# Patient Record
Sex: Male | Born: 1950 | Race: White | Hispanic: No | Smoking: Former smoker
Health system: Southern US, Community
[De-identification: ages and names within clinical notes are randomized; demographics above are authoritative.]

## PROBLEM LIST (undated history)

## (undated) DIAGNOSIS — Z5189 Encounter for other specified aftercare: Secondary | ICD-10-CM

## (undated) DIAGNOSIS — K635 Polyp of colon: Secondary | ICD-10-CM

## (undated) DIAGNOSIS — M109 Gout, unspecified: Secondary | ICD-10-CM

## (undated) DIAGNOSIS — Z720 Tobacco use: Secondary | ICD-10-CM

## (undated) DIAGNOSIS — I10 Essential (primary) hypertension: Secondary | ICD-10-CM

## (undated) DIAGNOSIS — N400 Enlarged prostate without lower urinary tract symptoms: Secondary | ICD-10-CM

## (undated) DIAGNOSIS — H269 Unspecified cataract: Secondary | ICD-10-CM

## (undated) DIAGNOSIS — IMO0002 Reserved for concepts with insufficient information to code with codable children: Secondary | ICD-10-CM

## (undated) DIAGNOSIS — E1142 Type 2 diabetes mellitus with diabetic polyneuropathy: Secondary | ICD-10-CM

## (undated) DIAGNOSIS — E785 Hyperlipidemia, unspecified: Secondary | ICD-10-CM

## (undated) DIAGNOSIS — F329 Major depressive disorder, single episode, unspecified: Secondary | ICD-10-CM

## (undated) DIAGNOSIS — M159 Polyosteoarthritis, unspecified: Secondary | ICD-10-CM

## (undated) DIAGNOSIS — F419 Anxiety disorder, unspecified: Secondary | ICD-10-CM

## (undated) DIAGNOSIS — M5030 Other cervical disc degeneration, unspecified cervical region: Secondary | ICD-10-CM

## (undated) DIAGNOSIS — K219 Gastro-esophageal reflux disease without esophagitis: Secondary | ICD-10-CM

## (undated) DIAGNOSIS — E1165 Type 2 diabetes mellitus with hyperglycemia: Secondary | ICD-10-CM

## (undated) DIAGNOSIS — F32A Depression, unspecified: Secondary | ICD-10-CM

## (undated) HISTORY — PX: SHOULDER SURGERY: SHX246

## (undated) HISTORY — DX: Reserved for concepts with insufficient information to code with codable children: IMO0002

## (undated) HISTORY — DX: Benign prostatic hyperplasia without lower urinary tract symptoms: N40.0

## (undated) HISTORY — DX: Type 2 diabetes mellitus with hyperglycemia: E11.65

## (undated) HISTORY — DX: Depression, unspecified: F32.A

## (undated) HISTORY — DX: Polyp of colon: K63.5

## (undated) HISTORY — DX: Hyperlipidemia, unspecified: E78.5

## (undated) HISTORY — DX: Essential (primary) hypertension: I10

## (undated) HISTORY — DX: Anxiety disorder, unspecified: F41.9

## (undated) HISTORY — DX: Encounter for other specified aftercare: Z51.89

## (undated) HISTORY — DX: Polyosteoarthritis, unspecified: M15.9

## (undated) HISTORY — DX: Gastro-esophageal reflux disease without esophagitis: K21.9

## (undated) HISTORY — DX: Tobacco use: Z72.0

## (undated) HISTORY — DX: Unspecified cataract: H26.9

## (undated) HISTORY — DX: Other cervical disc degeneration, unspecified cervical region: M50.30

## (undated) HISTORY — DX: Gout, unspecified: M10.9

## (undated) HISTORY — DX: Type 2 diabetes mellitus with diabetic polyneuropathy: E11.42

---
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
## 1976-02-04 HISTORY — PX: NASAL SINUS SURGERY: SHX719

## 1996-02-04 HISTORY — PX: HERNIA REPAIR: SHX51

## 1996-02-04 HISTORY — PX: MINOR HEMORRHOIDECTOMY: SHX6238

## 1999-08-15 ENCOUNTER — Encounter: Payer: Self-pay | Admitting: Orthopedic Surgery

## 1999-08-22 ENCOUNTER — Observation Stay (HOSPITAL_COMMUNITY): Admission: RE | Admit: 1999-08-22 | Discharge: 1999-08-23 | Payer: Self-pay | Admitting: Specialist

## 2003-03-18 ENCOUNTER — Ambulatory Visit (HOSPITAL_COMMUNITY): Admission: RE | Admit: 2003-03-18 | Discharge: 2003-03-18 | Payer: Self-pay | Admitting: Orthopedic Surgery

## 2014-02-08 DIAGNOSIS — J309 Allergic rhinitis, unspecified: Secondary | ICD-10-CM | POA: Diagnosis not present

## 2014-02-08 DIAGNOSIS — E1140 Type 2 diabetes mellitus with diabetic neuropathy, unspecified: Secondary | ICD-10-CM | POA: Diagnosis not present

## 2014-02-08 DIAGNOSIS — F329 Major depressive disorder, single episode, unspecified: Secondary | ICD-10-CM | POA: Diagnosis not present

## 2014-02-08 DIAGNOSIS — E785 Hyperlipidemia, unspecified: Secondary | ICD-10-CM | POA: Diagnosis not present

## 2014-02-08 DIAGNOSIS — I10 Essential (primary) hypertension: Secondary | ICD-10-CM | POA: Diagnosis not present

## 2014-03-21 DIAGNOSIS — E1140 Type 2 diabetes mellitus with diabetic neuropathy, unspecified: Secondary | ICD-10-CM | POA: Diagnosis not present

## 2014-03-21 DIAGNOSIS — E785 Hyperlipidemia, unspecified: Secondary | ICD-10-CM | POA: Diagnosis not present

## 2014-03-21 DIAGNOSIS — I10 Essential (primary) hypertension: Secondary | ICD-10-CM | POA: Diagnosis not present

## 2014-03-21 DIAGNOSIS — J0100 Acute maxillary sinusitis, unspecified: Secondary | ICD-10-CM | POA: Diagnosis not present

## 2014-03-21 DIAGNOSIS — J309 Allergic rhinitis, unspecified: Secondary | ICD-10-CM | POA: Diagnosis not present

## 2014-04-20 DIAGNOSIS — E119 Type 2 diabetes mellitus without complications: Secondary | ICD-10-CM | POA: Diagnosis not present

## 2014-04-20 DIAGNOSIS — Z9181 History of falling: Secondary | ICD-10-CM | POA: Diagnosis not present

## 2014-04-20 DIAGNOSIS — J309 Allergic rhinitis, unspecified: Secondary | ICD-10-CM | POA: Diagnosis not present

## 2014-04-20 DIAGNOSIS — E785 Hyperlipidemia, unspecified: Secondary | ICD-10-CM | POA: Diagnosis not present

## 2014-04-20 DIAGNOSIS — I10 Essential (primary) hypertension: Secondary | ICD-10-CM | POA: Diagnosis not present

## 2014-04-20 DIAGNOSIS — E1140 Type 2 diabetes mellitus with diabetic neuropathy, unspecified: Secondary | ICD-10-CM | POA: Diagnosis not present

## 2014-07-21 DIAGNOSIS — J309 Allergic rhinitis, unspecified: Secondary | ICD-10-CM | POA: Diagnosis not present

## 2014-07-21 DIAGNOSIS — Z309 Encounter for contraceptive management, unspecified: Secondary | ICD-10-CM | POA: Diagnosis not present

## 2014-07-21 DIAGNOSIS — I10 Essential (primary) hypertension: Secondary | ICD-10-CM | POA: Diagnosis not present

## 2014-07-21 DIAGNOSIS — E785 Hyperlipidemia, unspecified: Secondary | ICD-10-CM | POA: Diagnosis not present

## 2014-07-21 DIAGNOSIS — E1140 Type 2 diabetes mellitus with diabetic neuropathy, unspecified: Secondary | ICD-10-CM | POA: Diagnosis not present

## 2014-07-21 DIAGNOSIS — E119 Type 2 diabetes mellitus without complications: Secondary | ICD-10-CM | POA: Diagnosis not present

## 2014-09-13 DIAGNOSIS — J209 Acute bronchitis, unspecified: Secondary | ICD-10-CM | POA: Diagnosis not present

## 2014-09-13 DIAGNOSIS — E1140 Type 2 diabetes mellitus with diabetic neuropathy, unspecified: Secondary | ICD-10-CM | POA: Diagnosis not present

## 2014-09-13 DIAGNOSIS — E785 Hyperlipidemia, unspecified: Secondary | ICD-10-CM | POA: Diagnosis not present

## 2014-09-13 DIAGNOSIS — F329 Major depressive disorder, single episode, unspecified: Secondary | ICD-10-CM | POA: Diagnosis not present

## 2014-09-13 DIAGNOSIS — I10 Essential (primary) hypertension: Secondary | ICD-10-CM | POA: Diagnosis not present

## 2014-09-22 DIAGNOSIS — E1140 Type 2 diabetes mellitus with diabetic neuropathy, unspecified: Secondary | ICD-10-CM | POA: Diagnosis not present

## 2014-09-22 DIAGNOSIS — E785 Hyperlipidemia, unspecified: Secondary | ICD-10-CM | POA: Diagnosis not present

## 2014-09-22 DIAGNOSIS — F419 Anxiety disorder, unspecified: Secondary | ICD-10-CM | POA: Diagnosis not present

## 2014-09-22 DIAGNOSIS — F329 Major depressive disorder, single episode, unspecified: Secondary | ICD-10-CM | POA: Diagnosis not present

## 2014-09-22 DIAGNOSIS — I10 Essential (primary) hypertension: Secondary | ICD-10-CM | POA: Diagnosis not present

## 2014-09-29 DIAGNOSIS — F419 Anxiety disorder, unspecified: Secondary | ICD-10-CM | POA: Diagnosis not present

## 2014-09-29 DIAGNOSIS — I10 Essential (primary) hypertension: Secondary | ICD-10-CM | POA: Diagnosis not present

## 2014-09-29 DIAGNOSIS — J209 Acute bronchitis, unspecified: Secondary | ICD-10-CM | POA: Diagnosis not present

## 2014-09-29 DIAGNOSIS — N400 Enlarged prostate without lower urinary tract symptoms: Secondary | ICD-10-CM | POA: Diagnosis not present

## 2014-09-29 DIAGNOSIS — F329 Major depressive disorder, single episode, unspecified: Secondary | ICD-10-CM | POA: Diagnosis not present

## 2014-09-29 DIAGNOSIS — E119 Type 2 diabetes mellitus without complications: Secondary | ICD-10-CM | POA: Diagnosis not present

## 2014-09-29 DIAGNOSIS — E785 Hyperlipidemia, unspecified: Secondary | ICD-10-CM | POA: Diagnosis not present

## 2014-10-17 DIAGNOSIS — E119 Type 2 diabetes mellitus without complications: Secondary | ICD-10-CM | POA: Diagnosis not present

## 2014-10-26 DIAGNOSIS — F329 Major depressive disorder, single episode, unspecified: Secondary | ICD-10-CM | POA: Diagnosis not present

## 2014-10-26 DIAGNOSIS — I10 Essential (primary) hypertension: Secondary | ICD-10-CM | POA: Diagnosis not present

## 2014-10-26 DIAGNOSIS — J209 Acute bronchitis, unspecified: Secondary | ICD-10-CM | POA: Diagnosis not present

## 2014-10-26 DIAGNOSIS — E1140 Type 2 diabetes mellitus with diabetic neuropathy, unspecified: Secondary | ICD-10-CM | POA: Diagnosis not present

## 2014-10-26 DIAGNOSIS — F419 Anxiety disorder, unspecified: Secondary | ICD-10-CM | POA: Diagnosis not present

## 2014-11-13 DIAGNOSIS — Z23 Encounter for immunization: Secondary | ICD-10-CM | POA: Diagnosis not present

## 2014-11-24 DIAGNOSIS — Z1389 Encounter for screening for other disorder: Secondary | ICD-10-CM | POA: Diagnosis not present

## 2014-11-24 DIAGNOSIS — I10 Essential (primary) hypertension: Secondary | ICD-10-CM | POA: Diagnosis not present

## 2014-11-24 DIAGNOSIS — N400 Enlarged prostate without lower urinary tract symptoms: Secondary | ICD-10-CM | POA: Diagnosis not present

## 2014-11-24 DIAGNOSIS — F419 Anxiety disorder, unspecified: Secondary | ICD-10-CM | POA: Diagnosis not present

## 2014-11-24 DIAGNOSIS — F329 Major depressive disorder, single episode, unspecified: Secondary | ICD-10-CM | POA: Diagnosis not present

## 2015-01-22 DIAGNOSIS — M109 Gout, unspecified: Secondary | ICD-10-CM | POA: Diagnosis not present

## 2015-01-22 DIAGNOSIS — E785 Hyperlipidemia, unspecified: Secondary | ICD-10-CM | POA: Diagnosis not present

## 2015-01-22 DIAGNOSIS — E119 Type 2 diabetes mellitus without complications: Secondary | ICD-10-CM | POA: Diagnosis not present

## 2015-01-22 DIAGNOSIS — F329 Major depressive disorder, single episode, unspecified: Secondary | ICD-10-CM | POA: Diagnosis not present

## 2015-01-22 DIAGNOSIS — F419 Anxiety disorder, unspecified: Secondary | ICD-10-CM | POA: Diagnosis not present

## 2015-01-22 DIAGNOSIS — N400 Enlarged prostate without lower urinary tract symptoms: Secondary | ICD-10-CM | POA: Diagnosis not present

## 2015-01-22 DIAGNOSIS — I10 Essential (primary) hypertension: Secondary | ICD-10-CM | POA: Diagnosis not present

## 2015-03-22 DIAGNOSIS — I10 Essential (primary) hypertension: Secondary | ICD-10-CM | POA: Diagnosis not present

## 2015-03-22 DIAGNOSIS — J309 Allergic rhinitis, unspecified: Secondary | ICD-10-CM | POA: Diagnosis not present

## 2015-03-22 DIAGNOSIS — M109 Gout, unspecified: Secondary | ICD-10-CM | POA: Diagnosis not present

## 2015-03-22 DIAGNOSIS — N400 Enlarged prostate without lower urinary tract symptoms: Secondary | ICD-10-CM | POA: Diagnosis not present

## 2015-09-29 DIAGNOSIS — E1140 Type 2 diabetes mellitus with diabetic neuropathy, unspecified: Secondary | ICD-10-CM | POA: Diagnosis not present

## 2015-09-29 DIAGNOSIS — K635 Polyp of colon: Secondary | ICD-10-CM | POA: Diagnosis not present

## 2015-09-29 DIAGNOSIS — E785 Hyperlipidemia, unspecified: Secondary | ICD-10-CM | POA: Diagnosis not present

## 2015-09-29 DIAGNOSIS — I10 Essential (primary) hypertension: Secondary | ICD-10-CM | POA: Diagnosis not present

## 2015-10-18 DIAGNOSIS — E119 Type 2 diabetes mellitus without complications: Secondary | ICD-10-CM | POA: Diagnosis not present

## 2015-11-06 DIAGNOSIS — Z23 Encounter for immunization: Secondary | ICD-10-CM | POA: Diagnosis not present

## 2015-11-23 DIAGNOSIS — E1140 Type 2 diabetes mellitus with diabetic neuropathy, unspecified: Secondary | ICD-10-CM | POA: Diagnosis not present

## 2015-11-23 DIAGNOSIS — L270 Generalized skin eruption due to drugs and medicaments taken internally: Secondary | ICD-10-CM | POA: Diagnosis not present

## 2015-12-18 DIAGNOSIS — J019 Acute sinusitis, unspecified: Secondary | ICD-10-CM | POA: Diagnosis not present

## 2016-01-03 DIAGNOSIS — Z125 Encounter for screening for malignant neoplasm of prostate: Secondary | ICD-10-CM | POA: Diagnosis not present

## 2016-01-03 DIAGNOSIS — E1140 Type 2 diabetes mellitus with diabetic neuropathy, unspecified: Secondary | ICD-10-CM | POA: Diagnosis not present

## 2016-01-03 DIAGNOSIS — I10 Essential (primary) hypertension: Secondary | ICD-10-CM | POA: Diagnosis not present

## 2016-01-03 DIAGNOSIS — Z1211 Encounter for screening for malignant neoplasm of colon: Secondary | ICD-10-CM | POA: Diagnosis not present

## 2016-01-03 DIAGNOSIS — Z1389 Encounter for screening for other disorder: Secondary | ICD-10-CM | POA: Diagnosis not present

## 2016-01-03 DIAGNOSIS — Z9181 History of falling: Secondary | ICD-10-CM | POA: Diagnosis not present

## 2016-01-03 DIAGNOSIS — E785 Hyperlipidemia, unspecified: Secondary | ICD-10-CM | POA: Diagnosis not present

## 2016-01-25 DIAGNOSIS — E1140 Type 2 diabetes mellitus with diabetic neuropathy, unspecified: Secondary | ICD-10-CM | POA: Diagnosis not present

## 2016-01-25 DIAGNOSIS — E1165 Type 2 diabetes mellitus with hyperglycemia: Secondary | ICD-10-CM | POA: Diagnosis not present

## 2016-02-11 DIAGNOSIS — Z1211 Encounter for screening for malignant neoplasm of colon: Secondary | ICD-10-CM | POA: Diagnosis not present

## 2016-02-11 DIAGNOSIS — Z86010 Personal history of colonic polyps: Secondary | ICD-10-CM | POA: Diagnosis not present

## 2016-02-11 DIAGNOSIS — R160 Hepatomegaly, not elsewhere classified: Secondary | ICD-10-CM | POA: Diagnosis not present

## 2016-02-16 DIAGNOSIS — N289 Disorder of kidney and ureter, unspecified: Secondary | ICD-10-CM | POA: Diagnosis not present

## 2016-02-16 DIAGNOSIS — R160 Hepatomegaly, not elsewhere classified: Secondary | ICD-10-CM | POA: Diagnosis not present

## 2016-02-16 DIAGNOSIS — K760 Fatty (change of) liver, not elsewhere classified: Secondary | ICD-10-CM | POA: Diagnosis not present

## 2016-03-10 DIAGNOSIS — K760 Fatty (change of) liver, not elsewhere classified: Secondary | ICD-10-CM | POA: Diagnosis not present

## 2016-03-10 DIAGNOSIS — N281 Cyst of kidney, acquired: Secondary | ICD-10-CM | POA: Diagnosis not present

## 2016-03-10 DIAGNOSIS — D4102 Neoplasm of uncertain behavior of left kidney: Secondary | ICD-10-CM | POA: Diagnosis not present

## 2016-03-10 DIAGNOSIS — Q433 Congenital malformations of intestinal fixation: Secondary | ICD-10-CM | POA: Diagnosis not present

## 2016-04-01 DIAGNOSIS — Z6837 Body mass index (BMI) 37.0-37.9, adult: Secondary | ICD-10-CM | POA: Diagnosis not present

## 2016-04-01 DIAGNOSIS — J019 Acute sinusitis, unspecified: Secondary | ICD-10-CM | POA: Diagnosis not present

## 2016-04-01 DIAGNOSIS — J208 Acute bronchitis due to other specified organisms: Secondary | ICD-10-CM | POA: Diagnosis not present

## 2016-04-02 DIAGNOSIS — E1140 Type 2 diabetes mellitus with diabetic neuropathy, unspecified: Secondary | ICD-10-CM | POA: Diagnosis not present

## 2016-04-02 DIAGNOSIS — E785 Hyperlipidemia, unspecified: Secondary | ICD-10-CM | POA: Diagnosis not present

## 2016-04-02 DIAGNOSIS — I10 Essential (primary) hypertension: Secondary | ICD-10-CM | POA: Diagnosis not present

## 2016-04-02 DIAGNOSIS — Z139 Encounter for screening, unspecified: Secondary | ICD-10-CM | POA: Diagnosis not present

## 2016-04-02 DIAGNOSIS — Z6837 Body mass index (BMI) 37.0-37.9, adult: Secondary | ICD-10-CM | POA: Diagnosis not present

## 2016-04-14 DIAGNOSIS — K580 Irritable bowel syndrome with diarrhea: Secondary | ICD-10-CM | POA: Diagnosis not present

## 2016-04-14 DIAGNOSIS — Z6836 Body mass index (BMI) 36.0-36.9, adult: Secondary | ICD-10-CM | POA: Diagnosis not present

## 2016-04-14 DIAGNOSIS — J019 Acute sinusitis, unspecified: Secondary | ICD-10-CM | POA: Diagnosis not present

## 2016-04-17 DIAGNOSIS — H25813 Combined forms of age-related cataract, bilateral: Secondary | ICD-10-CM | POA: Diagnosis not present

## 2016-04-17 DIAGNOSIS — H353131 Nonexudative age-related macular degeneration, bilateral, early dry stage: Secondary | ICD-10-CM | POA: Diagnosis not present

## 2016-04-17 DIAGNOSIS — E119 Type 2 diabetes mellitus without complications: Secondary | ICD-10-CM | POA: Diagnosis not present

## 2016-05-08 DIAGNOSIS — J309 Allergic rhinitis, unspecified: Secondary | ICD-10-CM | POA: Diagnosis not present

## 2016-05-08 DIAGNOSIS — J019 Acute sinusitis, unspecified: Secondary | ICD-10-CM | POA: Diagnosis not present

## 2016-05-08 DIAGNOSIS — R05 Cough: Secondary | ICD-10-CM | POA: Diagnosis not present

## 2016-05-08 DIAGNOSIS — R042 Hemoptysis: Secondary | ICD-10-CM | POA: Diagnosis not present

## 2016-05-08 DIAGNOSIS — E669 Obesity, unspecified: Secondary | ICD-10-CM | POA: Diagnosis not present

## 2016-05-12 DIAGNOSIS — Z1211 Encounter for screening for malignant neoplasm of colon: Secondary | ICD-10-CM | POA: Diagnosis not present

## 2016-05-12 DIAGNOSIS — D125 Benign neoplasm of sigmoid colon: Secondary | ICD-10-CM | POA: Diagnosis not present

## 2016-05-12 DIAGNOSIS — D123 Benign neoplasm of transverse colon: Secondary | ICD-10-CM | POA: Diagnosis not present

## 2016-05-12 DIAGNOSIS — K5730 Diverticulosis of large intestine without perforation or abscess without bleeding: Secondary | ICD-10-CM | POA: Diagnosis not present

## 2016-05-12 DIAGNOSIS — K635 Polyp of colon: Secondary | ICD-10-CM | POA: Diagnosis not present

## 2016-05-12 DIAGNOSIS — E119 Type 2 diabetes mellitus without complications: Secondary | ICD-10-CM | POA: Diagnosis not present

## 2016-05-12 DIAGNOSIS — K648 Other hemorrhoids: Secondary | ICD-10-CM | POA: Diagnosis not present

## 2016-05-12 DIAGNOSIS — Z800 Family history of malignant neoplasm of digestive organs: Secondary | ICD-10-CM | POA: Diagnosis not present

## 2016-05-12 DIAGNOSIS — Z86010 Personal history of colonic polyps: Secondary | ICD-10-CM | POA: Diagnosis not present

## 2016-07-30 DIAGNOSIS — I10 Essential (primary) hypertension: Secondary | ICD-10-CM | POA: Diagnosis not present

## 2016-07-30 DIAGNOSIS — Z6838 Body mass index (BMI) 38.0-38.9, adult: Secondary | ICD-10-CM | POA: Diagnosis not present

## 2016-07-30 DIAGNOSIS — E1140 Type 2 diabetes mellitus with diabetic neuropathy, unspecified: Secondary | ICD-10-CM | POA: Diagnosis not present

## 2016-07-30 DIAGNOSIS — E785 Hyperlipidemia, unspecified: Secondary | ICD-10-CM | POA: Diagnosis not present

## 2016-07-30 DIAGNOSIS — K219 Gastro-esophageal reflux disease without esophagitis: Secondary | ICD-10-CM | POA: Diagnosis not present

## 2016-07-31 DIAGNOSIS — E785 Hyperlipidemia, unspecified: Secondary | ICD-10-CM | POA: Diagnosis not present

## 2016-07-31 DIAGNOSIS — E1140 Type 2 diabetes mellitus with diabetic neuropathy, unspecified: Secondary | ICD-10-CM | POA: Diagnosis not present

## 2016-09-29 DIAGNOSIS — J019 Acute sinusitis, unspecified: Secondary | ICD-10-CM | POA: Diagnosis not present

## 2016-09-29 DIAGNOSIS — Z6838 Body mass index (BMI) 38.0-38.9, adult: Secondary | ICD-10-CM | POA: Diagnosis not present

## 2016-10-14 DIAGNOSIS — J019 Acute sinusitis, unspecified: Secondary | ICD-10-CM | POA: Diagnosis not present

## 2016-10-14 DIAGNOSIS — Z6838 Body mass index (BMI) 38.0-38.9, adult: Secondary | ICD-10-CM | POA: Diagnosis not present

## 2016-10-31 DIAGNOSIS — E1140 Type 2 diabetes mellitus with diabetic neuropathy, unspecified: Secondary | ICD-10-CM | POA: Diagnosis not present

## 2016-10-31 DIAGNOSIS — Z6838 Body mass index (BMI) 38.0-38.9, adult: Secondary | ICD-10-CM | POA: Diagnosis not present

## 2016-10-31 DIAGNOSIS — Z139 Encounter for screening, unspecified: Secondary | ICD-10-CM | POA: Diagnosis not present

## 2016-10-31 DIAGNOSIS — Z9181 History of falling: Secondary | ICD-10-CM | POA: Diagnosis not present

## 2016-10-31 DIAGNOSIS — E785 Hyperlipidemia, unspecified: Secondary | ICD-10-CM | POA: Diagnosis not present

## 2016-10-31 DIAGNOSIS — Z23 Encounter for immunization: Secondary | ICD-10-CM | POA: Diagnosis not present

## 2016-10-31 DIAGNOSIS — K219 Gastro-esophageal reflux disease without esophagitis: Secondary | ICD-10-CM | POA: Diagnosis not present

## 2016-10-31 DIAGNOSIS — I10 Essential (primary) hypertension: Secondary | ICD-10-CM | POA: Diagnosis not present

## 2016-11-06 DIAGNOSIS — Z6838 Body mass index (BMI) 38.0-38.9, adult: Secondary | ICD-10-CM | POA: Diagnosis not present

## 2016-11-06 DIAGNOSIS — Z9181 History of falling: Secondary | ICD-10-CM | POA: Diagnosis not present

## 2016-11-06 DIAGNOSIS — Z136 Encounter for screening for cardiovascular disorders: Secondary | ICD-10-CM | POA: Diagnosis not present

## 2016-11-06 DIAGNOSIS — Z125 Encounter for screening for malignant neoplasm of prostate: Secondary | ICD-10-CM | POA: Diagnosis not present

## 2016-11-06 DIAGNOSIS — E785 Hyperlipidemia, unspecified: Secondary | ICD-10-CM | POA: Diagnosis not present

## 2016-11-06 DIAGNOSIS — Z1389 Encounter for screening for other disorder: Secondary | ICD-10-CM | POA: Diagnosis not present

## 2016-11-06 DIAGNOSIS — E669 Obesity, unspecified: Secondary | ICD-10-CM | POA: Diagnosis not present

## 2016-11-06 DIAGNOSIS — Z0000 Encounter for general adult medical examination without abnormal findings: Secondary | ICD-10-CM | POA: Diagnosis not present

## 2016-11-26 DIAGNOSIS — J019 Acute sinusitis, unspecified: Secondary | ICD-10-CM | POA: Diagnosis not present

## 2016-11-26 DIAGNOSIS — Z6839 Body mass index (BMI) 39.0-39.9, adult: Secondary | ICD-10-CM | POA: Diagnosis not present

## 2016-12-04 DIAGNOSIS — I10 Essential (primary) hypertension: Secondary | ICD-10-CM | POA: Diagnosis not present

## 2016-12-04 DIAGNOSIS — R05 Cough: Secondary | ICD-10-CM | POA: Diagnosis not present

## 2016-12-04 DIAGNOSIS — Z6838 Body mass index (BMI) 38.0-38.9, adult: Secondary | ICD-10-CM | POA: Diagnosis not present

## 2016-12-23 DIAGNOSIS — R0981 Nasal congestion: Secondary | ICD-10-CM | POA: Diagnosis not present

## 2016-12-23 DIAGNOSIS — I10 Essential (primary) hypertension: Secondary | ICD-10-CM | POA: Diagnosis not present

## 2016-12-23 DIAGNOSIS — Z6838 Body mass index (BMI) 38.0-38.9, adult: Secondary | ICD-10-CM | POA: Diagnosis not present

## 2017-01-30 DIAGNOSIS — E785 Hyperlipidemia, unspecified: Secondary | ICD-10-CM | POA: Diagnosis not present

## 2017-01-30 DIAGNOSIS — Z125 Encounter for screening for malignant neoplasm of prostate: Secondary | ICD-10-CM | POA: Diagnosis not present

## 2017-01-30 DIAGNOSIS — E1140 Type 2 diabetes mellitus with diabetic neuropathy, unspecified: Secondary | ICD-10-CM | POA: Diagnosis not present

## 2017-03-02 DIAGNOSIS — M542 Cervicalgia: Secondary | ICD-10-CM | POA: Diagnosis not present

## 2017-03-02 DIAGNOSIS — M5416 Radiculopathy, lumbar region: Secondary | ICD-10-CM | POA: Diagnosis not present

## 2017-03-02 DIAGNOSIS — M545 Low back pain: Secondary | ICD-10-CM | POA: Diagnosis not present

## 2017-03-02 DIAGNOSIS — M5090 Cervical disc disorder, unspecified, unspecified cervical region: Secondary | ICD-10-CM | POA: Diagnosis not present

## 2017-03-03 DIAGNOSIS — R2689 Other abnormalities of gait and mobility: Secondary | ICD-10-CM | POA: Diagnosis not present

## 2017-03-03 DIAGNOSIS — M545 Low back pain: Secondary | ICD-10-CM | POA: Diagnosis not present

## 2017-03-03 DIAGNOSIS — M6281 Muscle weakness (generalized): Secondary | ICD-10-CM | POA: Diagnosis not present

## 2017-03-06 DIAGNOSIS — M545 Low back pain: Secondary | ICD-10-CM | POA: Diagnosis not present

## 2017-03-06 DIAGNOSIS — R2689 Other abnormalities of gait and mobility: Secondary | ICD-10-CM | POA: Diagnosis not present

## 2017-03-06 DIAGNOSIS — M6281 Muscle weakness (generalized): Secondary | ICD-10-CM | POA: Diagnosis not present

## 2017-03-10 DIAGNOSIS — R2689 Other abnormalities of gait and mobility: Secondary | ICD-10-CM | POA: Diagnosis not present

## 2017-03-10 DIAGNOSIS — M6281 Muscle weakness (generalized): Secondary | ICD-10-CM | POA: Diagnosis not present

## 2017-03-10 DIAGNOSIS — M545 Low back pain: Secondary | ICD-10-CM | POA: Diagnosis not present

## 2017-03-12 DIAGNOSIS — R2689 Other abnormalities of gait and mobility: Secondary | ICD-10-CM | POA: Diagnosis not present

## 2017-03-12 DIAGNOSIS — M6281 Muscle weakness (generalized): Secondary | ICD-10-CM | POA: Diagnosis not present

## 2017-03-12 DIAGNOSIS — M545 Low back pain: Secondary | ICD-10-CM | POA: Diagnosis not present

## 2017-03-19 DIAGNOSIS — R2689 Other abnormalities of gait and mobility: Secondary | ICD-10-CM | POA: Diagnosis not present

## 2017-03-19 DIAGNOSIS — M6281 Muscle weakness (generalized): Secondary | ICD-10-CM | POA: Diagnosis not present

## 2017-03-19 DIAGNOSIS — M545 Low back pain: Secondary | ICD-10-CM | POA: Diagnosis not present

## 2017-03-24 DIAGNOSIS — M545 Low back pain: Secondary | ICD-10-CM | POA: Diagnosis not present

## 2017-03-24 DIAGNOSIS — R2689 Other abnormalities of gait and mobility: Secondary | ICD-10-CM | POA: Diagnosis not present

## 2017-03-24 DIAGNOSIS — M6281 Muscle weakness (generalized): Secondary | ICD-10-CM | POA: Diagnosis not present

## 2017-03-26 DIAGNOSIS — M545 Low back pain: Secondary | ICD-10-CM | POA: Diagnosis not present

## 2017-03-26 DIAGNOSIS — M6281 Muscle weakness (generalized): Secondary | ICD-10-CM | POA: Diagnosis not present

## 2017-03-26 DIAGNOSIS — R2689 Other abnormalities of gait and mobility: Secondary | ICD-10-CM | POA: Diagnosis not present

## 2017-03-31 DIAGNOSIS — M6281 Muscle weakness (generalized): Secondary | ICD-10-CM | POA: Diagnosis not present

## 2017-03-31 DIAGNOSIS — M545 Low back pain: Secondary | ICD-10-CM | POA: Diagnosis not present

## 2017-03-31 DIAGNOSIS — R2689 Other abnormalities of gait and mobility: Secondary | ICD-10-CM | POA: Diagnosis not present

## 2017-04-01 DIAGNOSIS — R600 Localized edema: Secondary | ICD-10-CM | POA: Diagnosis not present

## 2017-04-01 DIAGNOSIS — I10 Essential (primary) hypertension: Secondary | ICD-10-CM | POA: Diagnosis not present

## 2017-04-02 DIAGNOSIS — M545 Low back pain: Secondary | ICD-10-CM | POA: Diagnosis not present

## 2017-04-02 DIAGNOSIS — R2689 Other abnormalities of gait and mobility: Secondary | ICD-10-CM | POA: Diagnosis not present

## 2017-04-02 DIAGNOSIS — M6281 Muscle weakness (generalized): Secondary | ICD-10-CM | POA: Diagnosis not present

## 2017-04-06 DIAGNOSIS — R2689 Other abnormalities of gait and mobility: Secondary | ICD-10-CM | POA: Diagnosis not present

## 2017-04-06 DIAGNOSIS — M545 Low back pain: Secondary | ICD-10-CM | POA: Diagnosis not present

## 2017-04-06 DIAGNOSIS — M6281 Muscle weakness (generalized): Secondary | ICD-10-CM | POA: Diagnosis not present

## 2017-04-08 DIAGNOSIS — M6281 Muscle weakness (generalized): Secondary | ICD-10-CM | POA: Diagnosis not present

## 2017-04-08 DIAGNOSIS — R2689 Other abnormalities of gait and mobility: Secondary | ICD-10-CM | POA: Diagnosis not present

## 2017-04-08 DIAGNOSIS — M545 Low back pain: Secondary | ICD-10-CM | POA: Diagnosis not present

## 2017-04-13 DIAGNOSIS — M545 Low back pain: Secondary | ICD-10-CM | POA: Diagnosis not present

## 2017-04-13 DIAGNOSIS — M6281 Muscle weakness (generalized): Secondary | ICD-10-CM | POA: Diagnosis not present

## 2017-04-13 DIAGNOSIS — R2689 Other abnormalities of gait and mobility: Secondary | ICD-10-CM | POA: Diagnosis not present

## 2017-04-15 DIAGNOSIS — M545 Low back pain: Secondary | ICD-10-CM | POA: Diagnosis not present

## 2017-04-15 DIAGNOSIS — R2689 Other abnormalities of gait and mobility: Secondary | ICD-10-CM | POA: Diagnosis not present

## 2017-04-15 DIAGNOSIS — M6281 Muscle weakness (generalized): Secondary | ICD-10-CM | POA: Diagnosis not present

## 2017-04-28 DIAGNOSIS — Z6839 Body mass index (BMI) 39.0-39.9, adult: Secondary | ICD-10-CM | POA: Diagnosis not present

## 2017-04-28 DIAGNOSIS — R600 Localized edema: Secondary | ICD-10-CM | POA: Diagnosis not present

## 2017-04-28 DIAGNOSIS — I10 Essential (primary) hypertension: Secondary | ICD-10-CM | POA: Diagnosis not present

## 2017-04-28 DIAGNOSIS — Z139 Encounter for screening, unspecified: Secondary | ICD-10-CM | POA: Diagnosis not present

## 2017-04-28 DIAGNOSIS — I890 Lymphedema, not elsewhere classified: Secondary | ICD-10-CM | POA: Diagnosis not present

## 2017-04-29 DIAGNOSIS — I872 Venous insufficiency (chronic) (peripheral): Secondary | ICD-10-CM | POA: Diagnosis not present

## 2017-04-29 DIAGNOSIS — E119 Type 2 diabetes mellitus without complications: Secondary | ICD-10-CM | POA: Diagnosis not present

## 2017-04-29 DIAGNOSIS — I10 Essential (primary) hypertension: Secondary | ICD-10-CM | POA: Diagnosis not present

## 2017-04-29 DIAGNOSIS — I890 Lymphedema, not elsewhere classified: Secondary | ICD-10-CM | POA: Diagnosis not present

## 2017-05-04 DIAGNOSIS — J019 Acute sinusitis, unspecified: Secondary | ICD-10-CM | POA: Diagnosis not present

## 2017-05-04 DIAGNOSIS — J208 Acute bronchitis due to other specified organisms: Secondary | ICD-10-CM | POA: Diagnosis not present

## 2017-05-05 DIAGNOSIS — E1140 Type 2 diabetes mellitus with diabetic neuropathy, unspecified: Secondary | ICD-10-CM | POA: Diagnosis not present

## 2017-05-05 DIAGNOSIS — E785 Hyperlipidemia, unspecified: Secondary | ICD-10-CM | POA: Diagnosis not present

## 2017-05-05 DIAGNOSIS — I10 Essential (primary) hypertension: Secondary | ICD-10-CM | POA: Diagnosis not present

## 2017-05-05 DIAGNOSIS — R600 Localized edema: Secondary | ICD-10-CM | POA: Diagnosis not present

## 2017-05-05 DIAGNOSIS — Z6839 Body mass index (BMI) 39.0-39.9, adult: Secondary | ICD-10-CM | POA: Diagnosis not present

## 2017-05-06 DIAGNOSIS — I890 Lymphedema, not elsewhere classified: Secondary | ICD-10-CM | POA: Diagnosis not present

## 2017-05-06 DIAGNOSIS — M7989 Other specified soft tissue disorders: Secondary | ICD-10-CM | POA: Diagnosis not present

## 2017-05-07 DIAGNOSIS — E119 Type 2 diabetes mellitus without complications: Secondary | ICD-10-CM | POA: Diagnosis not present

## 2017-05-07 DIAGNOSIS — I10 Essential (primary) hypertension: Secondary | ICD-10-CM | POA: Diagnosis not present

## 2017-05-07 DIAGNOSIS — I890 Lymphedema, not elsewhere classified: Secondary | ICD-10-CM | POA: Diagnosis not present

## 2017-05-07 DIAGNOSIS — I872 Venous insufficiency (chronic) (peripheral): Secondary | ICD-10-CM | POA: Diagnosis not present

## 2017-05-15 ENCOUNTER — Encounter: Payer: Self-pay | Admitting: Gastroenterology

## 2017-05-29 DIAGNOSIS — I10 Essential (primary) hypertension: Secondary | ICD-10-CM | POA: Insufficient documentation

## 2017-05-29 DIAGNOSIS — E785 Hyperlipidemia, unspecified: Secondary | ICD-10-CM | POA: Insufficient documentation

## 2017-05-29 DIAGNOSIS — E119 Type 2 diabetes mellitus without complications: Secondary | ICD-10-CM | POA: Insufficient documentation

## 2017-05-29 DIAGNOSIS — Z0000 Encounter for general adult medical examination without abnormal findings: Secondary | ICD-10-CM | POA: Diagnosis not present

## 2017-05-29 DIAGNOSIS — E118 Type 2 diabetes mellitus with unspecified complications: Secondary | ICD-10-CM | POA: Insufficient documentation

## 2017-05-29 DIAGNOSIS — I509 Heart failure, unspecified: Secondary | ICD-10-CM | POA: Diagnosis not present

## 2017-05-29 DIAGNOSIS — I499 Cardiac arrhythmia, unspecified: Secondary | ICD-10-CM | POA: Diagnosis not present

## 2017-05-29 DIAGNOSIS — R609 Edema, unspecified: Secondary | ICD-10-CM

## 2017-05-29 DIAGNOSIS — R9431 Abnormal electrocardiogram [ECG] [EKG]: Secondary | ICD-10-CM | POA: Diagnosis not present

## 2017-05-29 DIAGNOSIS — I110 Hypertensive heart disease with heart failure: Secondary | ICD-10-CM | POA: Diagnosis not present

## 2017-05-29 DIAGNOSIS — I440 Atrioventricular block, first degree: Secondary | ICD-10-CM | POA: Diagnosis not present

## 2017-05-29 HISTORY — DX: Hyperlipidemia, unspecified: E78.5

## 2017-05-29 HISTORY — DX: Edema, unspecified: R60.9

## 2017-05-29 HISTORY — DX: Type 2 diabetes mellitus without complications: E11.9

## 2017-05-29 HISTORY — DX: Essential (primary) hypertension: I10

## 2017-06-15 DIAGNOSIS — R609 Edema, unspecified: Secondary | ICD-10-CM | POA: Diagnosis not present

## 2017-06-15 DIAGNOSIS — I10 Essential (primary) hypertension: Secondary | ICD-10-CM | POA: Diagnosis not present

## 2017-06-22 DIAGNOSIS — H6691 Otitis media, unspecified, right ear: Secondary | ICD-10-CM | POA: Diagnosis not present

## 2017-06-23 DIAGNOSIS — E119 Type 2 diabetes mellitus without complications: Secondary | ICD-10-CM | POA: Diagnosis not present

## 2017-06-23 DIAGNOSIS — H353131 Nonexudative age-related macular degeneration, bilateral, early dry stage: Secondary | ICD-10-CM | POA: Diagnosis not present

## 2017-06-23 DIAGNOSIS — H25813 Combined forms of age-related cataract, bilateral: Secondary | ICD-10-CM | POA: Diagnosis not present

## 2017-07-17 DIAGNOSIS — R609 Edema, unspecified: Secondary | ICD-10-CM | POA: Diagnosis not present

## 2017-07-17 DIAGNOSIS — R0602 Shortness of breath: Secondary | ICD-10-CM | POA: Insufficient documentation

## 2017-07-17 HISTORY — DX: Shortness of breath: R06.02

## 2017-07-21 DIAGNOSIS — R079 Chest pain, unspecified: Secondary | ICD-10-CM | POA: Diagnosis not present

## 2017-07-21 DIAGNOSIS — R0602 Shortness of breath: Secondary | ICD-10-CM | POA: Diagnosis not present

## 2017-08-13 DIAGNOSIS — E1140 Type 2 diabetes mellitus with diabetic neuropathy, unspecified: Secondary | ICD-10-CM | POA: Diagnosis not present

## 2017-08-13 DIAGNOSIS — E785 Hyperlipidemia, unspecified: Secondary | ICD-10-CM | POA: Diagnosis not present

## 2017-08-13 DIAGNOSIS — Z6838 Body mass index (BMI) 38.0-38.9, adult: Secondary | ICD-10-CM | POA: Diagnosis not present

## 2017-08-13 DIAGNOSIS — I10 Essential (primary) hypertension: Secondary | ICD-10-CM | POA: Diagnosis not present

## 2017-08-13 DIAGNOSIS — Z1339 Encounter for screening examination for other mental health and behavioral disorders: Secondary | ICD-10-CM | POA: Diagnosis not present

## 2017-08-13 DIAGNOSIS — R600 Localized edema: Secondary | ICD-10-CM | POA: Diagnosis not present

## 2017-09-01 DIAGNOSIS — Z6839 Body mass index (BMI) 39.0-39.9, adult: Secondary | ICD-10-CM | POA: Diagnosis not present

## 2017-09-01 DIAGNOSIS — E669 Obesity, unspecified: Secondary | ICD-10-CM | POA: Diagnosis not present

## 2017-09-01 DIAGNOSIS — J019 Acute sinusitis, unspecified: Secondary | ICD-10-CM | POA: Diagnosis not present

## 2017-09-15 DIAGNOSIS — Z6838 Body mass index (BMI) 38.0-38.9, adult: Secondary | ICD-10-CM | POA: Diagnosis not present

## 2017-09-15 DIAGNOSIS — B379 Candidiasis, unspecified: Secondary | ICD-10-CM | POA: Diagnosis not present

## 2017-09-22 DIAGNOSIS — G4733 Obstructive sleep apnea (adult) (pediatric): Secondary | ICD-10-CM | POA: Diagnosis not present

## 2017-09-22 DIAGNOSIS — R0982 Postnasal drip: Secondary | ICD-10-CM | POA: Diagnosis not present

## 2017-09-23 DIAGNOSIS — Z79899 Other long term (current) drug therapy: Secondary | ICD-10-CM | POA: Diagnosis not present

## 2017-09-23 DIAGNOSIS — M542 Cervicalgia: Secondary | ICD-10-CM | POA: Diagnosis not present

## 2017-09-23 DIAGNOSIS — J069 Acute upper respiratory infection, unspecified: Secondary | ICD-10-CM | POA: Diagnosis not present

## 2017-09-29 DIAGNOSIS — G4733 Obstructive sleep apnea (adult) (pediatric): Secondary | ICD-10-CM | POA: Diagnosis not present

## 2017-09-29 DIAGNOSIS — R0602 Shortness of breath: Secondary | ICD-10-CM | POA: Diagnosis not present

## 2017-09-30 DIAGNOSIS — R0602 Shortness of breath: Secondary | ICD-10-CM | POA: Diagnosis not present

## 2017-09-30 DIAGNOSIS — G4733 Obstructive sleep apnea (adult) (pediatric): Secondary | ICD-10-CM | POA: Diagnosis not present

## 2017-10-12 DIAGNOSIS — G4733 Obstructive sleep apnea (adult) (pediatric): Secondary | ICD-10-CM | POA: Diagnosis not present

## 2017-11-03 DIAGNOSIS — Z23 Encounter for immunization: Secondary | ICD-10-CM | POA: Diagnosis not present

## 2017-11-09 DIAGNOSIS — Z1331 Encounter for screening for depression: Secondary | ICD-10-CM | POA: Diagnosis not present

## 2017-11-09 DIAGNOSIS — Z6841 Body Mass Index (BMI) 40.0 and over, adult: Secondary | ICD-10-CM | POA: Diagnosis not present

## 2017-11-09 DIAGNOSIS — Z125 Encounter for screening for malignant neoplasm of prostate: Secondary | ICD-10-CM | POA: Diagnosis not present

## 2017-11-09 DIAGNOSIS — Z9181 History of falling: Secondary | ICD-10-CM | POA: Diagnosis not present

## 2017-11-09 DIAGNOSIS — Z0000 Encounter for general adult medical examination without abnormal findings: Secondary | ICD-10-CM | POA: Diagnosis not present

## 2017-11-09 DIAGNOSIS — E1140 Type 2 diabetes mellitus with diabetic neuropathy, unspecified: Secondary | ICD-10-CM | POA: Diagnosis not present

## 2017-11-09 DIAGNOSIS — E785 Hyperlipidemia, unspecified: Secondary | ICD-10-CM | POA: Diagnosis not present

## 2017-11-18 DIAGNOSIS — E1140 Type 2 diabetes mellitus with diabetic neuropathy, unspecified: Secondary | ICD-10-CM | POA: Diagnosis not present

## 2017-11-18 DIAGNOSIS — E1165 Type 2 diabetes mellitus with hyperglycemia: Secondary | ICD-10-CM | POA: Diagnosis not present

## 2017-11-18 DIAGNOSIS — I10 Essential (primary) hypertension: Secondary | ICD-10-CM | POA: Diagnosis not present

## 2017-11-18 DIAGNOSIS — Z794 Long term (current) use of insulin: Secondary | ICD-10-CM | POA: Diagnosis not present

## 2017-11-18 DIAGNOSIS — E785 Hyperlipidemia, unspecified: Secondary | ICD-10-CM | POA: Diagnosis not present

## 2017-11-18 DIAGNOSIS — K529 Noninfective gastroenteritis and colitis, unspecified: Secondary | ICD-10-CM | POA: Diagnosis not present

## 2017-11-18 DIAGNOSIS — R600 Localized edema: Secondary | ICD-10-CM | POA: Diagnosis not present

## 2017-11-18 DIAGNOSIS — Z6841 Body Mass Index (BMI) 40.0 and over, adult: Secondary | ICD-10-CM | POA: Diagnosis not present

## 2018-01-18 DIAGNOSIS — R609 Edema, unspecified: Secondary | ICD-10-CM | POA: Diagnosis not present

## 2018-02-15 DIAGNOSIS — J019 Acute sinusitis, unspecified: Secondary | ICD-10-CM | POA: Diagnosis not present

## 2018-02-15 DIAGNOSIS — R6889 Other general symptoms and signs: Secondary | ICD-10-CM | POA: Diagnosis not present

## 2018-02-15 DIAGNOSIS — J208 Acute bronchitis due to other specified organisms: Secondary | ICD-10-CM | POA: Diagnosis not present

## 2018-02-25 DIAGNOSIS — I10 Essential (primary) hypertension: Secondary | ICD-10-CM | POA: Diagnosis not present

## 2018-02-25 DIAGNOSIS — E1140 Type 2 diabetes mellitus with diabetic neuropathy, unspecified: Secondary | ICD-10-CM | POA: Diagnosis not present

## 2018-02-25 DIAGNOSIS — E785 Hyperlipidemia, unspecified: Secondary | ICD-10-CM | POA: Diagnosis not present

## 2018-02-25 DIAGNOSIS — R600 Localized edema: Secondary | ICD-10-CM | POA: Diagnosis not present

## 2018-03-02 DIAGNOSIS — J208 Acute bronchitis due to other specified organisms: Secondary | ICD-10-CM | POA: Diagnosis not present

## 2018-03-15 DIAGNOSIS — E1140 Type 2 diabetes mellitus with diabetic neuropathy, unspecified: Secondary | ICD-10-CM | POA: Diagnosis not present

## 2018-03-15 DIAGNOSIS — E1165 Type 2 diabetes mellitus with hyperglycemia: Secondary | ICD-10-CM | POA: Diagnosis not present

## 2018-04-12 DIAGNOSIS — J019 Acute sinusitis, unspecified: Secondary | ICD-10-CM | POA: Diagnosis not present

## 2018-04-29 DIAGNOSIS — J301 Allergic rhinitis due to pollen: Secondary | ICD-10-CM | POA: Diagnosis not present

## 2018-05-27 DIAGNOSIS — Z139 Encounter for screening, unspecified: Secondary | ICD-10-CM | POA: Diagnosis not present

## 2018-05-27 DIAGNOSIS — Z125 Encounter for screening for malignant neoplasm of prostate: Secondary | ICD-10-CM | POA: Diagnosis not present

## 2018-05-27 DIAGNOSIS — E1140 Type 2 diabetes mellitus with diabetic neuropathy, unspecified: Secondary | ICD-10-CM | POA: Diagnosis not present

## 2018-05-27 DIAGNOSIS — E785 Hyperlipidemia, unspecified: Secondary | ICD-10-CM | POA: Diagnosis not present

## 2018-05-27 DIAGNOSIS — R600 Localized edema: Secondary | ICD-10-CM | POA: Diagnosis not present

## 2018-05-27 DIAGNOSIS — I10 Essential (primary) hypertension: Secondary | ICD-10-CM | POA: Diagnosis not present

## 2018-05-27 DIAGNOSIS — E1165 Type 2 diabetes mellitus with hyperglycemia: Secondary | ICD-10-CM | POA: Diagnosis not present

## 2018-05-27 DIAGNOSIS — Z794 Long term (current) use of insulin: Secondary | ICD-10-CM | POA: Diagnosis not present

## 2018-07-19 DIAGNOSIS — B9689 Other specified bacterial agents as the cause of diseases classified elsewhere: Secondary | ICD-10-CM | POA: Diagnosis not present

## 2018-07-19 DIAGNOSIS — J019 Acute sinusitis, unspecified: Secondary | ICD-10-CM | POA: Diagnosis not present

## 2018-07-19 DIAGNOSIS — I890 Lymphedema, not elsewhere classified: Secondary | ICD-10-CM | POA: Diagnosis not present

## 2018-07-20 DIAGNOSIS — R350 Frequency of micturition: Secondary | ICD-10-CM | POA: Diagnosis not present

## 2018-08-13 DIAGNOSIS — U071 COVID-19: Secondary | ICD-10-CM | POA: Diagnosis not present

## 2018-08-13 DIAGNOSIS — R6889 Other general symptoms and signs: Secondary | ICD-10-CM | POA: Diagnosis not present

## 2018-08-26 DIAGNOSIS — E1140 Type 2 diabetes mellitus with diabetic neuropathy, unspecified: Secondary | ICD-10-CM | POA: Diagnosis not present

## 2018-08-26 DIAGNOSIS — I10 Essential (primary) hypertension: Secondary | ICD-10-CM | POA: Diagnosis not present

## 2018-08-26 DIAGNOSIS — E785 Hyperlipidemia, unspecified: Secondary | ICD-10-CM | POA: Diagnosis not present

## 2018-08-26 DIAGNOSIS — R600 Localized edema: Secondary | ICD-10-CM | POA: Diagnosis not present

## 2018-08-26 DIAGNOSIS — E1165 Type 2 diabetes mellitus with hyperglycemia: Secondary | ICD-10-CM | POA: Diagnosis not present

## 2018-09-25 DIAGNOSIS — L03116 Cellulitis of left lower limb: Secondary | ICD-10-CM | POA: Diagnosis not present

## 2018-09-25 DIAGNOSIS — H1031 Unspecified acute conjunctivitis, right eye: Secondary | ICD-10-CM | POA: Diagnosis not present

## 2018-09-30 DIAGNOSIS — E119 Type 2 diabetes mellitus without complications: Secondary | ICD-10-CM | POA: Diagnosis not present

## 2018-11-08 DIAGNOSIS — U071 COVID-19: Secondary | ICD-10-CM | POA: Diagnosis not present

## 2018-11-08 DIAGNOSIS — R6889 Other general symptoms and signs: Secondary | ICD-10-CM | POA: Diagnosis not present

## 2018-11-25 DIAGNOSIS — I10 Essential (primary) hypertension: Secondary | ICD-10-CM | POA: Diagnosis not present

## 2018-11-25 DIAGNOSIS — R079 Chest pain, unspecified: Secondary | ICD-10-CM | POA: Diagnosis not present

## 2018-11-25 DIAGNOSIS — E1142 Type 2 diabetes mellitus with diabetic polyneuropathy: Secondary | ICD-10-CM | POA: Diagnosis not present

## 2018-11-25 DIAGNOSIS — E1165 Type 2 diabetes mellitus with hyperglycemia: Secondary | ICD-10-CM | POA: Diagnosis not present

## 2018-11-25 DIAGNOSIS — E785 Hyperlipidemia, unspecified: Secondary | ICD-10-CM | POA: Diagnosis not present

## 2018-11-25 DIAGNOSIS — Z9181 History of falling: Secondary | ICD-10-CM | POA: Diagnosis not present

## 2018-11-25 DIAGNOSIS — Z0000 Encounter for general adult medical examination without abnormal findings: Secondary | ICD-10-CM | POA: Diagnosis not present

## 2018-11-25 DIAGNOSIS — Z23 Encounter for immunization: Secondary | ICD-10-CM | POA: Diagnosis not present

## 2018-11-26 ENCOUNTER — Encounter: Payer: Self-pay | Admitting: *Deleted

## 2018-11-26 ENCOUNTER — Ambulatory Visit (INDEPENDENT_AMBULATORY_CARE_PROVIDER_SITE_OTHER): Payer: Medicare Other | Admitting: Cardiology

## 2018-11-26 ENCOUNTER — Other Ambulatory Visit: Payer: Self-pay

## 2018-11-26 ENCOUNTER — Encounter: Payer: Self-pay | Admitting: Cardiology

## 2018-11-26 VITALS — BP 170/80 | HR 91 | Ht 70.5 in | Wt 280.0 lb

## 2018-11-26 DIAGNOSIS — K635 Polyp of colon: Secondary | ICD-10-CM | POA: Insufficient documentation

## 2018-11-26 DIAGNOSIS — E1142 Type 2 diabetes mellitus with diabetic polyneuropathy: Secondary | ICD-10-CM | POA: Insufficient documentation

## 2018-11-26 DIAGNOSIS — R0600 Dyspnea, unspecified: Secondary | ICD-10-CM

## 2018-11-26 DIAGNOSIS — F419 Anxiety disorder, unspecified: Secondary | ICD-10-CM | POA: Insufficient documentation

## 2018-11-26 DIAGNOSIS — I498 Other specified cardiac arrhythmias: Secondary | ICD-10-CM | POA: Insufficient documentation

## 2018-11-26 DIAGNOSIS — I10 Essential (primary) hypertension: Secondary | ICD-10-CM | POA: Insufficient documentation

## 2018-11-26 DIAGNOSIS — N400 Enlarged prostate without lower urinary tract symptoms: Secondary | ICD-10-CM | POA: Insufficient documentation

## 2018-11-26 DIAGNOSIS — R079 Chest pain, unspecified: Secondary | ICD-10-CM

## 2018-11-26 DIAGNOSIS — I440 Atrioventricular block, first degree: Secondary | ICD-10-CM

## 2018-11-26 DIAGNOSIS — M109 Gout, unspecified: Secondary | ICD-10-CM | POA: Insufficient documentation

## 2018-11-26 DIAGNOSIS — M5030 Other cervical disc degeneration, unspecified cervical region: Secondary | ICD-10-CM | POA: Insufficient documentation

## 2018-11-26 DIAGNOSIS — E119 Type 2 diabetes mellitus without complications: Secondary | ICD-10-CM | POA: Diagnosis not present

## 2018-11-26 DIAGNOSIS — E785 Hyperlipidemia, unspecified: Secondary | ICD-10-CM | POA: Insufficient documentation

## 2018-11-26 DIAGNOSIS — F329 Major depressive disorder, single episode, unspecified: Secondary | ICD-10-CM | POA: Insufficient documentation

## 2018-11-26 DIAGNOSIS — E1165 Type 2 diabetes mellitus with hyperglycemia: Secondary | ICD-10-CM | POA: Insufficient documentation

## 2018-11-26 DIAGNOSIS — M159 Polyosteoarthritis, unspecified: Secondary | ICD-10-CM | POA: Insufficient documentation

## 2018-11-26 DIAGNOSIS — E782 Mixed hyperlipidemia: Secondary | ICD-10-CM

## 2018-11-26 DIAGNOSIS — IMO0002 Reserved for concepts with insufficient information to code with codable children: Secondary | ICD-10-CM | POA: Insufficient documentation

## 2018-11-26 DIAGNOSIS — F32A Depression, unspecified: Secondary | ICD-10-CM | POA: Insufficient documentation

## 2018-11-26 DIAGNOSIS — K219 Gastro-esophageal reflux disease without esophagitis: Secondary | ICD-10-CM | POA: Insufficient documentation

## 2018-11-26 DIAGNOSIS — Z720 Tobacco use: Secondary | ICD-10-CM | POA: Insufficient documentation

## 2018-11-26 HISTORY — DX: Other specified cardiac arrhythmias: I49.8

## 2018-11-26 HISTORY — DX: Morbid (severe) obesity due to excess calories: E66.01

## 2018-11-26 MED ORDER — NITROGLYCERIN 0.4 MG SL SUBL: 0.4000 mg | SUBLINGUAL_TABLET | SUBLINGUAL | 3 refills | Status: DC | PRN

## 2018-11-26 MED ORDER — FUROSEMIDE 20 MG PO TABS: ORAL_TABLET | ORAL | 1 refills | Status: DC

## 2018-11-26 MED ORDER — POTASSIUM CHLORIDE CRYS ER 20 MEQ PO TBCR: EXTENDED_RELEASE_TABLET | ORAL | 1 refills | Status: DC

## 2018-11-26 NOTE — Patient Instructions (Addendum)
Medication Instructions:  Your physician has recommended you make the following change in your medication:   Nitroglycerin 0.4 mg sublingual (under your tongue) as needed for chest pain. If experiencing chest pain, stop what you are doing and sit down. Take 1 nitroglycerin and wait 5 minutes. If chest pain continues, take another nitroglycerin and wait 5 minutes. If chest pain does not subside, take 1 more nitroglycerin and dial 911. You make take a total of 3 nitroglycerin in a 15 minute time frame.  START: Furosemide 20 mg Take 1 tab on Tues,Thurs and Saturdays START: Potassium 20 meq Take 1 tab on Tues ,Thurs,and Saturdays with Furosemide   *If you need a refill on your cardiac medications before your next appointment, please call your pharmacy*  Lab Work: NOne If you have labs (blood work) drawn today and your tests are completely normal, you will receive your results only by: Marland [facility] MyChart Message (if you have MyChart) OR . A paper copy in the mail If you have any lab test that is abnormal or we need to change your treatment, we will call you to review the results.  Testing/Procedures: Your physician has requested that you have an echocardiogram. Echocardiography is a painless test that uses sound waves to create images of your heart. It provides your doctor with information about the size and shape of your heart and how well your heart's chambers and valves are working. This procedure takes approximately one hour. There are no restrictions for this procedure.  Your physician has requested that you have a lexiscan myoview. For further information please visit [email or web address]. Please follow instruction sheet, as given.   Follow-Up: At [facility], you and your health needs are our priority.  As part of our continuing mission to provide you with exceptional heart care, we have created designated Provider Care Teams.  These Care Teams include your primary Cardiologist (physician) and  Advanced Practice Providers (APPs -  Physician Assistants and Nurse Practitioners) who all work together to provide you with the care you need, when you need it.  Your next appointment:   3 months  The format for your next appointment:   In Person  Provider:   Berniece Salines, DO  Other Instructions  Cardiac Nuclear Scan A cardiac nuclear scan is a test that is done to check the flow of blood to your heart. It is done when you are resting and when you are exercising. The test looks for problems such as:  Not enough blood reaching a portion of the heart.  The heart muscle not working as it should. You may need this test if:  You have heart disease.  You have had lab results that are not normal.  You have had heart surgery or a balloon procedure to open up blocked arteries (angioplasty).  You have chest pain.  You have shortness of breath. In this test, a special dye (tracer) is put into your bloodstream. The tracer will travel to your heart. A camera will then take pictures of your heart to see how the tracer moves through your heart. This test is usually done at a hospital and takes 2-4 hours. Tell a doctor about:  Any allergies you have.  All medicines you are taking, including vitamins, herbs, eye drops, creams, and over-the-counter medicines.  Any problems you or family members have had with anesthetic medicines.  Any blood disorders you have.  Any surgeries you have had.  Any medical conditions you have.  Whether you are pregnant or  may be pregnant. What are the risks? Generally, this is a safe test. However, problems may occur, such as:  Serious chest pain and heart attack. This is only a risk if the stress portion of the test is done.  Rapid heartbeat.  A feeling of warmth in your chest. This feeling usually does not last long.  Allergic reaction to the tracer. What happens before the test?  Ask your doctor about changing or stopping your normal medicines.  This is important.  Follow instructions from your doctor about what you cannot eat or drink.  Remove your jewelry on the day of the test. What happens during the test?  An IV tube will be inserted into one of your veins.  Your doctor will give you a small amount of tracer through the IV tube.  You will wait for 20-40 minutes while the tracer moves through your bloodstream.  Your heart will be monitored with an electrocardiogram (ECG).  You will lie down on an exam table.  Pictures of your heart will be taken for about 15-20 minutes.  You may also have a stress test. For this test, one of these things may be done: ? You will be asked to exercise on a treadmill or a stationary bike. ? You will be given medicines that will make your heart work harder. This is done if you are unable to exercise.  When blood flow to your heart has peaked, a tracer will again be given through the IV tube.  After 20-40 minutes, you will get back on the exam table. More pictures will be taken of your heart.  Depending on the tracer that is used, more pictures may need to be taken 3-4 hours later.  Your IV tube will be removed when the test is over. The test may vary among doctors and hospitals. What happens after the test?  Ask your doctor: ? Whether you can return to your normal schedule, including diet, activities, and medicines. ? Whether you should drink more fluids. This will help to remove the tracer from your body. Drink enough fluid to keep your pee (urine) pale yellow.  Ask your doctor, or the department that is doing the test: ? When will my results be ready? ? How will I get my results? Summary  A cardiac nuclear scan is a test that is done to check the flow of blood to your heart.  Tell your doctor whether you are pregnant or may be pregnant.  Before the test, ask your doctor about changing or stopping your normal medicines. This is important.  Ask your doctor whether you can  return to your normal activities. You may be asked to drink more fluids. This information is not intended to replace advice given to you by your health care provider. Make sure you discuss any questions you have with your health care provider. Document Released: 07/06/2017 Document Revised: 05/12/2018 Document Reviewed: 07/06/2017 Elsevier Patient Education  [place].  Echocardiogram An echocardiogram is a procedure that uses painless sound waves (ultrasound) to produce an image of the heart. Images from an echocardiogram can provide important information about:  Signs of coronary artery disease (CAD).  Aneurysm detection. An aneurysm is a weak or damaged part of an artery wall that bulges out from the normal force of blood pumping through the body.  Heart size and shape. Changes in the size or shape of the heart can be associated with certain conditions, including heart failure, aneurysm, and CAD.  Heart muscle  function.  Heart valve function.  Signs of a past heart attack.  Fluid buildup around the heart.  Thickening of the heart muscle.  A tumor or infectious growth around the heart valves. Tell a health care provider about:  Any allergies you have.  All medicines you are taking, including vitamins, herbs, eye drops, creams, and over-the-counter medicines.  Any blood disorders you have.  Any surgeries you have had.  Any medical conditions you have.  Whether you are pregnant or may be pregnant. What are the risks? Generally, this is a safe procedure. However, problems may occur, including:  Allergic reaction to dye (contrast) that may be used during the procedure. What happens before the procedure? No specific preparation is needed. You may eat and drink normally. What happens during the procedure?   An IV tube may be inserted into one of your veins.  You may receive contrast through this tube. A contrast is an injection that improves the quality of the  pictures from your heart.  A gel will be applied to your chest.  A wand-like tool (transducer) will be moved over your chest. The gel will help to transmit the sound waves from the transducer.  The sound waves will harmlessly bounce off of your heart to allow the heart images to be captured in real-time motion. The images will be recorded on a computer. The procedure may vary among health care providers and hospitals. What happens after the procedure?  You may return to your normal, everyday life, including diet, activities, and medicines, unless your health care provider tells you not to do that. Summary  An echocardiogram is a procedure that uses painless sound waves (ultrasound) to produce an image of the heart.  Images from an echocardiogram can provide important information about the size and shape of your heart, heart muscle function, heart valve function, and fluid buildup around your heart.  You do not need to do anything to prepare before this procedure. You may eat and drink normally.  After the echocardiogram is completed, you may return to your normal, everyday life, unless your health care provider tells you not to do that. This information is not intended to replace advice given to you by your health care provider. Make sure you discuss any questions you have with your health care provider. Document Released: 01/18/2000 Document Revised: 05/13/2018 Document Reviewed: 02/23/2016 Elsevier Patient Education  2020 [employer].

## 2018-11-26 NOTE — Progress Notes (Addendum)
Cardiology Office Note:    Date:  11/26/2018   ID:  [name] Williams, DOB 1950/03/19, MRN [id number]  PCP:  Paulina Fusi, MD  Cardiologist:  No primary care provider on file.  Electrophysiologist:  None   Referring MD: No ref. provider found   Chief Complaint  Patient presents with  . Chest Pain  . Shortness of Breath    History of Present Illness:    [name] Williams is a 68 y.o. male with a hx of hypertension, diabetes, obesity and hyperlipidemia presents to be evaluated for chest pain.  Patient tells me over the last few months to months he has been experiencing intermittent chest pain shortness of breath.  He describes her chest pain as a left-sided pain which is dull nonradiating.  He states is usually 2 out of 10 and is worse.  Nothing makes it better or worse.  He states that is always associated with exertion.  He tells me that was recently the worst feeling for him is the shortness of breath.  He needs to be able to walk across the street his sister's house without any trouble now recently he is not able to even walk however across the street and that he starts to feel short of breath.  He is very concerned about this.  Today in the office he is not expressing any chest pain.  Of note, review of care everywhere patient did have echocardiogram in May 2019 which reported mild LVH, EF of 50 to 55%.  Normal diastolic function with mild dilatation of his aorta.  He also does have a stress test listed June 2019 the patient is not sure if he got this testing I am unable to access any more information.   Past Medical History:  Diagnosis Date  . Anxiety disorder   . BPH (benign prostatic hyperplasia)   . Colon polyps   . DDD (degenerative disc disease), cervical   . Depression   . Generalized osteoarthritis of multiple sites   . Gout   . Hyperlipidemia   . Hypertension, benign   . Laryngopharyngeal reflux   . Tobacco abuse   . Uncontrolled type 2 diabetes mellitus with  peripheral neuropathy [facility])     Past Surgical History:  Procedure Laterality Date  . HERNIA REPAIR  1998   Abdominal  . MINOR HEMORRHOIDECTOMY  1998  . NASAL SINUS SURGERY  1978  . SHOULDER SURGERY Left    X2  . TONSILLECTOMY  1967    Current Medications: Current Meds  Medication Sig  . atorvastatin (LIPITOR) 80 MG tablet Take 80 mg by mouth daily.  Marland Kitchen CANNABIDIOL PO Take 250 mg by mouth daily.  . cetirizine (ZYRTEC) 10 MG tablet Take 10 mg by mouth daily.  Marland Kitchen CINNAMON PO Take 1,000 mg by mouth daily.  . citalopram (CELEXA) 20 MG tablet Take 20 mg by mouth daily.  Marland Kitchen gemfibrozil (LOPID) 600 MG tablet Take 600 mg by mouth 2 (two) times daily before a meal.  . glimepiride (AMARYL) 4 MG tablet Take 4 mg by mouth 2 (two) times daily.  Marland Kitchen ibuprofen (ADVIL) 200 MG tablet Take 200 mg by mouth daily.  Marland Kitchen losartan (COZAAR) 100 MG tablet 100 mg at bedtime.  . meloxicam (MOBIC) 7.5 MG tablet Take 7.5 mg by mouth 2 (two) times daily.  . metFORMIN (GLUCOPHAGE) 1000 MG tablet Take 1,000 mg by mouth 2 (two) times daily with a meal.  . montelukast (SINGULAIR) 10 MG tablet 10 mg daily.  Marland Kitchen  Multiple Vitamins-Minerals (PRESERVISION AREDS 2 PO) Take by mouth daily.  . Omega-3 Fatty Acids (FISH OIL) 1200 MG CAPS Take 1,200 mg by mouth daily.  Marland Kitchen omeprazole (PRILOSEC) 40 MG capsule Take 40 mg by mouth daily.  Marland Kitchen OVER THE COUNTER MEDICATION Take 1.33 mg by mouth daily. ELITE CBD  . OVER THE COUNTER MEDICATION Take 1,000 mg by mouth daily. Prosat Genix Multiphase Prostate Support Compound  . tamsulosin (FLOMAX) 0.4 MG CAPS capsule Take 0.4 mg by mouth daily.  . Turmeric (CURCUMIN 95 PO) Take 750 mg by mouth daily. CuraMed Curcumin with Turmerones     Allergies:   Pseudoephedrine, Penicillins, and Tetracyclines & related   Social History   Socioeconomic History  . Marital status: Married    Spouse name: Not on file  . Number of children: Not on file  . Years of education: Not on file  . Highest  education level: Not on file  Occupational History  . Not on file  Social Needs  . Financial resource strain: Not on file  . Food insecurity    Worry: Not on file    Inability: Not on file  . Transportation needs    Medical: Not on file    Non-medical: Not on file  Tobacco Use  . Smoking status: Former Smoker    Quit date: 2015    Years since quitting: 5.8  . Smokeless tobacco: Former Neurosurgeon    Types: Chew  Substance and Sexual Activity  . Alcohol use: Not Currently  . Drug use: Never  . Sexual activity: Not on file  Lifestyle  . Physical activity    Days per week: Not on file    Minutes per session: Not on file  . Stress: Not on file  Relationships  . Social Musician on phone: Not on file    Gets together: Not on file    Attends religious service: Not on file    Active member of club or organization: Not on file    Attends meetings of clubs or organizations: Not on file    Relationship status: Not on file  Other Topics Concern  . Not on file  Social History Narrative  . Not on file     Family History: The patient's family history includes Alcohol abuse in his father; Diabetes in his brother; Lung cancer in his father; Stroke in his father.  ROS:   Review of Systems  Constitution: Negative for decreased appetite, fever and weight gain.  HENT: Negative for congestion, ear discharge, hoarse voice and sore throat.   Eyes: Negative for discharge, redness, vision loss in right eye and visual halos.  Cardiovascular:Reports chest pain and shortness of breath. Negative for leg swelling, orthopnea and palpitations.  Respiratory: Negative for cough, hemoptysis, shortness of breath and snoring.   Endocrine: Negative for heat intolerance and polyphagia.  Hematologic/Lymphatic: Negative for bleeding problem. Does not bruise/bleed easily.  Skin: Negative for flushing, nail changes, rash and suspicious lesions.  Musculoskeletal: Negative for arthritis, joint pain,  muscle cramps, myalgias, neck pain and stiffness.  Gastrointestinal: Negative for abdominal pain, bowel incontinence, diarrhea and excessive appetite.  Genitourinary: Negative for decreased libido, genital sores and incomplete emptying.  Neurological: Negative for brief paralysis, focal weakness, headaches and loss of balance.  Psychiatric/Behavioral: Negative for altered mental status, depression and suicidal ideas.  Allergic/Immunologic: Negative for HIV exposure and  persistent infections.    EKGs/Labs/Other Studies Reviewed:    The following studies were reviewed today:  EKG:  The ekg ordered today demonstrates sinus rhythm, with arrhythmia, heart rate 91 bpm, first-degree AV block.  Recent Labs: November 25, 2018 Chemistry: Glucose 89, creatinine 0.83, sodium 140, potassium 2.6, chloride 100, bicarb 22, calcium 9.6, total protein 7.7, alk phos 88, AST 40, ALT 24 Recent Lipid Panel Lipid profile: Total cholesterol 117, triglycerides 193, HDL 29, LDL 56  Physical Exam:    VS:  BP (!) 170/80 (BP Location: Right Arm, Patient Position: Sitting, Cuff Size: Normal)   Pulse 91   Ht 5' 10.5" (1.791 m)   Wt 280 lb (127 kg)   SpO2 95%   BMI 39.61 kg/m     Wt Readings from Last 3 Encounters:  11/26/18 280 lb (127 kg)    GEN: Obese male, well nourished, well developed in no acute distress HEENT: Normal NECK: No JVD; No carotid bruits LYMPHATICS: No lymphadenopathy CARDIAC: S1S2 noted,RRR, no murmurs, rubs, gallops RESPIRATORY:  Clear to auscultation without rales, wheezing or rhonchi  ABDOMEN: Soft, non-tender, non-distended, +bowel sounds, no guarding. EXTREMITIES: Bilateral +1 edema, no cyanosis, no clubbing MUSCULOSKELETAL: No deformity  SKIN: Warm and dry, evidence of upper extremity vitiligo NEUROLOGIC:  Alert and oriented x 3, non-focal PSYCHIATRIC:  Normal affect, good insight  ASSESSMENT:    1. Dyspnea, unspecified type   2. Chest pain, unspecified type   3. Type 2  diabetes mellitus without complication, without long-term current use of insulin (HCC)   4. Mixed hyperlipidemia   5. Morbid obesity (HCC)   6. First degree AV block   7. Sinus arrhythmia   8. Hypertension, benign    PLAN:    1.  I am concerned about his chest pain.  The patient does have risk factors for coronary artery disease, therefore at this time ischemic evaluation will be appropriate.  We will start with a oncologic nuclear stress test.  I have educated patient about this and he is willing to proceed.  Sublingual nitroglycerin prescription was sent, its protocol and 911 protocol explained and the patient vocalized understanding questions were answered to the patient's satisfaction  2.  Although his echocardiogram in April 2019 was normal however at this time I I do like to repeat this as I am concerned about diastolic dysfunction in this patient.  This will also help me reassess his RV/LV function as well as any other structural abnormalities.  3.  For now with bilateral leg edema cautious diuretics will be ordered with Lasix 20 mg on Tuesday Thursday and Saturdays.  In addition 20 mEq of potassium will be ordered on the days that he take his diuretic.  4.  The patient understands the need to lose weight with diet and exercise. We have discussed specific strategies for this.  5. I did review the patient lipid profile performed at his PCP office no changes in his statin medication for now.   The patient is in agreement with the above plan. The patient left the office in stable condition.  The patient will follow up in 3 months or sooner if needed.   Medication Adjustments/Labs and Tests Ordered: Current medicines are reviewed at length with the patient today.  Concerns regarding medicines are outlined above.  Orders Placed This Encounter  Procedures  . MYOCARDIAL PERFUSION IMAGING  . EKG 12-Lead  . ECHOCARDIOGRAM COMPLETE   Meds ordered this encounter  Medications  .  nitroGLYCERIN (NITROSTAT) 0.4 MG SL tablet    Sig: Place 1 tablet (0.4 mg total) under the tongue every 5 (five)  minutes as needed for chest pain.    Dispense:  30 tablet    Refill:  3  . furosemide (LASIX) 20 MG tablet    Sig: Take 1 tab on Tues,Thurs and Saturdays with potassium    Dispense:  90 tablet    Refill:  1  . potassium chloride SA (KLOR-CON M20) 20 MEQ tablet    Sig: Take 1 tab on Tues,Thurs and Saturdays with furosemide    Dispense:  90 tablet    Refill:  1    Patient Instructions  Medication Instructions:  Your physician has recommended you make the following change in your medication:   Nitroglycerin 0.4 mg sublingual (under your tongue) as needed for chest pain. If experiencing chest pain, stop what you are doing and sit down. Take 1 nitroglycerin and wait 5 minutes. If chest pain continues, take another nitroglycerin and wait 5 minutes. If chest pain does not subside, take 1 more nitroglycerin and dial 911. You make take a total of 3 nitroglycerin in a 15 minute time frame.  START: Furosemide 20 mg Take 1 tab on Tues,Thurs and Saturdays START: Potassium 20 meq Take 1 tab on Tues ,Thurs,and Saturdays with Furosemide   *If you need a refill on your cardiac medications before your next appointment, please call your pharmacy*  Lab Work: NOne If you have labs (blood work) drawn today and your tests are completely normal, you will receive your results only by: Marland Kitchen MyChart Message (if you have MyChart) OR . A paper copy in the mail If you have any lab test that is abnormal or we need to change your treatment, we will call you to review the results.  Testing/Procedures: Your physician has requested that you have an echocardiogram. Echocardiography is a painless test that uses sound waves to create images of your heart. It provides your doctor with information about the size and shape of your heart and how well your heart's chambers and valves are working. This procedure takes  approximately one hour. There are no restrictions for this procedure.  Your physician has requested that you have a lexiscan myoview. For further information please visit [email or web address] Please follow instruction sheet, as given.   Follow-Up: At [facility], you and your health needs are our priority.  As part of our continuing mission to provide you with exceptional heart care, we have created designated Provider Care Teams.  These Care Teams include your primary Cardiologist (physician) and Advanced Practice Providers (APPs -  Physician Assistants and Nurse Practitioners) who all work together to provide you with the care you need, when you need it.  Your next appointment:   3 months  The format for your next appointment:   In Person  Provider:   Thomasene Ripple, DO  Other Instructions  Cardiac Nuclear Scan A cardiac nuclear scan is a test that is done to check the flow of blood to your heart. It is done when you are resting and when you are exercising. The test looks for problems such as:  Not enough blood reaching a portion of the heart.  The heart muscle not working as it should. You may need this test if:  You have heart disease.  You have had lab results that are not normal.  You have had heart surgery or a balloon procedure to open up blocked arteries (angioplasty).  You have chest pain.  You have shortness of breath. In this test, a special dye (tracer) is put into your bloodstream. The tracer will  travel to your heart. A camera will then take pictures of your heart to see how the tracer moves through your heart. This test is usually done at a hospital and takes 2-4 hours. Tell a doctor about:  Any allergies you have.  All medicines you are taking, including vitamins, herbs, eye drops, creams, and over-the-counter medicines.  Any problems you or family members have had with anesthetic medicines.  Any blood disorders you have.  Any surgeries you have had.   Any medical conditions you have.  Whether you are pregnant or may be pregnant. What are the risks? Generally, this is a safe test. However, problems may occur, such as:  Serious chest pain and heart attack. This is only a risk if the stress portion of the test is done.  Rapid heartbeat.  A feeling of warmth in your chest. This feeling usually does not last long.  Allergic reaction to the tracer. What happens before the test?  Ask your doctor about changing or stopping your normal medicines. This is important.  Follow instructions from your doctor about what you cannot eat or drink.  Remove your jewelry on the day of the test. What happens during the test?  An IV tube will be inserted into one of your veins.  Your doctor will give you a small amount of tracer through the IV tube.  You will wait for 20-40 minutes while the tracer moves through your bloodstream.  Your heart will be monitored with an electrocardiogram (ECG).  You will lie down on an exam table.  Pictures of your heart will be taken for about 15-20 minutes.  You may also have a stress test. For this test, one of these things may be done: ? You will be asked to exercise on a treadmill or a stationary bike. ? You will be given medicines that will make your heart work harder. This is done if you are unable to exercise.  When blood flow to your heart has peaked, a tracer will again be given through the IV tube.  After 20-40 minutes, you will get back on the exam table. More pictures will be taken of your heart.  Depending on the tracer that is used, more pictures may need to be taken 3-4 hours later.  Your IV tube will be removed when the test is over. The test may vary among doctors and hospitals. What happens after the test?  Ask your doctor: ? Whether you can return to your normal schedule, including diet, activities, and medicines. ? Whether you should drink more fluids. This will help to remove the  tracer from your body. Drink enough fluid to keep your pee (urine) pale yellow.  Ask your doctor, or the department that is doing the test: ? When will my results be ready? ? How will I get my results? Summary  A cardiac nuclear scan is a test that is done to check the flow of blood to your heart.  Tell your doctor whether you are pregnant or may be pregnant.  Before the test, ask your doctor about changing or stopping your normal medicines. This is important.  Ask your doctor whether you can return to your normal activities. You may be asked to drink more fluids. This information is not intended to replace advice given to you by your health care provider. Make sure you discuss any questions you have with your health care provider. Document Released: 07/06/2017 Document Revised: 05/12/2018 Document Reviewed: 07/06/2017 Elsevier Patient Education  2020 Elsevier  Inc.  Echocardiogram An echocardiogram is a procedure that uses painless sound waves (ultrasound) to produce an image of the heart. Images from an echocardiogram can provide important information about:  Signs of coronary artery disease (CAD).  Aneurysm detection. An aneurysm is a weak or damaged part of an artery wall that bulges out from the normal force of blood pumping through the body.  Heart size and shape. Changes in the size or shape of the heart can be associated with certain conditions, including heart failure, aneurysm, and CAD.  Heart muscle function.  Heart valve function.  Signs of a past heart attack.  Fluid buildup around the heart.  Thickening of the heart muscle.  A tumor or infectious growth around the heart valves. Tell a health care provider about:  Any allergies you have.  All medicines you are taking, including vitamins, herbs, eye drops, creams, and over-the-counter medicines.  Any blood disorders you have.  Any surgeries you have had.  Any medical conditions you have.  Whether you are  pregnant or may be pregnant. What are the risks? Generally, this is a safe procedure. However, problems may occur, including:  Allergic reaction to dye (contrast) that may be used during the procedure. What happens before the procedure? No specific preparation is needed. You may eat and drink normally. What happens during the procedure?   An IV tube may be inserted into one of your veins.  You may receive contrast through this tube. A contrast is an injection that improves the quality of the pictures from your heart.  A gel will be applied to your chest.  A wand-like tool (transducer) will be moved over your chest. The gel will help to transmit the sound waves from the transducer.  The sound waves will harmlessly bounce off of your heart to allow the heart images to be captured in real-time motion. The images will be recorded on a computer. The procedure may vary among health care providers and hospitals. What happens after the procedure?  You may return to your normal, everyday life, including diet, activities, and medicines, unless your health care provider tells you not to do that. Summary  An echocardiogram is a procedure that uses painless sound waves (ultrasound) to produce an image of the heart.  Images from an echocardiogram can provide important information about the size and shape of your heart, heart muscle function, heart valve function, and fluid buildup around your heart.  You do not need to do anything to prepare before this procedure. You may eat and drink normally.  After the echocardiogram is completed, you may return to your normal, everyday life, unless your health care provider tells you not to do that. This information is not intended to replace advice given to you by your health care provider. Make sure you discuss any questions you have with your health care provider. Document Released: 01/18/2000 Document Revised: 05/13/2018 Document Reviewed: 02/23/2016  Elsevier Patient Education  2020 [employer].      Adopting a Healthy Lifestyle.  Know what a healthy weight is for you (roughly BMI <25) and aim to maintain this   Aim for 7+ servings of fruits and vegetables daily   65-80+ fluid ounces of water or unsweet tea for healthy kidneys   Limit to max 1 drink of alcohol per day; avoid smoking/tobacco   Limit animal fats in diet for cholesterol and heart health - choose grass fed whenever available   Avoid highly processed foods, and foods high in saturated/trans  fats   Aim for low stress - take time to unwind and care for your mental health   Aim for 150 min of moderate intensity exercise weekly for heart health, and weights twice weekly for bone health   Aim for 7-9 hours of sleep daily   When it comes to diets, agreement about the perfect plan isnt easy to find, even among the experts. Experts at the [facility] [employer] developed an idea known as the Healthy Eating Plate. Just imagine a plate divided into logical, healthy portions.   The emphasis is on diet quality:   Load up on vegetables and fruits - one-half of your plate: Aim for color and variety, and remember that potatoes dont count.   Go for whole grains - one-quarter of your plate: Whole wheat, barley, wheat berries, quinoa, oats, brown rice, and foods made with them. If you want pasta, go with whole wheat pasta.   Protein power - one-quarter of your plate: Fish, chicken, beans, and nuts are all healthy, versatile protein sources. Limit red meat.   The diet, however, does go beyond the plate, offering a few other suggestions.   Use healthy plant oils, such as olive, canola, soy, corn, sunflower and peanut. Check the labels, and avoid partially hydrogenated oil, which have unhealthy trans fats.   If youre thirsty, drink water. Coffee and tea are good in moderation, but skip sugary drinks and limit milk and dairy products to one or two daily servings.    The type of carbohydrate in the diet is more important than the amount. Some sources of carbohydrates, such as vegetables, fruits, whole grains, and beans-are healthier than others.   Finally, stay active  Signed, Thomasene Ripple, DO  11/26/2018 9:50 PM    [facility]

## 2018-12-08 ENCOUNTER — Telehealth (HOSPITAL_COMMUNITY): Payer: Self-pay | Admitting: *Deleted

## 2018-12-08 NOTE — Telephone Encounter (Signed)
Attempted to call patient regarding upcoming appointment- no answer, unable to leave a message.  [name] Williams  

## 2018-12-14 ENCOUNTER — Telehealth: Payer: Self-pay | Admitting: *Deleted

## 2018-12-14 NOTE — Telephone Encounter (Signed)
Mobile # listed is wrong #. Son who is listed on DPR was attempted to contact.Left message on voicemail in reference to upcoming appointment scheduled for 12/15/2018. Phone number given for a call back so details instructions can be given. [name] Williams, [name] Williams

## 2018-12-15 ENCOUNTER — Other Ambulatory Visit: Payer: Self-pay

## 2018-12-15 ENCOUNTER — Ambulatory Visit (INDEPENDENT_AMBULATORY_CARE_PROVIDER_SITE_OTHER): Payer: Medicare Other

## 2018-12-15 DIAGNOSIS — R079 Chest pain, unspecified: Secondary | ICD-10-CM

## 2018-12-15 MED ORDER — REGADENOSON 0.4 MG/5ML IV SOLN
0.4000 mg | Freq: Once | INTRAVENOUS | Status: AC
  Administered 2018-12-15: 0.4 mg via INTRAVENOUS

## 2018-12-15 MED ORDER — TECHNETIUM TC 99M TETROFOSMIN IV KIT
30.2000 | PACK | Freq: Once | INTRAVENOUS | Status: AC | PRN
  Administered 2018-12-15: 30.2 via INTRAVENOUS

## 2018-12-16 ENCOUNTER — Ambulatory Visit: Payer: Medicare Other

## 2018-12-16 LAB — MYOCARDIAL PERFUSION IMAGING
LV dias vol: 106 mL (ref 62–150)
LV sys vol: 47 mL
Peak HR: 101 {beats}/min
Rest HR: 91 {beats}/min
SDS: 4
SRS: 1
SSS: 5
TID: 0.82

## 2018-12-16 MED ORDER — TECHNETIUM TC 99M TETROFOSMIN IV KIT
31.2000 | PACK | Freq: Once | INTRAVENOUS | Status: AC | PRN
  Administered 2018-12-16: 31.2 via INTRAVENOUS

## 2018-12-17 ENCOUNTER — Telehealth: Payer: Self-pay | Admitting: *Deleted

## 2018-12-17 NOTE — Telephone Encounter (Signed)
Telephone call to patient. Left message that stress test was normal and to call with any questions. 

## 2018-12-17 NOTE — Telephone Encounter (Signed)
-----   Message from Berniece Salines, DO sent at 12/16/2018  7:49 PM EST ----- Normal stress test, please notify patient and forward results to pcp.

## 2018-12-29 ENCOUNTER — Other Ambulatory Visit: Payer: Self-pay

## 2018-12-29 ENCOUNTER — Ambulatory Visit (INDEPENDENT_AMBULATORY_CARE_PROVIDER_SITE_OTHER): Payer: Medicare Other

## 2018-12-29 DIAGNOSIS — R0600 Dyspnea, unspecified: Secondary | ICD-10-CM | POA: Diagnosis not present

## 2018-12-29 NOTE — Progress Notes (Signed)
Complete echocardiogram has been performed.  Jimmy Kush [place] RDCS, RVT 

## 2019-01-05 ENCOUNTER — Telehealth: Payer: Self-pay | Admitting: Cardiology

## 2019-01-05 NOTE — Telephone Encounter (Signed)
Wants test results

## 2019-01-05 NOTE — Telephone Encounter (Signed)
Telephone call to patient. Informed of echo results. 

## 2019-01-16 ENCOUNTER — Telehealth: Payer: Self-pay | Admitting: Cardiology

## 2019-01-16 NOTE — Telephone Encounter (Signed)
The patient's sister Jacqlyn Larsen called the answering service with concerns of patient having chest pain from swollen artery.  After a couple of unanswered calls, I finally got hold of them a couple of hours later.  The patient has been having some mild chest discomfort and shortness of breath that kept him awake all night.  Jacqlyn Larsen, who works at Dr. Denton Lank office, notes that the patient had not been taking any Lasix, which he was supposed to take on Tuesday, Thursday and Saturday.  She made sure he took it this morning and he has been urinating more and seems to be breathing better without chest pain.  I reviewed recent normal stress test and echocardiogram showing moderately dilated aorta.  We discussed ensuring good blood pressure control and using Lasix to remove extra volume and make his breathing easier.  She is not sure that he has been taking his losartan regularly.  She will now ensure that he is taking all of his medicines as directed.  They will notify the office tomorrow if he continues to have issues.  Also recommended that if he has chest pain that is unrelieved he could call our office back or proceed to the hospital if it is significant.

## 2019-02-24 ENCOUNTER — Encounter: Payer: Self-pay | Admitting: Cardiology

## 2019-02-24 ENCOUNTER — Other Ambulatory Visit: Payer: Self-pay

## 2019-02-24 ENCOUNTER — Ambulatory Visit (INDEPENDENT_AMBULATORY_CARE_PROVIDER_SITE_OTHER): Payer: Medicare HMO | Admitting: Cardiology

## 2019-02-24 VITALS — BP 164/84 | HR 96 | Ht 70.0 in | Wt 271.0 lb

## 2019-02-24 DIAGNOSIS — I7121 Aneurysm of the ascending aorta, without rupture: Secondary | ICD-10-CM

## 2019-02-24 DIAGNOSIS — I5032 Chronic diastolic (congestive) heart failure: Secondary | ICD-10-CM | POA: Diagnosis not present

## 2019-02-24 DIAGNOSIS — I10 Essential (primary) hypertension: Secondary | ICD-10-CM | POA: Diagnosis not present

## 2019-02-24 DIAGNOSIS — I712 Thoracic aortic aneurysm, without rupture: Secondary | ICD-10-CM

## 2019-02-24 DIAGNOSIS — I5189 Other ill-defined heart diseases: Secondary | ICD-10-CM

## 2019-02-24 LAB — BASIC METABOLIC PANEL
BUN/Creatinine Ratio: 14 (ref 10–24)
BUN: 11 mg/dL (ref 8–27)
CO2: 22 mmol/L (ref 20–29)
Calcium: 9.9 mg/dL (ref 8.6–10.2)
Chloride: 101 mmol/L (ref 96–106)
Creatinine, Ser: 0.78 mg/dL (ref 0.76–1.27)
GFR calc Af Amer: 106 mL/min/{1.73_m2} (ref >59)
GFR calc non Af Amer: 92 mL/min/{1.73_m2} (ref >59)
Glucose: 94 mg/dL (ref 65–99)
Potassium: 4.3 mmol/L (ref 3.5–5.2)
Sodium: 138 mmol/L (ref 134–144)

## 2019-02-24 LAB — MAGNESIUM: Magnesium: 1.8 mg/dL (ref 1.6–2.3)

## 2019-02-24 MED ORDER — FUROSEMIDE 20 MG PO TABS: 20.0000 mg | ORAL_TABLET | Freq: Every day | ORAL | 3 refills | Status: DC

## 2019-02-24 MED ORDER — POTASSIUM CHLORIDE CRYS ER 20 MEQ PO TBCR: 20.0000 meq | EXTENDED_RELEASE_TABLET | Freq: Every day | ORAL | 3 refills | Status: DC

## 2019-02-24 NOTE — Patient Instructions (Addendum)
Medication Instructions:  Your physician has recommended you make the following change in your medication:  1. INCREASE LASIX TO 20 MG DAILY.  2. INCREASE POTASSIUM 20 MEQ DAILY.  *PLEASE MAKE SURE TO BRING ALL YOUR MEDICATIONS AT YOUR NEXT APPOINTMENT*  *If you need a refill on your cardiac medications before your next appointment, please call your pharmacy*  Lab Work: BMET, [facility]  If you have labs (blood work) drawn today and your tests are completely normal, you will receive your results only by: Marland Kitchen MyChart Message (if you have MyChart) OR . A paper copy in the mail If you have any lab test that is abnormal or we need to change your treatment, we will call you to review the results.  Testing/Procedures: NONE  Follow-Up: At [facility], you and your health needs are our priority.  As part of our continuing mission to provide you with exceptional heart care, we have created designated Provider Care Teams.  These Care Teams include your primary Cardiologist (physician) and Advanced Practice Providers (APPs -  Physician Assistants and Nurse Practitioners) who all work together to provide you with the care you need, when you need it.  Your next appointment:   1 month(s)  The format for your next appointment:   In Person  Provider:   You will see Berniece Salines, DO.  Or, you can be scheduled with the following Advanced Practice Provider on your designated Care Team (at our [facility]):  Laurann Montana, FNP

## 2019-02-24 NOTE — Progress Notes (Signed)
Cardiology Office Note:    Date:  02/24/2019   ID:  [name] Williams, DOB 1951-01-28, MRN [id number]  PCP:  Paulina Fusi, MD  Cardiologist:  Thomasene Ripple, DO  Electrophysiologist:  None   Referring MD: Paulina Fusi, MD   Follow-up for shortness of breath and hypertension.  History of Present Illness:    [name] Williams is a 69 y.o. male with a hx of hypertension, diabetes, obesity, hyperlipidemia who initially presented on Tober 23rd 20 to be evaluated for chest pain and shortness of breath.  At the conclusion of the visit I recommended he undergo a pharmacologic nuclear stress test as well as a repeat echocardiogram.  In addition due to concerns of diastolic dysfunction and bilateral leg edema I also ordered Lasix Tuesday Thursday and Saturdays with potassium supplement for the patient.  In the interim he was able to get this testing done.  His result has been previously called out to him.  He is here today for follow-up visit.  Here for no complaints at this time.  He tells me that his PCP had changed his medications but he is unaware what they were.  Past Medical History:  Diagnosis Date  . Anxiety disorder   . BPH (benign prostatic hyperplasia)   . Colon polyps   . DDD (degenerative disc disease), cervical   . Depression   . Generalized osteoarthritis of multiple sites   . Gout   . Hyperlipidemia   . Hypertension, benign   . Laryngopharyngeal reflux   . Tobacco abuse   . Uncontrolled type 2 diabetes mellitus with peripheral neuropathy [facility])     Past Surgical History:  Procedure Laterality Date  . HERNIA REPAIR  1998   Abdominal  . MINOR HEMORRHOIDECTOMY  1998  . NASAL SINUS SURGERY  1978  . SHOULDER SURGERY Left    X2  . TONSILLECTOMY  1967    Current Medications: Current Meds  Medication Sig  . atorvastatin (LIPITOR) 80 MG tablet Take 80 mg by mouth daily.  Marland Kitchen CANNABIDIOL PO Take 250 mg by mouth daily.  . cetirizine (ZYRTEC) 10 MG tablet Take 10 mg by  mouth daily.  Marland Kitchen CINNAMON PO Take 1,000 mg by mouth daily.  . citalopram (CELEXA) 20 MG tablet Take 20 mg by mouth daily.  . furosemide (LASIX) 20 MG tablet Take 1 tab on Tues,Thurs and Saturdays with potassium  . gemfibrozil (LOPID) 600 MG tablet Take 600 mg by mouth 2 (two) times daily before a meal.  . glimepiride (AMARYL) 4 MG tablet Take 4 mg by mouth 2 (two) times daily.  Marland Kitchen ibuprofen (ADVIL) 200 MG tablet Take 200 mg by mouth daily.  Marland Kitchen losartan (COZAAR) 100 MG tablet 100 mg at bedtime.  . meloxicam (MOBIC) 7.5 MG tablet Take 7.5 mg by mouth 2 (two) times daily.  . metFORMIN (GLUCOPHAGE) 1000 MG tablet Take 1,000 mg by mouth 2 (two) times daily with a meal.  . montelukast (SINGULAIR) 10 MG tablet 10 mg daily.  . Multiple Vitamins-Minerals (PRESERVISION AREDS 2 PO) Take by mouth daily.  . nitroGLYCERIN (NITROSTAT) 0.4 MG SL tablet Place 1 tablet (0.4 mg total) under the tongue every 5 (five) minutes as needed for chest pain.  . Omega-3 Fatty Acids (FISH OIL) 1200 MG CAPS Take 1,200 mg by mouth daily.  Marland Kitchen omeprazole (PRILOSEC) 40 MG capsule Take 40 mg by mouth daily.  Marland Kitchen OVER THE COUNTER MEDICATION Take 1.33 mg by mouth daily. ELITE CBD  . OVER THE  COUNTER MEDICATION Take 1,000 mg by mouth daily. Prosat Genix Multiphase Prostate Support Compound  . potassium chloride SA (KLOR-CON M20) 20 MEQ tablet Take 1 tab on Tues,Thurs and Saturdays with furosemide  . tamsulosin (FLOMAX) 0.4 MG CAPS capsule Take 0.4 mg by mouth daily.  . Turmeric (CURCUMIN 95 PO) Take 750 mg by mouth daily. CuraMed Curcumin with Turmerones     Allergies:   Pseudoephedrine, Penicillins, and Tetracyclines & related   Social History   Socioeconomic History  . Marital status: Married    Spouse name: Not on file  . Number of children: Not on file  . Years of education: Not on file  . Highest education level: Not on file  Occupational History  . Not on file  Tobacco Use  . Smoking status: Former Smoker    Quit date:  2015    Years since quitting: 6.0  . Smokeless tobacco: Former Neurosurgeon    Types: Chew  Substance and Sexual Activity  . Alcohol use: Not Currently  . Drug use: Never  . Sexual activity: Not on file  Other Topics Concern  . Not on file  Social History Narrative  . Not on file   Social Determinants of Health   Financial Resource Strain:   . Difficulty of Paying Living Expenses: Not on file  Food Insecurity:   . Worried About Programme researcher, broadcasting/film/video in the Last Year: Not on file  . Ran Out of Food in the Last Year: Not on file  Transportation Needs:   . Lack of Transportation (Medical): Not on file  . Lack of Transportation (Non-Medical): Not on file  Physical Activity:   . Days of Exercise per Week: Not on file  . Minutes of Exercise per Session: Not on file  Stress:   . Feeling of Stress : Not on file  Social Connections:   . Frequency of Communication with Friends and Family: Not on file  . Frequency of Social Gatherings with Friends and Family: Not on file  . Attends Religious Services: Not on file  . Active Member of Clubs or Organizations: Not on file  . Attends Banker Meetings: Not on file  . Marital Status: Not on file     Family History: The patient's family history includes Alcohol abuse in his father; Diabetes in his brother; Lung cancer in his father; Stroke in his father.  ROS:   Review of Systems  Constitution: Negative for decreased appetite, fever and weight gain.  HENT: Negative for congestion, ear discharge, hoarse voice and sore throat.   Eyes: Negative for discharge, redness, vision loss in right eye and visual halos.  Cardiovascular: Negative for chest pain, dyspnea on exertion, leg swelling, orthopnea and palpitations.  Respiratory: Negative for cough, hemoptysis, shortness of breath and snoring.   Endocrine: Negative for heat intolerance and polyphagia.  Hematologic/Lymphatic: Negative for bleeding problem. Does not bruise/bleed easily.    Skin: Negative for flushing, nail changes, rash and suspicious lesions.  Musculoskeletal: Negative for arthritis, joint pain, muscle cramps, myalgias, neck pain and stiffness.  Gastrointestinal: Negative for abdominal pain, bowel incontinence, diarrhea and excessive appetite.  Genitourinary: Negative for decreased libido, genital sores and incomplete emptying.  Neurological: Negative for brief paralysis, focal weakness, headaches and loss of balance.  Psychiatric/Behavioral: Negative for altered mental status, depression and suicidal ideas.  Allergic/Immunologic: Negative for HIV exposure and persistent infections.    EKGs/Labs/Other Studies Reviewed:    The following studies were reviewed today:   EKG:  The ekg ordered today demonstrates   Pharmacologic stress test:  The left ventricular ejection fraction is normal (55-65%).  Nuclear stress EF: 56%.  There was no ST segment deviation noted during stress.  This is a low risk study.  No evidence of ischemia or MI  Normal EF  Transthoracic echocardiogram 12/29/2018 IMPRESSIONS  1. Left ventricular ejection fraction, by visual estimation, is 60 to 65%. The left ventricle has normal function. There is moderately increased left ventricular hypertrophy.  2. Left ventricular diastolic parameters are consistent with Grade I diastolic dysfunction (impaired relaxation).  3. Left atrial size was mildly dilated.  4. The aortic valve is normal in structure. Aortic valve regurgitation is not visualized. Mild to moderate aortic valve sclerosis/calcification without any evidence of aortic stenosis.  5. The pulmonic valve was normal in structure. Pulmonic valve regurgitation is not visualized.  6. 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  Recent Labs: No results found for requested labs within last 8760 hours.  Recent Lipid Panel No results found for: CHOL, TRIG, HDL, CHOLHDL, VLDL, LDLCALC, LDLDIRECT  Physical  Exam:    VS:  Ht 5\' 10"  (1.778 m)   BMI 40.18 kg/m     Wt Readings from Last 3 Encounters:  12/15/18 280 lb (127 kg)  11/26/18 280 lb (127 kg)     GEN: Well nourished, well developed in no acute distress HEENT: Normal NECK: No JVD; No carotid bruits LYMPHATICS: No lymphadenopathy CARDIAC: S1S2 noted,RRR, no murmurs, rubs, gallops RESPIRATORY:  Clear to auscultation without rales, wheezing or rhonchi  ABDOMEN: Soft, non-tender, non-distended, +bowel sounds, no guarding. EXTREMITIES: Bilateral leg edema-improved from previous, No cyanosis, no clubbing MUSCULOSKELETAL:  No deformity  SKIN: Warm and dry NEUROLOGIC:  Alert and oriented x 3, non-focal PSYCHIATRIC:  Normal affect, good insight  ASSESSMENT:    1. Essential hypertension   2. Ascending aortic aneurysm (HCC)   3. Diastolic dysfunction    PLAN:    He is hypertensive in the office today-he takes losartan 100 mg daily, and his prior visit I had added Lasix 20 mg Tuesday, Thursday and Saturdays.  He tells me that his PCP did change his medications around but is unaware of what medication was changed and he thinks it may be his high blood pressure medicine.  I will also optimize his he hypertensive today but in an effort to avoid hypotension I'm only going to increase his Lasix to 20 mg daily.  I would like to add carvedilol to his blood pressure medication regimen but again to see the patient in close follow-up which should be in 1 month so I can be able to review all of his medications he has at home therefore of asked to bring all his medications so we can be able to make a safe change in his antihypertensive.  I did again again advised patient that he does have a moderate dilated ascending aorta.  Plan to repeat CTA in 1 year.  The patient is in agreement with the above plan. The patient left the office in stable condition.  The patient will follow up in 1 month or sooner if needed.   Medication Adjustments/Labs and Tests  Ordered: Current medicines are reviewed at length with the patient today.  Concerns regarding medicines are outlined above.  No orders of the defined types were placed in this encounter.  No orders of the defined types were placed in this encounter.   There are no Patient Instructions on file  for this visit.   Adopting a Healthy Lifestyle.  Know what a healthy weight is for you (roughly BMI <25) and aim to maintain this   Aim for 7+ servings of fruits and vegetables daily   65-80+ fluid ounces of water or unsweet tea for healthy kidneys   Limit to max 1 drink of alcohol per day; avoid smoking/tobacco   Limit animal fats in diet for cholesterol and heart health - choose grass fed whenever available   Avoid highly processed foods, and foods high in saturated/trans fats   Aim for low stress - take time to unwind and care for your mental health   Aim for 150 min of moderate intensity exercise weekly for heart health, and weights twice weekly for bone health   Aim for 7-9 hours of sleep daily   When it comes to diets, agreement about the perfect plan isnt easy to find, even among the experts. Experts at the [employer] developed an idea known as the Healthy Eating Plate. Just imagine a plate divided into logical, healthy portions.   The emphasis is on diet quality:   Load up on vegetables and fruits - one-half of your plate: Aim for color and variety, and remember that potatoes dont count.   Go for whole grains - one-quarter of your plate: Whole wheat, barley, wheat berries, quinoa, oats, brown rice, and foods made with them. If you want pasta, go with whole wheat pasta.   Protein power - one-quarter of your plate: Fish, chicken, beans, and nuts are all healthy, versatile protein sources. Limit red meat.   The diet, however, does go beyond the plate, offering a few other suggestions.   Use healthy plant oils, such as olive, canola, soy, corn, sunflower and  peanut. Check the labels, and avoid partially hydrogenated oil, which have unhealthy trans fats.   If youre thirsty, drink water. Coffee and tea are good in moderation, but skip sugary drinks and limit milk and dairy products to one or two daily servings.   The type of carbohydrate in the diet is more important than the amount. Some sources of carbohydrates, such as vegetables, fruits, whole grains, and beans-are healthier than others.   Finally, stay active  Signed, Thomasene Ripple, DO  02/24/2019 10:04 AM     [facility]

## 2019-02-25 ENCOUNTER — Encounter: Payer: Self-pay | Admitting: *Deleted

## 2019-03-02 DIAGNOSIS — R600 Localized edema: Secondary | ICD-10-CM | POA: Diagnosis not present

## 2019-03-02 DIAGNOSIS — E1142 Type 2 diabetes mellitus with diabetic polyneuropathy: Secondary | ICD-10-CM | POA: Diagnosis not present

## 2019-03-02 DIAGNOSIS — E785 Hyperlipidemia, unspecified: Secondary | ICD-10-CM | POA: Diagnosis not present

## 2019-03-02 DIAGNOSIS — T7840XA Allergy, unspecified, initial encounter: Secondary | ICD-10-CM | POA: Diagnosis not present

## 2019-03-02 DIAGNOSIS — I10 Essential (primary) hypertension: Secondary | ICD-10-CM | POA: Diagnosis not present

## 2019-03-02 DIAGNOSIS — E1165 Type 2 diabetes mellitus with hyperglycemia: Secondary | ICD-10-CM | POA: Diagnosis not present

## 2019-03-10 DIAGNOSIS — E1165 Type 2 diabetes mellitus with hyperglycemia: Secondary | ICD-10-CM | POA: Diagnosis not present

## 2019-03-10 DIAGNOSIS — E1142 Type 2 diabetes mellitus with diabetic polyneuropathy: Secondary | ICD-10-CM | POA: Diagnosis not present

## 2019-03-28 ENCOUNTER — Ambulatory Visit: Payer: Medicare HMO | Admitting: Cardiology

## 2019-03-30 DIAGNOSIS — H60311 Diffuse otitis externa, right ear: Secondary | ICD-10-CM | POA: Diagnosis not present

## 2019-03-30 DIAGNOSIS — J069 Acute upper respiratory infection, unspecified: Secondary | ICD-10-CM | POA: Diagnosis not present

## 2019-04-01 ENCOUNTER — Encounter: Payer: Self-pay | Admitting: Cardiology

## 2019-04-01 ENCOUNTER — Telehealth: Payer: Self-pay | Admitting: Cardiology

## 2019-04-01 ENCOUNTER — Other Ambulatory Visit: Payer: Self-pay

## 2019-04-01 ENCOUNTER — Ambulatory Visit (INDEPENDENT_AMBULATORY_CARE_PROVIDER_SITE_OTHER): Payer: Medicare HMO | Admitting: Cardiology

## 2019-04-01 VITALS — BP 130/68 | HR 91 | Ht 70.0 in | Wt 269.0 lb

## 2019-04-01 DIAGNOSIS — R072 Precordial pain: Secondary | ICD-10-CM | POA: Diagnosis not present

## 2019-04-01 DIAGNOSIS — Z01812 Encounter for preprocedural laboratory examination: Secondary | ICD-10-CM

## 2019-04-01 DIAGNOSIS — Z01810 Encounter for preprocedural cardiovascular examination: Secondary | ICD-10-CM

## 2019-04-01 DIAGNOSIS — I10 Essential (primary) hypertension: Secondary | ICD-10-CM | POA: Diagnosis not present

## 2019-04-01 MED ORDER — METOPROLOL SUCCINATE ER 25 MG PO TB24: 12.5000 mg | ORAL_TABLET | Freq: Every day | ORAL | 1 refills | Status: DC

## 2019-04-01 NOTE — Patient Instructions (Signed)
Medication Instructions:  Your physician has recommended you make the following change in your medication:   START: Toprol XL 25 mg Take 1 /2 tab daily   *If you need a refill on your cardiac medications before your next appointment, please call your pharmacy*   Lab Work: Your physician recommends that you return for lab work in:   3-7 days prior to CT: BMP   If you have labs (blood work) drawn today and your tests are completely normal, you will receive your results only by: Marland Kitchen MyChart Message (if you have MyChart) OR . A paper copy in the mail If you have any lab test that is abnormal or we need to change your treatment, we will call you to review the results.   Testing/Procedures: Your physician has requested that you have cardiac CT. Cardiac computed tomography (CT) is a painless test that uses an x-ray machine to take clear, detailed pictures of your heart. For further information please visit [email or web address]. Please follow instruction sheet as given.  Your cardiac CT will be scheduled at one of the below locations:   [facility] [address] [place], [place] [address] [phone]  If scheduled at [facility], please arrive at the [facility] main entrance of [facility] 30 minutes prior to test start time. Proceed to the [facility] Radiology Department (first floor) to check-in and test prep.  Please follow these instructions carefully (unless otherwise directed):  Hold all erectile dysfunction medications at least 3 days (72 hrs) prior to test.  On the Night Before the Test: . Be sure to Drink plenty of water. . Do not consume any caffeinated/decaffeinated beverages or chocolate 12 hours prior to your test. . Do not take any antihistamines 12 hours prior to your test. . If you take Metformin do not take 24 hours prior to test.   On the Day of the Test: . Drink plenty of water. Do not drink any water within one hour of the  test. . Do not eat any food 4 hours prior to the test. . You may take your regular medications prior to the test.  . Hold Insulin and glimeride day of test . Take metoprolol (Lopressor) two hours prior to test. . HOLD Furosemide morning of the test. . FEMALES- please wear underwire-free bra if available                     -If HR is less than 55 BPM- No Beta Blocker(metoprolol succinate)                -IF HR is greater than 55 BPM and patient is less than or equal to 70 yrs old Toprol XL (Metoprolol succinate)12.5 mg x1.                   After the Test: . Drink plenty of water. . After receiving IV contrast, you may experience a mild flushed feeling. This is normal. . On occasion, you may experience a mild rash up to 24 hours after the test. This is not dangerous. If this occurs, you can take Benadryl 25 mg and increase your fluid intake. . If you experience trouble breathing, this can be serious. If it is severe call 911 IMMEDIATELY. If it is mild, please call our office. . If you take any of these medications:Metformin, please do not take 48 hours after completing test unless otherwise instructed.   Once we have confirmed authorization from  your insurance company, we will call you to set up a date and time for your test.   For non-scheduling related questions, please contact the cardiac imaging nurse navigator should you have any questions/concerns: Marchia Bond, RN Navigator Cardiac Imaging [facility] [phone] mobile   Follow-Up: At [facility], you and your health needs are our priority.  As part of our continuing mission to provide you with exceptional heart care, we have created designated Provider Care Teams.  These Care Teams include your primary Cardiologist (physician) and Advanced Practice Providers (APPs -  Physician Assistants and Nurse Practitioners) who all work together to provide you with the care you need, when you need it.  We  recommend signing up for the patient portal called "MyChart".  Sign up information is provided on this After Visit Summary.  MyChart is used to connect with patients for Virtual Visits (Telemedicine).  Patients are able to view lab/test results, encounter notes, upcoming appointments, etc.  Non-urgent messages can be sent to your provider as well.   To learn more about what you can do with MyChart, go to [email or web address].    Your next appointment:   3 month(s)  The format for your next appointment:   In Person  Provider:   Berniece Salines, DO   Other Instructions

## 2019-04-01 NOTE — Progress Notes (Signed)
Cardiology Office Note:    Date:  04/01/2019   ID:  [name] Williams, DOB 11-19-1950, MRN [id number]  PCP:  Paulina Fusi, MD  Cardiologist:  Thomasene Ripple, DO  Electrophysiologist:  None   Referring MD: Paulina Fusi, MD   Chief Complaint  Patient presents with  . Follow-up   Follow up with for chest pain.   History of Present Illness:    [name] Williams is a 69 y.o. male with a hx of hypertension, diabetes, obesity, hyperlipidemia initially presented on November 26, 2018 to be evaluated for chest pain shortness of breath.  At that visit I recommended patient undergo a pharmacologic nuclear stress test along with echocardiogram.  He has been able to get this testing done.  His pharmacologic nuclear stress test was reported as normal and his echocardiogram did show evidence of SI dysfunction and moderately increased left ventricular hypertrophy.  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  I last saw the patient on February 24, 2019 at that time his blood pressure was elevated but no changes were made to his medication as the patient told me that his PCP has changed his medication around I increase his Lasix to 20 mg daily and to avoid hypotension I did not add any other medications.  He is here today for his follow-up visit he tells me that he is still experiencing chest pain which he thinks is more intense.  He described as a left-sided nonradiating pain which now comes on whenever he tries to exert himself.  He said nothing makes it better nothing make it worse.  Sometimes he has associated shortness of breath.  He is very worried therefore he is wondering if there is anything more I could do to help him.  In the office he does not have any chest pain but states that any exertion even climbing up stairs brings on this pain now is lasting anywhere from 20 to 30 minutes.  He has not used his nitroglycerin as he has been apprehensive to do this.  At times he admits intermittent  palpitations.   Past Medical History:  Diagnosis Date  . Anxiety disorder   . BPH (benign prostatic hyperplasia)   . Colon polyps   . DDD (degenerative disc disease), cervical   . Depression   . Generalized osteoarthritis of multiple sites   . Gout   . Hyperlipidemia   . Hypertension, benign   . Laryngopharyngeal reflux   . Tobacco abuse   . Uncontrolled type 2 diabetes mellitus with peripheral neuropathy [facility])     Past Surgical History:  Procedure Laterality Date  . HERNIA REPAIR  1998   Abdominal  . MINOR HEMORRHOIDECTOMY  1998  . NASAL SINUS SURGERY  1978  . SHOULDER SURGERY Left    X2  . TONSILLECTOMY  1967    Current Medications: Current Meds  Medication Sig  . atorvastatin (LIPITOR) 80 MG tablet Take 80 mg by mouth daily.  Marland Kitchen CANNABIDIOL PO Take 250 mg by mouth daily.  . cetirizine (ZYRTEC) 10 MG tablet Take 10 mg by mouth daily.  Marland Kitchen CINNAMON PO Take 1,000 mg by mouth daily.  . citalopram (CELEXA) 20 MG tablet Take 20 mg by mouth daily.  . furosemide (LASIX) 20 MG tablet Take 1 tablet (20 mg total) by mouth daily.  Marland Kitchen gemfibrozil (LOPID) 600 MG tablet Take 600 mg by mouth 2 (two) times daily before a meal.  . glimepiride (AMARYL) 4 MG tablet Take  4 mg by mouth 2 (two) times daily.  . Insulin Degludec (TRESIBA) 100 UNIT/ML SOLN Inject into the skin.  Marland Kitchen losartan (COZAAR) 100 MG tablet 100 mg at bedtime.  . meloxicam (MOBIC) 7.5 MG tablet Take 7.5 mg by mouth 2 (two) times daily.  . metFORMIN (GLUCOPHAGE) 1000 MG tablet Take 1,000 mg by mouth 2 (two) times daily with a meal.  . montelukast (SINGULAIR) 10 MG tablet 10 mg daily.  . Multiple Vitamins-Minerals (PRESERVISION AREDS 2 PO) Take by mouth daily.  Marland Kitchen NOVOLOG FLEXPEN 100 UNIT/ML FlexPen   . Omega-3 Fatty Acids (FISH OIL) 1200 MG CAPS Take 1,200 mg by mouth daily.  Marland Kitchen omeprazole (PRILOSEC) 40 MG capsule Take 40 mg by mouth daily.  Marland Kitchen OVER THE COUNTER MEDICATION Take 1.33 mg by mouth daily. ELITE CBD  . OVER THE  COUNTER MEDICATION Take 1,000 mg by mouth daily. Prosat Genix Multiphase Prostate Support Compound  . potassium chloride SA (KLOR-CON) 20 MEQ tablet Take 1 tablet (20 mEq total) by mouth daily.  . tamsulosin (FLOMAX) 0.4 MG CAPS capsule Take 0.4 mg by mouth daily.  . Turmeric (CURCUMIN 95 PO) Take 750 mg by mouth daily. CuraMed Curcumin with Turmerones     Allergies:   Pseudoephedrine, Penicillins, and Tetracyclines & related   Social History   Socioeconomic History  . Marital status: Married    Spouse name: Not on file  . Number of children: Not on file  . Years of education: Not on file  . Highest education level: Not on file  Occupational History  . Not on file  Tobacco Use  . Smoking status: Former Smoker    Quit date: 2015    Years since quitting: 6.1  . Smokeless tobacco: Former Neurosurgeon    Types: Chew  Substance and Sexual Activity  . Alcohol use: Not Currently  . Drug use: Never  . Sexual activity: Not on file  Other Topics Concern  . Not on file  Social History Narrative  . Not on file   Social Determinants of Health   Financial Resource Strain:   . Difficulty of Paying Living Expenses: Not on file  Food Insecurity:   . Worried About Programme researcher, broadcasting/film/video in the Last Year: Not on file  . Ran Out of Food in the Last Year: Not on file  Transportation Needs:   . Lack of Transportation (Medical): Not on file  . Lack of Transportation (Non-Medical): Not on file  Physical Activity:   . Days of Exercise per Week: Not on file  . Minutes of Exercise per Session: Not on file  Stress:   . Feeling of Stress : Not on file  Social Connections:   . Frequency of Communication with Friends and Family: Not on file  . Frequency of Social Gatherings with Friends and Family: Not on file  . Attends Religious Services: Not on file  . Active Member of Clubs or Organizations: Not on file  . Attends Banker Meetings: Not on file  . Marital Status: Not on file     Family  History: The patient's family history includes Alcohol abuse in his father; Diabetes in his brother; Lung cancer in his father; Stroke in his father.  ROS:   Review of Systems  Constitution: Negative for decreased appetite, fever and weight gain.  HENT: Negative for congestion, ear discharge, hoarse voice and sore throat.   Eyes: Negative for discharge, redness, vision loss in right eye and visual halos.  Cardiovascular: Negative  for chest pain, dyspnea on exertion, leg swelling, orthopnea and palpitations.  Respiratory: Negative for cough, hemoptysis, shortness of breath and snoring.   Endocrine: Negative for heat intolerance and polyphagia.  Hematologic/Lymphatic: Negative for bleeding problem. Does not bruise/bleed easily.  Skin: Negative for flushing, nail changes, rash and suspicious lesions.  Musculoskeletal: Negative for arthritis, joint pain, muscle cramps, myalgias, neck pain and stiffness.  Gastrointestinal: Negative for abdominal pain, bowel incontinence, diarrhea and excessive appetite.  Genitourinary: Negative for decreased libido, genital sores and incomplete emptying.  Neurological: Negative for brief paralysis, focal weakness, headaches and loss of balance.  Psychiatric/Behavioral: Negative for altered mental status, depression and suicidal ideas.  Allergic/Immunologic: Negative for HIV exposure and persistent infections.    EKGs/Labs/Other Studies Reviewed:    The following studies were reviewed today:   EKG: None today  TTE IMPRESSIONS 12/29/2018: 1. Left ventricular ejection fraction, by visual estimation, is 60 to  65%. The left ventricle has normal function. There is moderately increased  left ventricular hypertrophy.  2. Left ventricular diastolic parameters are consistent with Grade I  diastolic dysfunction (impaired relaxation).  3. Left atrial size was mildly dilated.  4. The aortic valve is normal in structure. Aortic valve regurgitation is  not  visualized. Mild to moderate aortic valve sclerosis/calcification  without any evidence of aortic stenosis.  5. The pulmonic valve was normal in structure. Pulmonic valve  regurgitation is not visualized.  6. 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   Pharmacological nuclear stress test:  The left ventricular ejection fraction is normal (55-65%).  Nuclear stress EF: 56%.  There was no ST segment deviation noted during stress.  This is a low risk study.  No evidence of ischemia or MI  Normal EF  Recent Labs: 02/24/2019: BUN 11; Creatinine, Ser 0.78; Magnesium 1.8; Potassium 4.3; Sodium 138  Recent Lipid Panel No results found for: CHOL, TRIG, HDL, CHOLHDL, VLDL, LDLCALC, LDLDIRECT  Physical Exam:    VS:  BP 130/68 (BP Location: Left Arm, Patient Position: Sitting, Cuff Size: Normal)   Pulse 91   Ht 5\' 10"  (1.778 m)   Wt 269 lb (122 kg)   SpO2 98%   BMI 38.60 kg/m     Wt Readings from Last 3 Encounters:  04/01/19 269 lb (122 kg)  02/24/19 271 lb (122.9 kg)  12/15/18 280 lb (127 kg)     GEN: Well nourished, well developed in no acute distress HEENT: Normal NECK: No JVD; No carotid bruits LYMPHATICS: No lymphadenopathy CARDIAC: S1S2 noted,RRR, no murmurs, rubs, gallops RESPIRATORY:  Clear to auscultation without rales, wheezing or rhonchi  ABDOMEN: Soft, non-tender, non-distended, +bowel sounds, no guarding. EXTREMITIES: No edema, No cyanosis, no clubbing MUSCULOSKELETAL:  No deformity  SKIN: Warm and dry NEUROLOGIC:  Alert and oriented x 3, non-focal PSYCHIATRIC:  Normal affect, good insight  ASSESSMENT:    1. Precordial pain   2. Hypertension, benign   3. Pre-procedure lab exam    PLAN:     He still is experiencing chest pain which he tells me is getting worse.  I am concerned with his risk factors despite his negative oncologic nuclear stress test I think the patient will be better served with a coronary CTA in the setting of  his worsening chest pain and risk factor for coronary artery disease.  He has as needed nitroglycerin if instructed the patient to use if needed I also asked him to go to the nearest emergency department if this chest pain  recurs.   Hypertension-blood pressure has improved.    With his intermittent palpitations I am going to start him with Toprol-XL 1.5 mg daily.  The patient is in agreement with the above plan. The patient left the office in stable condition.  The patient will follow up in 3 months or sooner if needed.   Medication Adjustments/Labs and Tests Ordered: Current medicines are reviewed at length with the patient today.  Concerns regarding medicines are outlined above.  Orders Placed This Encounter  Procedures  . CT CORONARY FRACTIONAL FLOW RESERVE DATA PREP  . CT CORONARY FRACTIONAL FLOW RESERVE FLUID ANALYSIS  . CT CORONARY MORPH W/CTA COR W/SCORE W/CA W/CM &/OR WO/CM  . Basic Metabolic Panel (BMET)   Meds ordered this encounter  Medications  . metoprolol succinate (TOPROL XL) 25 MG 24 hr tablet    Sig: Take 0.5 tablets (12.5 mg total) by mouth daily.    Dispense:  45 tablet    Refill:  1    Patient Instructions  Medication Instructions:  Your physician has recommended you make the following change in your medication:   START: Toprol XL 25 mg Take 1 /2 tab daily   *If you need a refill on your cardiac medications before your next appointment, please call your pharmacy*   Lab Work: Your physician recommends that you return for lab work in:   3-7 days prior to CT: BMP   If you have labs (blood work) drawn today and your tests are completely normal, you will receive your results only by: Marland Kitchen MyChart Message (if you have MyChart) OR . A paper copy in the mail If you have any lab test that is abnormal or we need to change your treatment, we will call you to review the results.   Testing/Procedures: Your physician has requested that you have cardiac CT. Cardiac  computed tomography (CT) is a painless test that uses an x-ray machine to take clear, detailed pictures of your heart. For further information please visit [email or web address] Please follow instruction sheet as given.  Your cardiac CT will be scheduled at one of the below locations:   [facility] [address] [place], [place] [address] [phone]  If scheduled at [facility], please arrive at the [facility] main entrance of [facility] 30 minutes prior to test start time. Proceed to the [facility] Radiology Department (first floor) to check-in and test prep.  Please follow these instructions carefully (unless otherwise directed):  Hold all erectile dysfunction medications at least 3 days (72 hrs) prior to test.  On the Night Before the Test: . Be sure to Drink plenty of water. . Do not consume any caffeinated/decaffeinated beverages or chocolate 12 hours prior to your test. . Do not take any antihistamines 12 hours prior to your test. . If you take Metformin do not take 24 hours prior to test.   On the Day of the Test: . Drink plenty of water. Do not drink any water within one hour of the test. . Do not eat any food 4 hours prior to the test. . You may take your regular medications prior to the test.  . Hold Insulin and glimeride day of test . Take metoprolol (Lopressor) two hours prior to test. . HOLD Furosemide morning of the test. . FEMALES- please wear underwire-free bra if available                     -If HR is less  than 55 BPM- No Beta Blocker(metoprolol succinate)                -IF HR is greater than 55 BPM and patient is less than or equal to 75 yrs old Toprol XL (Metoprolol succinate)12.5 mg x1.                   After the Test: . Drink plenty of water. . After receiving IV contrast, you may experience a mild flushed feeling. This is normal. . On occasion, you may experience a mild rash up to 24 hours after the test. This is  not dangerous. If this occurs, you can take Benadryl 25 mg and increase your fluid intake. . If you experience trouble breathing, this can be serious. If it is severe call 911 IMMEDIATELY. If it is mild, please call our office. . If you take any of these medications:Metformin, please do not take 48 hours after completing test unless otherwise instructed.   Once we have confirmed authorization from your insurance company, we will call you to set up a date and time for your test.   For non-scheduling related questions, please contact the cardiac imaging nurse navigator should you have any questions/concerns: Rockwell Alexandria, RN Navigator Cardiac Imaging [facility] [phone] mobile   Follow-Up: At [facility], you and your health needs are our priority.  As part of our continuing mission to provide you with exceptional heart care, we have created designated Provider Care Teams.  These Care Teams include your primary Cardiologist (physician) and Advanced Practice Providers (APPs -  Physician Assistants and Nurse Practitioners) who all work together to provide you with the care you need, when you need it.  We recommend signing up for the patient portal called "MyChart".  Sign up information is provided on this After Visit Summary.  MyChart is used to connect with patients for Virtual Visits (Telemedicine).  Patients are able to view lab/test results, encounter notes, upcoming appointments, etc.  Non-urgent messages can be sent to your provider as well.   To learn more about what you can do with MyChart, go to [email or web address].    Your next appointment:   3 month(s)  The format for your next appointment:   In Person  Provider:   Thomasene Ripple, DO   Other Instructions      Adopting a Healthy Lifestyle.  Know what a healthy weight is for you (roughly BMI <25) and aim to maintain this   Aim for 7+ servings of fruits and vegetables daily   65-80+  fluid ounces of water or unsweet tea for healthy kidneys   Limit to max 1 drink of alcohol per day; avoid smoking/tobacco   Limit animal fats in diet for cholesterol and heart health - choose grass fed whenever available   Avoid highly processed foods, and foods high in saturated/trans fats   Aim for low stress - take time to unwind and care for your mental health   Aim for 150 min of moderate intensity exercise weekly for heart health, and weights twice weekly for bone health   Aim for 7-9 hours of sleep daily   When it comes to diets, agreement about the perfect plan isnt easy to find, even among the experts. Experts at the [facility] [employer] developed an idea known as the Healthy Eating Plate. Just imagine a plate divided into logical, healthy portions.   The emphasis is on diet quality:   Load up  on vegetables and fruits - one-half of your plate: Aim for color and variety, and remember that potatoes dont count.   Go for whole grains - one-quarter of your plate: Whole wheat, barley, wheat berries, quinoa, oats, brown rice, and foods made with them. If you want pasta, go with whole wheat pasta.   Protein power - one-quarter of your plate: Fish, chicken, beans, and nuts are all healthy, versatile protein sources. Limit red meat.   The diet, however, does go beyond the plate, offering a few other suggestions.   Use healthy plant oils, such as olive, canola, soy, corn, sunflower and peanut. Check the labels, and avoid partially hydrogenated oil, which have unhealthy trans fats.   If youre thirsty, drink water. Coffee and tea are good in moderation, but skip sugary drinks and limit milk and dairy products to one or two daily servings.   The type of carbohydrate in the diet is more important than the amount. Some sources of carbohydrates, such as vegetables, fruits, whole grains, and beans-are healthier than others.   Finally, stay active  Signed, Thomasene Ripple, DO    04/01/2019 5:01 PM    [facility]

## 2019-04-01 NOTE — Telephone Encounter (Signed)
Dr. Delena Bali from [facility] would like our office to fax a copy of the patient's stress test results to them.  Their fax number is [phone] Attn: Steward Drone

## 2019-04-04 NOTE — Telephone Encounter (Signed)
Stress test results faxed to Dr. Carolanne Grumbling office.

## 2019-04-26 DIAGNOSIS — Z01812 Encounter for preprocedural laboratory examination: Secondary | ICD-10-CM | POA: Diagnosis not present

## 2019-04-26 DIAGNOSIS — I10 Essential (primary) hypertension: Secondary | ICD-10-CM | POA: Diagnosis not present

## 2019-04-27 ENCOUNTER — Encounter: Payer: Self-pay | Admitting: *Deleted

## 2019-04-27 LAB — BASIC METABOLIC PANEL
BUN/Creatinine Ratio: 15 (ref 10–24)
BUN: 12 mg/dL (ref 8–27)
CO2: 23 mmol/L (ref 20–29)
Calcium: 9.6 mg/dL (ref 8.6–10.2)
Chloride: 103 mmol/L (ref 96–106)
Creatinine, Ser: 0.81 mg/dL (ref 0.76–1.27)
GFR calc Af Amer: 104 mL/min/{1.73_m2} (ref >59)
GFR calc non Af Amer: 90 mL/min/{1.73_m2} (ref >59)
Glucose: 87 mg/dL (ref 65–99)
Potassium: 4.5 mmol/L (ref 3.5–5.2)
Sodium: 140 mmol/L (ref 134–144)

## 2019-05-06 ENCOUNTER — Other Ambulatory Visit: Payer: Self-pay

## 2019-05-06 DIAGNOSIS — Z01812 Encounter for preprocedural laboratory examination: Secondary | ICD-10-CM

## 2019-05-06 NOTE — Addendum Note (Signed)
Addended by: Jacinta Shoe on: 05/06/2019 10:38 AM   Modules accepted: Orders

## 2019-05-07 LAB — BASIC METABOLIC PANEL
BUN/Creatinine Ratio: 17 (ref 10–24)
BUN: 15 mg/dL (ref 8–27)
CO2: 23 mmol/L (ref 20–29)
Calcium: 9.5 mg/dL (ref 8.6–10.2)
Chloride: 102 mmol/L (ref 96–106)
Creatinine, Ser: 0.89 mg/dL (ref 0.76–1.27)
GFR calc Af Amer: 100 mL/min/{1.73_m2} (ref >59)
GFR calc non Af Amer: 87 mL/min/{1.73_m2} (ref >59)
Glucose: 63 mg/dL — ABNORMAL LOW (ref 65–99)
Potassium: 4.5 mmol/L (ref 3.5–5.2)
Sodium: 139 mmol/L (ref 134–144)

## 2019-05-09 ENCOUNTER — Telehealth (HOSPITAL_COMMUNITY): Payer: Self-pay | Admitting: Emergency Medicine

## 2019-05-09 ENCOUNTER — Telehealth: Payer: Self-pay

## 2019-05-09 NOTE — Telephone Encounter (Signed)
Spoke with patient regarding results and recommendation.  Patient verbalizes understanding and is agreeable to plan of care. Advised patient to call back with any issues or concerns.  

## 2019-05-09 NOTE — Telephone Encounter (Signed)
Reaching out to patient to offer assistance regarding upcoming cardiac imaging study; pt verbalizes understanding of appt date/time, parking situation and where to check in, pre-test NPO status and medications ordered, and verified current allergies; name and call back number provided for further questions should they arise Marchia Bond RN Navigator Cardiac Imaging [facility] [phone] office [phone] cell   Reviewed medications to take/not take. Patient appreciated the call. [name] Williams

## 2019-05-09 NOTE — Telephone Encounter (Signed)
-----   Message from Berniece Salines, DO sent at 05/07/2019  2:35 PM EDT ----- Blood glucose on the lower side but otherwise normal labs.

## 2019-05-10 ENCOUNTER — Other Ambulatory Visit: Payer: Self-pay

## 2019-05-10 ENCOUNTER — Ambulatory Visit (HOSPITAL_COMMUNITY)
Admission: RE | Admit: 2019-05-10 | Discharge: 2019-05-10 | Disposition: A | Discharge status: Home / Self Care | Payer: Medicare PPO | Source: Ambulatory Visit | Attending: Cardiology | Admitting: Cardiology

## 2019-05-10 DIAGNOSIS — I2510 Atherosclerotic heart disease of native coronary artery without angina pectoris: Secondary | ICD-10-CM | POA: Diagnosis not present

## 2019-05-10 DIAGNOSIS — R072 Precordial pain: Secondary | ICD-10-CM | POA: Diagnosis not present

## 2019-05-10 MED ORDER — IOHEXOL 350 MG/ML SOLN
80.0000 mL | Freq: Once | INTRAVENOUS | Status: AC | PRN
  Administered 2019-05-10: 80 mL via INTRAVENOUS

## 2019-05-10 MED ORDER — NITROGLYCERIN 0.4 MG SL SUBL: 0.8000 mg | SUBLINGUAL_TABLET | Freq: Once | SUBLINGUAL | Status: AC

## 2019-05-10 MED ORDER — NITROGLYCERIN 0.4 MG SL SUBL
SUBLINGUAL_TABLET | SUBLINGUAL | Status: AC
  Administered 2019-05-10: 0.8 mg via SUBLINGUAL
  Filled 2019-05-10: qty 2

## 2019-05-10 MED ORDER — METOPROLOL TARTRATE 5 MG/5ML IV SOLN: 5.0000 mg | INTRAVENOUS | Status: DC | PRN

## 2019-05-10 MED ORDER — METOPROLOL TARTRATE 5 MG/5ML IV SOLN
INTRAVENOUS | Status: AC
  Administered 2019-05-10: 5 mg via INTRAVENOUS
  Filled 2019-05-10: qty 5

## 2019-05-11 ENCOUNTER — Ambulatory Visit (HOSPITAL_COMMUNITY)
Admission: RE | Admit: 2019-05-11 | Discharge: 2019-05-11 | Disposition: A | Discharge status: Home / Self Care | Payer: Medicare PPO | Source: Ambulatory Visit | Attending: Cardiology | Admitting: Cardiology

## 2019-05-11 DIAGNOSIS — R072 Precordial pain: Secondary | ICD-10-CM | POA: Diagnosis not present

## 2019-05-11 DIAGNOSIS — I2510 Atherosclerotic heart disease of native coronary artery without angina pectoris: Secondary | ICD-10-CM | POA: Diagnosis not present

## 2019-05-16 DIAGNOSIS — I2510 Atherosclerotic heart disease of native coronary artery without angina pectoris: Secondary | ICD-10-CM | POA: Diagnosis not present

## 2019-05-17 ENCOUNTER — Ambulatory Visit: Payer: Medicare PPO | Admitting: Cardiology

## 2019-05-17 ENCOUNTER — Other Ambulatory Visit: Payer: Self-pay

## 2019-05-17 ENCOUNTER — Encounter: Payer: Self-pay | Admitting: Cardiology

## 2019-05-17 VITALS — BP 132/70 | HR 72 | Ht 70.0 in | Wt 273.0 lb

## 2019-05-17 DIAGNOSIS — R931 Abnormal findings on diagnostic imaging of heart and coronary circulation: Secondary | ICD-10-CM

## 2019-05-17 DIAGNOSIS — E782 Mixed hyperlipidemia: Secondary | ICD-10-CM | POA: Diagnosis not present

## 2019-05-17 DIAGNOSIS — I2510 Atherosclerotic heart disease of native coronary artery without angina pectoris: Secondary | ICD-10-CM | POA: Diagnosis not present

## 2019-05-17 DIAGNOSIS — I10 Essential (primary) hypertension: Secondary | ICD-10-CM

## 2019-05-17 DIAGNOSIS — E119 Type 2 diabetes mellitus without complications: Secondary | ICD-10-CM | POA: Diagnosis not present

## 2019-05-17 MED ORDER — ASPIRIN EC 81 MG PO TBEC: 81.0000 mg | DELAYED_RELEASE_TABLET | Freq: Every day | ORAL | 3 refills | Status: DC

## 2019-05-17 NOTE — Patient Instructions (Signed)
Medication Instructions:  Your physician has recommended you make the following change in your medication:  1.  START Aspirin 81 mg taking 1 daily  *If you need a refill on your cardiac medications before your next appointment, please call your pharmacy*   Lab Work: TODAY:  BMET & CBC  If you have labs (blood work) drawn today and your tests are completely normal, you will receive your results only by: Marland Kitchen MyChart Message (if you have MyChart) OR . A paper copy in the mail If you have any lab test that is abnormal or we need to change your treatment, we will call you to review the results.   Testing/Procedures: Your physician has requested that you have a cardiac catheterization. Cardiac catheterization is used to diagnose and/or treat various heart conditions. Doctors may recommend this procedure for a number of different reasons. The most common reason is to evaluate chest pain. Chest pain can be a symptom of coronary artery disease (CAD), and cardiac catheterization can show whether plaque is narrowing or blocking your heart's arteries. This procedure is also used to evaluate the valves, as well as measure the blood flow and oxygen levels in different parts of your heart. For further information please visit [email or web address]. Please follow instruction sheet, BELOW:     [place] [address] Dept: [phone] Loc: [place]  05/17/2019  You are scheduled for a Cardiac Catheterization on Friday, April 16 with Dr. Kathlyn Sacramento.  1. Please arrive at the [facility] (Main Entrance A) at [facility]: [address] [place], [place] [address] at 7:00 AM (This time is two hours before your procedure to ensure your preparation). Free valet parking service is available.   Special note: Every effort is made to have your procedure done on time. Please  understand that emergencies sometimes delay scheduled procedures.  2. Diet: Do not eat solid foods after midnight.  The patient may have clear liquids until 5am upon the day of the procedure.  3. Labs: You will need to have blood drawn on TODAY   Tomorrow, Wednesday, 05/18/2019: Go to [facility] at 12:00 for [place].  Once you have been tested, you will be required to go home and quarantine until after your procedure.  NO VISITORS, OTHER THAN THE ONES THAT LIVE IN YOUR HOME, PLEASE   4. Medication instructions in preparation for your procedure:   Contrast Allergy: No    Stop taking, Lasix (Furosemide)  Friday, April 16,  Take only 0 units of insulin the night before your procedure. Do not take any insulin on the day of the procedure.  Do not take Diabetes Med Glucophage (Metformin) on the day of the procedure and HOLD 48 HOURS AFTER THE PROCEDURE.  On the morning of your procedure, take your Aspirin and any morning medicines NOT listed above.  You may use sips of water.  5. Plan for one night stay--bring personal belongings. 6. Bring a current list of your medications and current insurance cards. 7. You MUST have a responsible person to drive you home. 8. Someone MUST be with you the first 24 hours after you arrive home or your discharge will be delayed. 9. Please wear clothes that are easy to get on and off and wear slip-on shoes.  Thank you for allowing [place] to care for you!   -- [facility]      Follow-Up: At [facility], you and your health needs are our priority.  As part of our continuing mission to provide you with exceptional heart care, we have created designated Provider Care Teams.  These Care Teams include your primary Cardiologist (physician) and Advanced Practice Providers (APPs -  Physician Assistants and Nurse Practitioners) who all work together to provide you with the  care you need, when you need it.  We recommend signing up for the patient portal called "MyChart".  Sign up information is provided on this After Visit Summary.  MyChart is used to connect with patients for Virtual Visits (Telemedicine).  Patients are able to view lab/test results, encounter notes, upcoming appointments, etc.  Non-urgent messages can be sent to your provider as well.   To learn more about what you can do with MyChart, go to [email or web address].    Your next appointment:   2 week(s)  06/08/19  2:15  ARRIVE AT 2:00   The format for your next appointment:   In Person  Provider:   Berniece Salines, DO   Other Instructions

## 2019-05-17 NOTE — Progress Notes (Signed)
Cardiology Office Note:    Date:  05/17/2019   ID:  [name] Williams, DOB 1950-11-15, MRN [id number]  PCP:  Paulina Fusi, MD  Cardiologist:  Thomasene Ripple, DO  Electrophysiologist:  None   Referring MD: Paulina Fusi, MD   Chief Complaint  Patient presents with  . Follow-up  Follow-up to discuss coronary CTA results.  History of Present Illness:    [name] Williams is a 69 y.o. male with a hx of diabetes mellitus, hypertension, obesity, hyperlipidemia who presented initially to be evaluated for chest pain and shortness of breath.  At this initial visit the patient was ordered pharmacologic stress test as well as an echocardiogram.  He did get a pharmacologic stress test that was reportedly normal with and no evidence of ischemia.  Due to persistent symptoms the patient was recommended to undergo coronary CTA.  He is here today to discuss the results of his coronary CTA.  Past Medical History:  Diagnosis Date  . Anxiety disorder   . BPH (benign prostatic hyperplasia)   . Colon polyps   . DDD (degenerative disc disease), cervical   . Depression   . Generalized osteoarthritis of multiple sites   . Gout   . Hyperlipidemia   . Hypertension, benign   . Laryngopharyngeal reflux   . Tobacco abuse   . Uncontrolled type 2 diabetes mellitus with peripheral neuropathy [facility])     Past Surgical History:  Procedure Laterality Date  . HERNIA REPAIR  1998   Abdominal  . MINOR HEMORRHOIDECTOMY  1998  . NASAL SINUS SURGERY  1978  . SHOULDER SURGERY Left    X2  . TONSILLECTOMY  1967    Current Medications: Current Meds  Medication Sig  . amLODipine (NORVASC) 5 MG tablet 5 mg daily.  Marland Kitchen atorvastatin (LIPITOR) 80 MG tablet Take 80 mg by mouth daily.  Marland Kitchen CANNABIDIOL PO Take 250 mg by mouth daily.  . cetirizine (ZYRTEC) 10 MG tablet Take 10 mg by mouth daily.  Marland Kitchen CINNAMON PO Take 1,000 mg by mouth daily.  . citalopram (CELEXA) 20 MG tablet Take 20 mg by mouth daily.  . furosemide  (LASIX) 20 MG tablet Take 1 tablet (20 mg total) by mouth daily.  Marland Kitchen gemfibrozil (LOPID) 600 MG tablet Take 600 mg by mouth 2 (two) times daily before a meal.  . glimepiride (AMARYL) 4 MG tablet Take 4 mg by mouth 2 (two) times daily.  . Insulin Degludec (TRESIBA) 100 UNIT/ML SOLN Inject into the skin.  Marland Kitchen losartan (COZAAR) 100 MG tablet 100 mg at bedtime.  . meloxicam (MOBIC) 7.5 MG tablet Take 7.5 mg by mouth 2 (two) times daily.  . metFORMIN (GLUCOPHAGE) 1000 MG tablet Take 1,000 mg by mouth 2 (two) times daily with a meal.  . metoprolol succinate (TOPROL XL) 25 MG 24 hr tablet Take 0.5 tablets (12.5 mg total) by mouth daily.  . montelukast (SINGULAIR) 10 MG tablet 10 mg daily.  . Multiple Vitamins-Minerals (PRESERVISION AREDS 2 PO) Take by mouth daily.  Marland Kitchen NOVOLOG FLEXPEN 100 UNIT/ML FlexPen   . Omega-3 Fatty Acids (FISH OIL) 1200 MG CAPS Take 1,200 mg by mouth daily.  Marland Kitchen omeprazole (PRILOSEC) 40 MG capsule Take 40 mg by mouth daily.  Marland Kitchen OVER THE COUNTER MEDICATION Take 1.33 mg by mouth daily. ELITE CBD  . OVER THE COUNTER MEDICATION Take 1,000 mg by mouth daily. Prosat Genix Multiphase Prostate Support Compound  . potassium chloride SA (KLOR-CON) 20 MEQ tablet Take 1 tablet (20  mEq total) by mouth daily.  . tamsulosin (FLOMAX) 0.4 MG CAPS capsule Take 0.4 mg by mouth daily.  . Turmeric (CURCUMIN 95 PO) Take 750 mg by mouth daily. CuraMed Curcumin with Turmerones     Allergies:   Pseudoephedrine, Penicillins, and Tetracyclines & related   Social History   Socioeconomic History  . Marital status: Married    Spouse name: Not on file  . Number of children: Not on file  . Years of education: Not on file  . Highest education level: Not on file  Occupational History  . Not on file  Tobacco Use  . Smoking status: Former Smoker    Quit date: 2015    Years since quitting: 6.2  . Smokeless tobacco: Former Neurosurgeon    Types: Chew  Substance and Sexual Activity  . Alcohol use: Not Currently  .  Drug use: Never  . Sexual activity: Not on file  Other Topics Concern  . Not on file  Social History Narrative  . Not on file   Social Determinants of Health   Financial Resource Strain:   . Difficulty of Paying Living Expenses:   Food Insecurity:   . Worried About Programme researcher, broadcasting/film/video in the Last Year:   . Barista in the Last Year:   Transportation Needs:   . Freight forwarder (Medical):   Marland Kitchen Lack of Transportation (Non-Medical):   Physical Activity:   . Days of Exercise per Week:   . Minutes of Exercise per Session:   Stress:   . Feeling of Stress :   Social Connections:   . Frequency of Communication with Friends and Family:   . Frequency of Social Gatherings with Friends and Family:   . Attends Religious Services:   . Active Member of Clubs or Organizations:   . Attends Banker Meetings:   Marland Kitchen Marital Status:      Family History: The patient's family history includes Alcohol abuse in his father; Diabetes in his brother; Lung cancer in his father; Stroke in his father.  ROS:   Review of Systems  Constitution: Negative for decreased appetite, fever and weight gain.  HENT: Negative for congestion, ear discharge, hoarse voice and sore throat.   Eyes: Negative for discharge, redness, vision loss in right eye and visual halos.  Cardiovascular: Negative for chest pain, dyspnea on exertion, leg swelling, orthopnea and palpitations.  Respiratory: Negative for cough, hemoptysis, shortness of breath and snoring.   Endocrine: Negative for heat intolerance and polyphagia.  Hematologic/Lymphatic: Negative for bleeding problem. Does not bruise/bleed easily.  Skin: Negative for flushing, nail changes, rash and suspicious lesions.  Musculoskeletal: Negative for arthritis, joint pain, muscle cramps, myalgias, neck pain and stiffness.  Gastrointestinal: Negative for abdominal pain, bowel incontinence, diarrhea and excessive appetite.  Genitourinary: Negative for  decreased libido, genital sores and incomplete emptying.  Neurological: Negative for brief paralysis, focal weakness, headaches and loss of balance.  Psychiatric/Behavioral: Negative for altered mental status, depression and suicidal ideas.  Allergic/Immunologic: Negative for HIV exposure and persistent infections.    EKGs/Labs/Other Studies Reviewed:    The following studies were reviewed today:   EKG:  The ekg ordered today demonstrates sinus rhythm heart rate 72 bpm, first-degree AV block with left axis deviation, compared to EKG done on November 26, 2018 sinus arrhythmia no longer exists.  CCTA Aorta:05/10/19  Normal size. Calcification of the aortic root and descending aorta. No dissection. Aortic Valve:  Trileaflet.  No calcifications. Coronary Arteries:  Normal coronary origin.  CO-dominance. RCA is a large dominant artery that gives rise to PDA and PLVB. There is a mild (25-49%) mixed plaque in the proximal RCA. The mid and distal portion is not well visualized due to blooming artifact. Left main is a large artery that gives rise to LAD and LCX arteries. The left main with a mild (25-49%) mixed plaque.  LAD is a large vessel. There is a mild (25-49%) calcified plaque in the proximal LAD. The mid LAD with moderate (50-69%) calcified plaque. The mid-distal portion of the vessel with mild (25-49%). D1 is a small vessel with no plaque.  LCX is a co-dominant artery that gives rise to one large OM1 branch. The proximal LCX with lumpy-bumpy diffuse mild (25-49%) lesions. The mid LCX with minimal (<24%) soft plaque. OM1 with mild (25-49%) soft plaque.  Other findings:  Normal pulmonary vein drainage into the left atrium.  Normal left atrial appendage without a thrombus.  Normal size of the pulmonary artery.  IMPRESSION: 1. Coronary calcium score of 1067. This was 16 percentile for age and sex matched control.  2. Normal coronary origin with Co-Dominant system.  3.   Moderate CAD.  CADRADS 3.  This study will be sent for FFRct.  4.  Aorta root and descending aorta atherosclerosis.  Recent Labs: 02/24/2019: Magnesium 1.8 05/06/2019: BUN 15; Creatinine, Ser 0.89; Potassium 4.5; Sodium 139  Recent Lipid Panel No results found for: CHOL, TRIG, HDL, CHOLHDL, VLDL, LDLCALC, LDLDIRECT  Physical Exam:    VS:  BP 132/70 (BP Location: Right Arm, Patient Position: Sitting, Cuff Size: Normal)   Pulse 72   Ht 5\' 10"  (1.778 m)   Wt 273 lb (123.8 kg)   SpO2 98%   BMI 39.17 kg/m     Wt Readings from Last 3 Encounters:  05/17/19 273 lb (123.8 kg)  04/01/19 269 lb (122 kg)  02/24/19 271 lb (122.9 kg)     GEN: Well nourished, well developed in no acute distress HEENT: Normal NECK: No JVD; No carotid bruits LYMPHATICS: No lymphadenopathy CARDIAC: S1S2 noted,RRR, no murmurs, rubs, gallops RESPIRATORY:  Clear to auscultation without rales, wheezing or rhonchi  ABDOMEN: Soft, non-tender, non-distended, +bowel sounds, no guarding. EXTREMITIES: trace ankle edema, No cyanosis, no clubbing MUSCULOSKELETAL:  No deformity  SKIN: Warm and dry NEUROLOGIC:  Alert and oriented x 3, non-focal PSYCHIATRIC:  Normal affect, good insight  ASSESSMENT:    1. Abnormal FFRCT   2. Coronary artery disease involving native coronary artery of native heart, angina presence unspecified   3. Hypertension, benign   4. Type 2 diabetes mellitus without complication, without long-term current use of insulin (HCC)   5. Mixed hyperlipidemia   6. Morbid obesity (HCC)    PLAN:     His coronary CTA showed moderate coronary disease, prompting FFR CT which also was  abnormal with flow-limiting lesions in the LAD and left circumflex arteries.  At this time is recommended that we pursue further evaluation with cardiac catheterization to determine the need for revascularization.  I have discussed this with the patient.  The patient understands that risks include but are not limited to stroke  (1 in 1000), death (1 in 1000), kidney failure [usually temporary] (1 in 500), bleeding (1 in 200), allergic reaction [possibly serious] (1 in 200), and agrees to proceed.  In terms of his newly diagnosed coronary artery disease I am going to start the patient on aspirin 81 mg daily today, there are no contraindications for this medication.  He will continue on his atorvastatin 80 mg daily.  His recent LDL in January 2021 was 54 therefore he will remain on this current dose of atorvastatin (LDL goal is less than 70).  Hypertension his blood pressure is acceptable in the office today.  Hyperlipidemia-continue with his Lipitor 80 mg daily.  Obesity-the patient understands the need to lose weight with diet and exercise. We have discussed specific strategies for this.  Blood work will be done today which will include BMP, mag and CBC.  The patient is in agreement with the above plan. The patient left the office in stable condition.  The patient will follow up in 2 week post left heart cath.   Medication Adjustments/Labs and Tests Ordered: Current medicines are reviewed at length with the patient today.  Concerns regarding medicines are outlined above.  Orders Placed This Encounter  Procedures  . Basic metabolic panel  . CBC  . EKG 12-Lead   Meds ordered this encounter  Medications  . aspirin EC 81 MG tablet    Sig: Take 1 tablet (81 mg total) by mouth daily.    Dispense:  90 tablet    Refill:  3    Patient Instructions  Medication Instructions:  Your physician has recommended you make the following change in your medication:  1.  START Aspirin 81 mg taking 1 daily  *If you need a refill on your cardiac medications before your next appointment, please call your pharmacy*   Lab Work: TODAY:  BMET & CBC  If you have labs (blood work) drawn today and your tests are completely normal, you will receive your results only by: Marland Kitchen MyChart Message (if you have MyChart) OR . A paper copy  in the mail If you have any lab test that is abnormal or we need to change your treatment, we will call you to review the results.   Testing/Procedures: Your physician has requested that you have a cardiac catheterization. Cardiac catheterization is used to diagnose and/or treat various heart conditions. Doctors may recommend this procedure for a number of different reasons. The most common reason is to evaluate chest pain. Chest pain can be a symptom of coronary artery disease (CAD), and cardiac catheterization can show whether plaque is narrowing or blocking your heart's arteries. This procedure is also used to evaluate the valves, as well as measure the blood flow and oxygen levels in different parts of your heart. For further information please visit [email or web address] Please follow instruction sheet, BELOW:     [facility] CARDIOVASCULAR DIVISION [facility] AT [place] [address] [place] [address] Dept: [phone] Loc: [phone]  [name] Williams  05/17/2019  You are scheduled for a Cardiac Catheterization on Friday, April 16 with Dr. Lorine Bears.  1. Please arrive at the [facility] (Main Entrance A) at [facility]: [address] [place], [place] [address] at 7:00 AM (This time is two hours before your procedure to ensure your preparation). Free valet parking service is available.   Special note: Every effort is made to have your procedure done on time. Please understand that emergencies sometimes delay scheduled procedures.  2. Diet: Do not eat solid foods after midnight.  The patient may have clear liquids until 5am upon the day of the procedure.  3. Labs: You will need to have blood drawn on TODAY   Tomorrow, Wednesday, 05/18/2019: Go to [facility], [address], [place] at 12:00 for COVID19 TESTING.  Once  you have been tested, you will be required to go home and quarantine until after your  procedure.  NO VISITORS, OTHER THAN THE ONES THAT LIVE IN YOUR HOME, PLEASE   4. Medication instructions in preparation for your procedure:   Contrast Allergy: No    Stop taking, Lasix (Furosemide)  Friday, April 16,  Take only 0 units of insulin the night before your procedure. Do not take any insulin on the day of the procedure.  Do not take Diabetes Med Glucophage (Metformin) on the day of the procedure and HOLD 48 HOURS AFTER THE PROCEDURE.  On the morning of your procedure, take your Aspirin and any morning medicines NOT listed above.  You may use sips of water.  5. Plan for one night stay--bring personal belongings. 6. Bring a current list of your medications and current insurance cards. 7. You MUST have a responsible person to drive you home. 8. Someone MUST be with you the first 24 hours after you arrive home or your discharge will be delayed. 9. Please wear clothes that are easy to get on and off and wear slip-on shoes.  Thank you for allowing [place] to care for you!   -- [facility]      Follow-Up: At [facility], you and your health needs are our priority.  As part of our continuing mission to provide you with exceptional heart care, we have created designated Provider Care Teams.  These Care Teams include your primary Cardiologist (physician) and Advanced Practice Providers (APPs -  Physician Assistants and Nurse Practitioners) who all work together to provide you with the care you need, when you need it.  We recommend signing up for the patient portal called "MyChart".  Sign up information is provided on this After Visit Summary.  MyChart is used to connect with patients for Virtual Visits (Telemedicine).  Patients are able to view lab/test results, encounter notes, upcoming appointments, etc.  Non-urgent messages can be sent to your provider as well.   To learn more about what you can do with MyChart, go to [email or web address].     Your next appointment:   2 week(s)  06/08/19  2:15  ARRIVE AT 2:00   The format for your next appointment:   In Person  Provider:   Thomasene Ripple, DO   Other Instructions      Adopting a Healthy Lifestyle.  Know what a healthy weight is for you (roughly BMI <25) and aim to maintain this   Aim for 7+ servings of fruits and vegetables daily   65-80+ fluid ounces of water or unsweet tea for healthy kidneys   Limit to max 1 drink of alcohol per day; avoid smoking/tobacco   Limit animal fats in diet for cholesterol and heart health - choose grass fed whenever available   Avoid highly processed foods, and foods high in saturated/trans fats   Aim for low stress - take time to unwind and care for your mental health   Aim for 150 min of moderate intensity exercise weekly for heart health, and weights twice weekly for bone health   Aim for 7-9 hours of sleep daily   When it comes to diets, agreement about the perfect plan isnt easy to find, even among the experts. Experts at the [facility] of [employer] developed an idea known as the Healthy Eating Plate. Just imagine a plate divided into logical, healthy portions.   The emphasis is on diet quality:   Load up on  vegetables and fruits - one-half of your plate: Aim for color and variety, and remember that potatoes dont count.   Go for whole grains - one-quarter of your plate: Whole wheat, barley, wheat berries, quinoa, oats, brown rice, and foods made with them. If you want pasta, go with whole wheat pasta.   Protein power - one-quarter of your plate: Fish, chicken, beans, and nuts are all healthy, versatile protein sources. Limit red meat.   The diet, however, does go beyond the plate, offering a few other suggestions.   Use healthy plant oils, such as olive, canola, soy, corn, sunflower and peanut. Check the labels, and avoid partially hydrogenated oil, which have unhealthy trans fats.   If youre thirsty, drink  water. Coffee and tea are good in moderation, but skip sugary drinks and limit milk and dairy products to one or two daily servings.   The type of carbohydrate in the diet is more important than the amount. Some sources of carbohydrates, such as vegetables, fruits, whole grains, and beans-are healthier than others.   Finally, stay active  Signed, Thomasene Ripple, DO  05/17/2019 8:43 PM    [facility]

## 2019-05-18 ENCOUNTER — Inpatient Hospital Stay (HOSPITAL_COMMUNITY): Admission: RE | Admit: 2019-05-18 | Payer: Medicare PPO | Source: Ambulatory Visit

## 2019-05-18 ENCOUNTER — Telehealth: Payer: Self-pay | Admitting: Cardiology

## 2019-05-18 ENCOUNTER — Telehealth: Payer: Self-pay

## 2019-05-18 ENCOUNTER — Other Ambulatory Visit: Payer: Self-pay

## 2019-05-18 ENCOUNTER — Observation Stay (HOSPITAL_COMMUNITY)
Admission: EM | Admit: 2019-05-18 | Discharge: 2019-05-19 | Disposition: A | Discharge status: Home / Self Care | Payer: Medicare PPO | Attending: Internal Medicine | Admitting: Internal Medicine

## 2019-05-18 ENCOUNTER — Encounter (HOSPITAL_COMMUNITY): Payer: Self-pay

## 2019-05-18 ENCOUNTER — Telehealth: Payer: Self-pay | Admitting: Gastroenterology

## 2019-05-18 DIAGNOSIS — E785 Hyperlipidemia, unspecified: Secondary | ICD-10-CM | POA: Diagnosis not present

## 2019-05-18 DIAGNOSIS — N400 Enlarged prostate without lower urinary tract symptoms: Secondary | ICD-10-CM | POA: Diagnosis not present

## 2019-05-18 DIAGNOSIS — E1142 Type 2 diabetes mellitus with diabetic polyneuropathy: Secondary | ICD-10-CM | POA: Insufficient documentation

## 2019-05-18 DIAGNOSIS — F329 Major depressive disorder, single episode, unspecified: Secondary | ICD-10-CM | POA: Insufficient documentation

## 2019-05-18 DIAGNOSIS — Z6839 Body mass index (BMI) 39.0-39.9, adult: Secondary | ICD-10-CM | POA: Insufficient documentation

## 2019-05-18 DIAGNOSIS — I110 Hypertensive heart disease with heart failure: Secondary | ICD-10-CM | POA: Diagnosis not present

## 2019-05-18 DIAGNOSIS — F419 Anxiety disorder, unspecified: Secondary | ICD-10-CM | POA: Diagnosis not present

## 2019-05-18 DIAGNOSIS — Z20822 Contact with and (suspected) exposure to covid-19: Secondary | ICD-10-CM | POA: Diagnosis not present

## 2019-05-18 DIAGNOSIS — K219 Gastro-esophageal reflux disease without esophagitis: Secondary | ICD-10-CM | POA: Diagnosis not present

## 2019-05-18 DIAGNOSIS — I2510 Atherosclerotic heart disease of native coronary artery without angina pectoris: Secondary | ICD-10-CM | POA: Insufficient documentation

## 2019-05-18 DIAGNOSIS — E11649 Type 2 diabetes mellitus with hypoglycemia without coma: Secondary | ICD-10-CM | POA: Insufficient documentation

## 2019-05-18 DIAGNOSIS — Z880 Allergy status to penicillin: Secondary | ICD-10-CM | POA: Insufficient documentation

## 2019-05-18 DIAGNOSIS — Z794 Long term (current) use of insulin: Secondary | ICD-10-CM | POA: Insufficient documentation

## 2019-05-18 DIAGNOSIS — I5032 Chronic diastolic (congestive) heart failure: Secondary | ICD-10-CM | POA: Insufficient documentation

## 2019-05-18 DIAGNOSIS — G4733 Obstructive sleep apnea (adult) (pediatric): Secondary | ICD-10-CM | POA: Diagnosis not present

## 2019-05-18 DIAGNOSIS — I10 Essential (primary) hypertension: Secondary | ICD-10-CM | POA: Diagnosis not present

## 2019-05-18 DIAGNOSIS — D649 Anemia, unspecified: Secondary | ICD-10-CM

## 2019-05-18 DIAGNOSIS — Z7982 Long term (current) use of aspirin: Secondary | ICD-10-CM | POA: Insufficient documentation

## 2019-05-18 DIAGNOSIS — Z791 Long term (current) use of non-steroidal anti-inflammatories (NSAID): Secondary | ICD-10-CM | POA: Insufficient documentation

## 2019-05-18 DIAGNOSIS — M159 Polyosteoarthritis, unspecified: Secondary | ICD-10-CM | POA: Insufficient documentation

## 2019-05-18 DIAGNOSIS — Z79899 Other long term (current) drug therapy: Secondary | ICD-10-CM | POA: Diagnosis not present

## 2019-05-18 DIAGNOSIS — Z881 Allergy status to other antibiotic agents status: Secondary | ICD-10-CM | POA: Diagnosis not present

## 2019-05-18 DIAGNOSIS — K625 Hemorrhage of anus and rectum: Secondary | ICD-10-CM | POA: Insufficient documentation

## 2019-05-18 DIAGNOSIS — Z888 Allergy status to other drugs, medicaments and biological substances status: Secondary | ICD-10-CM | POA: Insufficient documentation

## 2019-05-18 DIAGNOSIS — D509 Iron deficiency anemia, unspecified: Principal | ICD-10-CM | POA: Insufficient documentation

## 2019-05-18 DIAGNOSIS — E162 Hypoglycemia, unspecified: Secondary | ICD-10-CM

## 2019-05-18 DIAGNOSIS — Z87891 Personal history of nicotine dependence: Secondary | ICD-10-CM | POA: Insufficient documentation

## 2019-05-18 HISTORY — DX: Anemia, unspecified: D64.9

## 2019-05-18 LAB — COMPREHENSIVE METABOLIC PANEL
ALT: 16 U/L (ref 0–44)
AST: 18 U/L (ref 15–41)
Albumin: 3.3 g/dL — ABNORMAL LOW (ref 3.5–5.0)
Alkaline Phosphatase: 63 U/L (ref 38–126)
Anion gap: 9 (ref 5–15)
BUN: 12 mg/dL (ref 8–23)
CO2: 23 mmol/L (ref 22–32)
Calcium: 9.3 mg/dL (ref 8.9–10.3)
Chloride: 107 mmol/L (ref 98–111)
Creatinine, Ser: 0.8 mg/dL (ref 0.61–1.24)
GFR calc Af Amer: >60 mL/min (ref >60)
GFR calc non Af Amer: >60 mL/min (ref >60)
Glucose, Bld: 43 mg/dL — CL (ref 70–99)
Potassium: 4 mmol/L (ref 3.5–5.1)
Sodium: 139 mmol/L (ref 135–145)
Total Bilirubin: 0.8 mg/dL (ref 0.3–1.2)
Total Protein: 7.5 g/dL (ref 6.5–8.1)

## 2019-05-18 LAB — CBC
HCT: 26.4 % — ABNORMAL LOW (ref 39.0–52.0)
HCT: 29.2 % — ABNORMAL LOW (ref 39.0–52.0)
Hematocrit: 25.5 % — ABNORMAL LOW (ref 37.5–51.0)
Hemoglobin: 6.8 g/dL — CL (ref 13.0–17.7)
Hemoglobin: 6.6 g/dL — CL (ref 13.0–17.0)
Hemoglobin: 7.9 g/dL — ABNORMAL LOW (ref 13.0–17.0)
MCH: 17.8 pg — ABNORMAL LOW (ref 26.6–33.0)
MCH: 18.1 pg — ABNORMAL LOW (ref 26.0–34.0)
MCH: 19.8 pg — ABNORMAL LOW (ref 26.0–34.0)
MCHC: 26.7 g/dL — ABNORMAL LOW (ref 31.5–35.7)
MCHC: 25 g/dL — ABNORMAL LOW (ref 30.0–36.0)
MCHC: 27.1 g/dL — ABNORMAL LOW (ref 30.0–36.0)
MCV: 67 fL — ABNORMAL LOW (ref 79–97)
MCV: 72.3 fL — ABNORMAL LOW (ref 80.0–100.0)
MCV: 73 fL — ABNORMAL LOW (ref 80.0–100.0)
Platelets: 486 10*3/uL — ABNORMAL HIGH (ref 150–450)
Platelets: 452 10*3/uL — ABNORMAL HIGH (ref 150–400)
Platelets: 429 10*3/uL — ABNORMAL HIGH (ref 150–400)
RBC: 3.82 x10E6/uL — ABNORMAL LOW (ref 4.14–5.80)
RBC: 3.65 MIL/uL — ABNORMAL LOW (ref 4.22–5.81)
RBC: 4 MIL/uL — ABNORMAL LOW (ref 4.22–5.81)
RDW: 19.8 % — ABNORMAL HIGH (ref 11.6–15.4)
RDW: 20.9 % — ABNORMAL HIGH (ref 11.5–15.5)
RDW: 22.3 % — ABNORMAL HIGH (ref 11.5–15.5)
WBC: 11.5 10*3/uL — ABNORMAL HIGH (ref 3.4–10.8)
WBC: 11.5 10*3/uL — ABNORMAL HIGH (ref 4.0–10.5)
WBC: 11 10*3/uL — ABNORMAL HIGH (ref 4.0–10.5)
nRBC: 0 % (ref 0.0–0.2)
nRBC: 0 % (ref 0.0–0.2)

## 2019-05-18 LAB — BASIC METABOLIC PANEL
BUN/Creatinine Ratio: 17 (ref 10–24)
BUN: 16 mg/dL (ref 8–27)
CO2: 23 mmol/L (ref 20–29)
Calcium: 9.6 mg/dL (ref 8.6–10.2)
Chloride: 102 mmol/L (ref 96–106)
Creatinine, Ser: 0.93 mg/dL (ref 0.76–1.27)
GFR calc Af Amer: 96 mL/min/{1.73_m2} (ref >59)
GFR calc non Af Amer: 83 mL/min/{1.73_m2} (ref >59)
Glucose: 165 mg/dL — ABNORMAL HIGH (ref 65–99)
Potassium: 4.9 mmol/L (ref 3.5–5.2)
Sodium: 139 mmol/L (ref 134–144)

## 2019-05-18 LAB — URINALYSIS, COMPLETE (UACMP) WITH MICROSCOPIC
Bacteria, UA: NONE SEEN
Bilirubin Urine: NEGATIVE
Glucose, UA: NEGATIVE mg/dL
Hgb urine dipstick: NEGATIVE
Ketones, ur: NEGATIVE mg/dL
Leukocytes,Ua: NEGATIVE
Nitrite: NEGATIVE
Protein, ur: NEGATIVE mg/dL
Specific Gravity, Urine: 1.015 (ref 1.005–1.030)
pH: 5 (ref 5.0–8.0)

## 2019-05-18 LAB — CBG MONITORING, ED
Glucose-Capillary: 43 mg/dL — CL (ref 70–99)
Glucose-Capillary: 129 mg/dL — ABNORMAL HIGH (ref 70–99)
Glucose-Capillary: 128 mg/dL — ABNORMAL HIGH (ref 70–99)

## 2019-05-18 LAB — POC OCCULT BLOOD, ED: Fecal Occult Bld: NEGATIVE

## 2019-05-18 LAB — PREPARE RBC (CROSSMATCH)

## 2019-05-18 LAB — RETICULOCYTES
Immature Retic Fract: 27.9 % — ABNORMAL HIGH (ref 2.3–15.9)
RBC.: 3.67 MIL/uL — ABNORMAL LOW (ref 4.22–5.81)
Retic Count, Absolute: 96.9 10*3/uL (ref 19.0–186.0)
Retic Ct Pct: 2.6 % (ref 0.4–3.1)

## 2019-05-18 LAB — FERRITIN: Ferritin: 4 ng/mL — ABNORMAL LOW (ref 24–336)

## 2019-05-18 LAB — IRON AND TIBC
Iron: 63 ug/dL (ref 45–182)
Saturation Ratios: 10 % — ABNORMAL LOW (ref 17.9–39.5)
TIBC: 659 ug/dL — ABNORMAL HIGH (ref 250–450)
UIBC: 596 ug/dL

## 2019-05-18 LAB — SARS CORONAVIRUS 2 (TAT 6-24 HRS): SARS Coronavirus 2: NEGATIVE

## 2019-05-18 LAB — ABO/RH: ABO/RH(D): AB NEG

## 2019-05-18 LAB — GLUCOSE, CAPILLARY: Glucose-Capillary: 186 mg/dL — ABNORMAL HIGH (ref 70–99)

## 2019-05-18 MED ORDER — POLYETHYLENE GLYCOL 3350 17 G PO PACK: 17.0000 g | PACK | Freq: Every day | ORAL | Status: DC | PRN

## 2019-05-18 MED ORDER — DEXTROSE 50 % IV SOLN
50.0000 mL | Freq: Once | INTRAVENOUS | Status: AC
  Administered 2019-05-18: 50 mL via INTRAVENOUS
  Filled 2019-05-18: qty 50

## 2019-05-18 MED ORDER — ACETAMINOPHEN 650 MG RE SUPP: 650.0000 mg | Freq: Four times a day (QID) | RECTAL | Status: DC | PRN

## 2019-05-18 MED ORDER — ATORVASTATIN CALCIUM 80 MG PO TABS
80.0000 mg | ORAL_TABLET | Freq: Every day | ORAL | Status: DC
  Administered 2019-05-18: 80 mg via ORAL
  Filled 2019-05-18: qty 1

## 2019-05-18 MED ORDER — CITALOPRAM HYDROBROMIDE 20 MG PO TABS
20.0000 mg | ORAL_TABLET | Freq: Every day | ORAL | Status: DC
  Administered 2019-05-18 – 2019-05-19 (×2): 20 mg via ORAL
  Filled 2019-05-18: qty 1
  Filled 2019-05-18: qty 2

## 2019-05-18 MED ORDER — LOSARTAN POTASSIUM 50 MG PO TABS: 100.0000 mg | ORAL_TABLET | Freq: Every day | ORAL | Status: DC

## 2019-05-18 MED ORDER — MONTELUKAST SODIUM 10 MG PO TABS
10.0000 mg | ORAL_TABLET | Freq: Every day | ORAL | Status: DC
  Administered 2019-05-18 – 2019-05-19 (×2): 10 mg via ORAL
  Filled 2019-05-18 (×3): qty 1

## 2019-05-18 MED ORDER — GEMFIBROZIL 600 MG PO TABS
600.0000 mg | ORAL_TABLET | Freq: Two times a day (BID) | ORAL | Status: DC
  Administered 2019-05-19: 600 mg via ORAL
  Filled 2019-05-18 (×4): qty 1

## 2019-05-18 MED ORDER — TAMSULOSIN HCL 0.4 MG PO CAPS
0.4000 mg | ORAL_CAPSULE | Freq: Every day | ORAL | Status: DC
  Administered 2019-05-18 – 2019-05-19 (×2): 0.4 mg via ORAL
  Filled 2019-05-18 (×2): qty 1

## 2019-05-18 MED ORDER — METOPROLOL SUCCINATE ER 25 MG PO TB24
12.5000 mg | ORAL_TABLET | Freq: Every day | ORAL | Status: DC
  Administered 2019-05-19: 12.5 mg via ORAL
  Filled 2019-05-18: qty 1

## 2019-05-18 MED ORDER — ONDANSETRON HCL 4 MG/2ML IJ SOLN: 4.0000 mg | Freq: Four times a day (QID) | INTRAMUSCULAR | Status: DC | PRN

## 2019-05-18 MED ORDER — ONDANSETRON HCL 4 MG PO TABS: 4.0000 mg | ORAL_TABLET | Freq: Four times a day (QID) | ORAL | Status: DC | PRN

## 2019-05-18 MED ORDER — DEXTROSE 50 % IV SOLN: 1.0000 | INTRAVENOUS | Status: DC | PRN

## 2019-05-18 MED ORDER — INSULIN ASPART 100 UNIT/ML ~~LOC~~ SOLN
0.0000 [IU] | SUBCUTANEOUS | Status: DC
  Administered 2019-05-18 – 2019-05-19 (×2): 3 [IU] via SUBCUTANEOUS
  Administered 2019-05-19: 5 [IU] via SUBCUTANEOUS

## 2019-05-18 MED ORDER — ADULT MULTIVITAMIN W/MINERALS CH
1.0000 | ORAL_TABLET | Freq: Every day | ORAL | Status: DC
  Administered 2019-05-19: 1 via ORAL
  Filled 2019-05-18: qty 1

## 2019-05-18 MED ORDER — SODIUM CHLORIDE 0.9 % IV SOLN
10.0000 mL/h | Freq: Once | INTRAVENOUS | Status: AC
  Administered 2019-05-18: 10 mL/h via INTRAVENOUS

## 2019-05-18 MED ORDER — DEXTROSE 10 % IV SOLN: INTRAVENOUS | Status: DC

## 2019-05-18 MED ORDER — LORATADINE 10 MG PO TABS
10.0000 mg | ORAL_TABLET | Freq: Every day | ORAL | Status: DC
  Administered 2019-05-18 – 2019-05-19 (×2): 10 mg via ORAL
  Filled 2019-05-18 (×2): qty 1

## 2019-05-18 MED ORDER — NAPHAZOLINE-PHENIRAMINE 0.025-0.3 % OP SOLN
1.0000 [drp] | Freq: Three times a day (TID) | OPHTHALMIC | Status: DC | PRN
  Administered 2019-05-19: 1 [drp] via OPHTHALMIC
  Filled 2019-05-18: qty 15

## 2019-05-18 MED ORDER — ACETAMINOPHEN 325 MG PO TABS: 650.0000 mg | ORAL_TABLET | Freq: Four times a day (QID) | ORAL | Status: DC | PRN

## 2019-05-18 MED ORDER — PANTOPRAZOLE SODIUM 40 MG PO TBEC
40.0000 mg | DELAYED_RELEASE_TABLET | Freq: Every day | ORAL | Status: DC
  Administered 2019-05-18 – 2019-05-19 (×2): 40 mg via ORAL
  Filled 2019-05-18 (×3): qty 1

## 2019-05-18 MED ORDER — DEXTROSE 50 % IV SOLN
12.5000 g | Freq: Once | INTRAVENOUS | Status: AC
  Administered 2019-05-18: 12.5 g via INTRAVENOUS
  Filled 2019-05-18: qty 50

## 2019-05-18 NOTE — ED Provider Notes (Signed)
[facility] EMERGENCY DEPARTMENT Provider Note   CSN: [id number] Arrival date & time: 05/18/19  1129     History Chief Complaint  Patient presents with  . Anemia  . Rectal Bleeding    [name] Williams is a 69 y.o. male.  HPI   69 year old male presenting with anemia.  It sounds like this is incidentally noted on recent labs.  Recently seen by cardiology because of chest pain and shortness of breath.  He reportedly had a pharmacological stress test that was normal with no evidence of ischemia but had subsequent CTA of his coronaries which showed moderate coronary disease and flow-limiting lesions in the LAD and left circumflex.  He is scheduled for cardiac catheterization in 2 days.  Anemia noted on preadmission testing?  Hemoglobin noted to be 6.8.  Patient himself is not a great historian.  He does report a history of intermittent rectal bleeding though.  Most recently had bright red blood in his stool after being constipated a few days ago.  He has not noticed any blood since then.  He is on aspirin.  Past Medical History:  Diagnosis Date  . Anxiety disorder   . BPH (benign prostatic hyperplasia)   . Colon polyps   . DDD (degenerative disc disease), cervical   . Depression   . Generalized osteoarthritis of multiple sites   . Gout   . Hyperlipidemia   . Hypertension, benign   . Laryngopharyngeal reflux   . Tobacco abuse   . Uncontrolled type 2 diabetes mellitus with peripheral neuropathy [facility])     Patient Active Problem List   Diagnosis Date Noted  . Dyspnea 11/26/2018  . Type 2 diabetes mellitus without complication, without long-term current use of insulin ([place]) 11/26/2018  . Morbid obesity ([place]) 11/26/2018  . Sinus arrhythmia 11/26/2018  . Uncontrolled type 2 diabetes mellitus with peripheral neuropathy ([place])   . Tobacco abuse   . Laryngopharyngeal reflux   . Hypertension, benign   . Hyperlipidemia   . Gout   . Generalized osteoarthritis of  multiple sites   . Depression   . DDD (degenerative disc disease), cervical   . Colon polyps   . BPH (benign prostatic hyperplasia)   . Anxiety disorder     Past Surgical History:  Procedure Laterality Date  . HERNIA REPAIR  1998   Abdominal  . MINOR HEMORRHOIDECTOMY  1998  . NASAL SINUS SURGERY  1978  . SHOULDER SURGERY Left    X2  . TONSILLECTOMY  1967       Family History  Problem Relation Age of Onset  . Stroke Father   . Lung cancer Father   . Alcohol abuse Father   . Diabetes Brother     Social History   Tobacco Use  . Smoking status: Former Smoker    Quit date: 2015    Years since quitting: 6.2  . Smokeless tobacco: Former Systems developer    Types: Chew  Substance Use Topics  . Alcohol use: Not Currently  . Drug use: Never    Home Medications Prior to Admission medications   Medication Sig Start Date End Date Taking? Authorizing Provider  amLODipine (NORVASC) 5 MG tablet Take 5 mg by mouth daily.  05/12/19   [provider]  aspirin EC 81 MG tablet Take 1 tablet (81 mg total) by mouth daily. 05/17/19   Tobb, Kardie, DO  atorvastatin (LIPITOR) 80 MG tablet Take 80 mg by mouth at bedtime.     [provider]  cetirizine (ZYRTEC) 10 MG tablet Take 10 mg by mouth at bedtime.     [provider]  citalopram (CELEXA) 20 MG tablet Take 20 mg by mouth daily.    [provider]  furosemide (LASIX) 20 MG tablet Take 1 tablet (20 mg total) by mouth daily. Patient not taking: Reported on 05/18/2019 02/24/19 05/25/19  Tobb, Godfrey Pick, DO  gemfibrozil (LOPID) 600 MG tablet Take 600 mg by mouth 2 (two) times daily before a meal.    [provider]  glimepiride (AMARYL) 4 MG tablet Take 4 mg by mouth 2 (two) times daily.    [provider]  losartan (COZAAR) 100 MG tablet Take 100 mg by mouth at bedtime.  04/21/17   [provider]  meloxicam (MOBIC) 7.5 MG tablet Take 7.5 mg by mouth 2 (two) times daily.    [provider]  metFORMIN (GLUCOPHAGE) 1000 MG tablet Take 1,000 mg by mouth 2 (two) times daily with a meal.    [provider]  metoprolol succinate (TOPROL XL) 25 MG 24 hr tablet Take 0.5 tablets (12.5 mg total) by mouth daily. 04/01/19   Tobb, Kardie, DO  montelukast (SINGULAIR) 10 MG tablet Take 10 mg by mouth daily.  04/15/17   [provider]  Multiple Vitamins-Minerals (ADULT GUMMY PO) Take 2 tablets by mouth daily.    [provider]  naphazoline-pheniramine (NAPHCON-A) 0.025-0.3 % ophthalmic solution Place 1 drop into both eyes 3 (three) times daily as needed for eye irritation (dry/irritated/allergy eyes.).    [provider]  nitroGLYCERIN (NITROSTAT) 0.4 MG SL tablet Place 1 tablet (0.4 mg total) under the tongue every 5 (five) minutes as needed for chest pain. 11/26/18 05/17/21  Tobb, Kardie, DO  NOVOLOG FLEXPEN 100 UNIT/ML FlexPen Inject 20 Units into the skin in the morning, at noon, and at bedtime.  02/23/19   [provider]  omeprazole (PRILOSEC) 40 MG capsule Take 40 mg by mouth every evening.     [provider]  potassium chloride SA (KLOR-CON) 20 MEQ tablet Take 1 tablet (20 mEq total) by mouth daily. 02/24/19   Tobb, Kardie, DO  tamsulosin (FLOMAX) 0.4 MG CAPS capsule Take 0.4 mg by mouth daily.    [provider]  TRESIBA FLEXTOUCH 100 UNIT/ML FlexTouch Pen Inject 60 Units into the skin at bedtime.  04/22/19   [provider]    Allergies    Pseudoephedrine, Penicillins, and Tetracyclines & related  Review of Systems   Review of Systems All systems reviewed and negative, other than as noted in HPI.  Physical Exam Updated Vital Signs BP (!) 136/48   Pulse 69   Temp 98.1 F (36.7 C) (Oral)   Resp 18   Ht 5\' 10"  (1.778 m)   Wt 123.8 kg   SpO2 96%   BMI 39.17 kg/m   Physical Exam Vitals and nursing note reviewed.  Constitutional:      General: He is not in acute distress.    Appearance: He is  well-developed.  HENT:     Head: Normocephalic and atraumatic.  Eyes:     General:        Right eye: No discharge.        Left eye: No discharge.     Conjunctiva/sclera: Conjunctivae normal.  Cardiovascular:     Rate and Rhythm: Normal rate and regular rhythm.     Heart sounds: Normal heart sounds. No murmur. No friction rub. No gallop.   Pulmonary:  Effort: Pulmonary effort is normal. No respiratory distress.     Breath sounds: Normal breath sounds.  Abdominal:     General: There is no distension.     Palpations: Abdomen is soft.     Tenderness: There is no abdominal tenderness.  Musculoskeletal:        General: No tenderness.     Cervical back: Neck supple.  Skin:    General: Skin is warm and dry.     Coloration: Skin is pale.  Neurological:     Mental Status: He is alert.  Psychiatric:        Behavior: Behavior normal.     Comments: Answering questions appropriately but slow to gather thoughts and difficulty with recent recall     ED Results / Procedures / Treatments   Labs (all labs ordered are listed, but only abnormal results are displayed) Labs Reviewed  COMPREHENSIVE METABOLIC PANEL - Abnormal; Notable for the following components:      Result Value   Glucose, Bld 43 (*)    Albumin 3.3 (*)    All other components within normal limits  CBC - Abnormal; Notable for the following components:   WBC 11.5 (*)    RBC 3.65 (*)    Hemoglobin 6.6 (*)    HCT 26.4 (*)    MCV 72.3 (*)    MCH 18.1 (*)    MCHC 25.0 (*)    RDW 20.9 (*)    Platelets 452 (*)    All other components within normal limits  BASIC METABOLIC PANEL - Abnormal; Notable for the following components:   Glucose, Bld 102 (*)    All other components within normal limits  CBC - Abnormal; Notable for the following components:   RBC 4.03 (*)    Hemoglobin 7.6 (*)    HCT 29.1 (*)    MCV 72.2 (*)    MCH 18.9 (*)    MCHC 26.1 (*)    RDW 22.1 (*)    Platelets 436 (*)    All other components within  normal limits  HEMOGLOBIN A1C - Abnormal; Notable for the following components:   Hgb A1c MFr Bld 6.8 (*)    All other components within normal limits  FERRITIN - Abnormal; Notable for the following components:   Ferritin 4 (*)    All other components within normal limits  IRON AND TIBC - Abnormal; Notable for the following components:   TIBC 659 (*)    Saturation Ratios 10 (*)    All other components within normal limits  RETICULOCYTES - Abnormal; Notable for the following components:   RBC. 3.67 (*)    Immature Retic Fract 27.9 (*)    All other components within normal limits  CBC - Abnormal; Notable for the following components:   WBC 11.0 (*)    RBC 4.00 (*)    Hemoglobin 7.9 (*)    HCT 29.2 (*)    MCV 73.0 (*)    MCH 19.8 (*)    MCHC 27.1 (*)    RDW 22.3 (*)    Platelets 429 (*)    All other components within normal limits  GLUCOSE, CAPILLARY - Abnormal; Notable for the following components:   Glucose-Capillary 186 (*)    All other components within normal limits  GLUCOSE, CAPILLARY - Abnormal; Notable for the following components:   Glucose-Capillary 46 (*)    All other components within normal limits  GLUCOSE, CAPILLARY - Abnormal; Notable for the following components:   Glucose-Capillary 63 (*)  All other components within normal limits  GLUCOSE, CAPILLARY - Abnormal; Notable for the following components:   Glucose-Capillary 107 (*)    All other components within normal limits  GLUCOSE, CAPILLARY - Abnormal; Notable for the following components:   Glucose-Capillary 163 (*)    All other components within normal limits  CBC - Abnormal; Notable for the following components:   Hemoglobin 8.2 (*)    HCT 30.7 (*)    MCV 71.7 (*)    MCH 19.2 (*)    MCHC 26.7 (*)    RDW 22.5 (*)    Platelets 449 (*)    All other components within normal limits  GLUCOSE, CAPILLARY - Abnormal; Notable for the following components:   Glucose-Capillary 212 (*)    All other components  within normal limits  CBG MONITORING, ED - Abnormal; Notable for the following components:   Glucose-Capillary 43 (*)    All other components within normal limits  CBG MONITORING, ED - Abnormal; Notable for the following components:   Glucose-Capillary 129 (*)    All other components within normal limits  CBG MONITORING, ED - Abnormal; Notable for the following components:   Glucose-Capillary 128 (*)    All other components within normal limits  SARS CORONAVIRUS 2 (TAT 6-24 HRS)  URINALYSIS, COMPLETE (UACMP) WITH MICROSCOPIC  HIV ANTIBODY (ROUTINE TESTING W REFLEX)  GLUCOSE, CAPILLARY  POC OCCULT BLOOD, ED  TYPE AND SCREEN  PREPARE RBC (CROSSMATCH)  ABO/RH    EKG EKG Interpretation  Date/Time:  Wednesday May 18 2019 11:30:25 EDT Ventricular Rate:  75 PR Interval:  246 QRS Duration: 88 QT Interval:  408 QTC Calculation: 455 R Axis:   -26 Text Interpretation: Sinus rhythm with 1st degree A-V block Otherwise normal ECG Confirmed by Virgel Manifold [phone]) on 05/18/2019 1:07:45 PM   Radiology No results found.  Procedures Procedures (including critical care time)  CRITICAL CARE Performed by: Virgel Manifold Total critical care time: 35 minutes Critical care time was exclusive of separately billable procedures and treating other patients. Critical care was necessary to treat or prevent imminent or life-threatening deterioration. Critical care was time spent personally by me on the following activities: development of treatment plan with patient and/or surrogate as well as nursing, discussions with consultants, evaluation of patient's response to treatment, examination of patient, obtaining history from patient or surrogate, ordering and performing treatments and interventions, ordering and review of laboratory studies, ordering and review of radiographic studies, pulse oximetry and re-evaluation of patient's condition.   Medications Ordered in ED Medications  0.9 %  sodium  chloride infusion (has no administration in time range)  dextrose 50 % solution 12.5 g (12.5 g Intravenous Given 05/18/19 1235)    ED Course  I have reviewed the triage vital signs and the nursing notes.  Pertinent labs & imaging results that were available during my care of the patient were reviewed by me and considered in my medical decision making (see chart for details).    MDM Rules/Calculators/A&P                      69 year old male with symptomatic anemia.  Initially felt that he was a very poor historian.  Subsequently his glucose came back at 82 which likely explains why he had such difficulty gathering his thoughts on my initial evaluation.  Improved after dextrose. Continue to monitor. Probably symptomatic recently from CAD in the setting of this anemia. Will transfuse. BP fine. He reports previous colonoscopy by  Dr Jackquline Denmark. On ASA.     Final Clinical Impression(s) / ED Diagnoses Final diagnoses:  Symptomatic anemia  Hypoglycemia    Rx / DC Orders ED Discharge Orders    None       Virgel Manifold, MD 05/22/19 1122

## 2019-05-18 NOTE — Telephone Encounter (Signed)
[facility] Consult called on behave of [employer] for a peer to peer number [phone] with Dr. Uvaldo Bristle.  If Dr. Harriet Masson or someone medical can call back by the end of the day.

## 2019-05-18 NOTE — Telephone Encounter (Signed)
Berniece Salines, DO  05/18/2019 8:53 AM EDT    I was able to speak to the patient I notify his hemoglobin is low at 6.8, he needs to go to the emergency department to be evaluated he may need blood transfusion as well will need to be evaluated by GI for possible endoscopy. Let the patient know not to take aspirin as yesterday. And he also is aware that his cardiac catheterization will be on hold at this time to we can understand the source of his anemia.

## 2019-05-18 NOTE — Telephone Encounter (Signed)
Hey Dr. Lyndel Safe- Dr. Sherren Mocha from [facility] called to speak with you. She states she is seeing this patient of Dr. Carolanne Grumbling and it was a urgent matter to speak with you.

## 2019-05-18 NOTE — Telephone Encounter (Signed)
Attempted to call patient. Unable to reach him or leave voicemail.  Urgent referral to GI has been placed per Dr. Harriet Masson.  Patient also needs to discontinue ASA.  If he can Dr. Harriet Masson would like him to go to [facility] ER.

## 2019-05-18 NOTE — Telephone Encounter (Signed)
-----   Message from Berniece Salines, DO sent at 05/18/2019  7:50 AM EDT ----- Please let patient know that his hemoglobin - he may need transfusion. He needs to go the ER. We may have to cancel his cath until we can understand why his hemoglobin in this low.   Please let Dr. Delena Bali nurse know

## 2019-05-18 NOTE — ED Triage Notes (Signed)
Pt sent by doctor for evaluation of anemia, hgb 6.8. Pt also c.o rectal bleeding. Pt very pale in triage, states he feels weak. Pt has a heart cath procedure scheduled for 4/16. Denies chest pain or SOB, pt a.o

## 2019-05-18 NOTE — Telephone Encounter (Addendum)
Routed to Dr Harriet Masson

## 2019-05-18 NOTE — H&P (Signed)
Date: 05/18/2019               Patient Name:  [name] Williams MRN: [id number]  DOB: 1950/12/08 Age / Sex: 69 y.o., male   PCP: Paulina Fusi, MD         Medical Service: Internal Medicine Teaching Service         Attending Physician: Dr. Earl Lagos, MD    First Contact: Dr. Ronelle Nigh Pager: [phone]  Second Contact: Dr. Nedra Hai Pager: [phone]       After Hours (After 5p/  First Contact Pager: [phone]  weekends / holidays): Second Contact Pager: [phone]   Chief Complaint: Fatigue  History of Present Illness:  [name] Williams is a 69 yo M w/ PMH of CAD, BPH, OA, GERD and T2DM who presents with fatigue. He was observed resting comfortably in ED bed. He was examined with his sister present. He mentions that he was in his usual state of health until earlier today when he was noted to be anemic on pre-catheterization labs.  He was told to come to the ED for evaluation.  He mentions that he has never had prior history of bleeding or anemia I in the past.  Denies any frank bleeding episodes.  He mentions seeing bright red blood on his toilet paper 2 days ago after a hard bm but states this is not new as he has hemorrhoids.  He also mentions being patient of Dr. Chales Abrahams and had a colonoscopy within the last 3 years with removal of few polyps.  He does mention some fatigue and weakness but denies any chest pain, palpitations, dyspnea, focal weakness.  On chart review, he presented w/ chest pain and dyspnea and was evaluated by cardiology started in 11/2018 and has been followed with coronary calcium score of 1067 on CT. He was scheduled for left heart cath on 05/20/19 and for pre-eval had cbc drawn which revealed his anemia.  In the ED, he was found to have hgb of 6.6 and was given 2 units of blood. He was also found to be hypoglycemic with cbg of ~40s and was started on D10w infusion. IMTS was consulted for admission.  Meds:  Amlodipine 5mg  daily Asa 81mg  daily Atorvastatin 80mg  qhs Cetirizine  10mg  qhs Citalopram 20mg  daily Furosemide 20mg  daily Gemfibrozil 600mg  BID Glimepride 4mg  BID Losartan 100mg  qhs Meloxican 7.5mg  BID Metformin 1000mg  BID Metoprolol 12.5mg  daily Montelukast 10mg  daily Nitroglycerin 0.4mg  SL Novolog 20 units TID Tamsulosin 0.4mg  daily Tresiba 60 units qhs  Allergies: Allergies as of 05/18/2019 - Review Complete 05/18/2019  Allergen Reaction Noted  . Pseudoephedrine Other (See Comments) 11/26/2018  . Penicillins Rash 11/26/2018  . Tetracyclines & related Rash 11/26/2018   Past Medical History:  Diagnosis Date  . Anxiety disorder   . BPH (benign prostatic hyperplasia)   . Colon polyps   . DDD (degenerative disc disease), cervical   . Depression   . Generalized osteoarthritis of multiple sites   . Gout   . Hyperlipidemia   . Hypertension, benign   . Laryngopharyngeal reflux   . Tobacco abuse   . Uncontrolled type 2 diabetes mellitus with peripheral neuropathy (HCC)    Family History:  No family hx of bleeding/clotting disorder. No family hx of heart dz  Social History:  About 20-30 pack x2 year smoking hx. Quit smoking about 6 years ago. Occasionally drink beer occasionally. Denies any illicit substance use.   Review of Systems: A complete ROS was negative except as per HPI.  Physical Exam: Blood pressure (!) 149/68, pulse 64, temperature 98.5 F (36.9 C), temperature source Oral, resp. rate 16, height 5\' 10"  (1.778 m), weight 123.8 kg, SpO2 99 %. Physical Exam  Constitutional: He is well-developed, well-nourished, and in no distress. No distress.  HENT:  Mouth/Throat: Oropharynx is clear and moist.  Cardiovascular: Normal rate, regular rhythm, normal heart sounds and intact distal pulses.  No murmur heard. Pulmonary/Chest: Effort normal and breath sounds normal. He has no wheezes. He has no rales.  Abdominal: Soft. Bowel sounds are normal. He exhibits no distension. There is no abdominal tenderness.  Genitourinary:     Genitourinary Comments: Number of skin tags and external hemorrhoids noted around rectum. Rectal vault without significant blood or stool. Prostate wnl   Musculoskeletal:        General: Edema (2+ pititng edema up to mid knees) present. Normal range of motion.     Cervical back: Normal range of motion and neck supple.  Skin: Skin is warm and dry. There is pallor.   EKG: personally reviewed my interpretation is normal sinus, prolonged PR consistent w/ 1st degree AV block, no ST changes  CXR: N/A  Assessment & Plan by Problem: Active Problems:   Anemia  [name] Williams is a 69 yo M w/ PMH of CAD, BPH, OA, GERD and T2DM who presents with fatigue.   Symptomatic Anemia Hgb 6.6 on admit. MCV 72.3 No obvious sign of bleed. Unclear chronicity. No prior hemoglobin. POC occult negative. UA w/o hematuria. Microcytic anemia likely due to iron deficiency but received blood in ED already. Will try to add-on to prior labs - F/u post-transfusion cbc - F/u iron panel - Hold anti-platelet - Re-check cbc in am  CAD Chronic diastolic heart failure Not in acute heart failure. Some lower extremity edema but receiving fluids for hypoglycemia and blood. Will need to monitor volume status carefully for TACO. - Monitor I/Os, volume status - C/w home meds  Hypoglycemia Insulin Dependent Diabetes Mellitus Admit cbg 43. On Tresiba 60 units qhs, Novolog 20 units qc. Took last dose yesterday night. Likely hypoglycemic due to lack of food intake while in ED. On D10W for hypoglycemia. Current cbg 129. - D/C D10W - Hold home insulin for now - Can restart tomorrow  BPH - C/w home meds: tamsulosin  GERD - C/w home meds: omeprazole  HLD - C/w home meds: atorvastatin  Anxiety - C/w home meds: citalopram  Dispo: Admit patient to Observation with expected length of stay less than 2 midnights.  Signed: Theotis Barrio, MD 05/18/2019, 5:21 PM  Pager: [phone]

## 2019-05-19 DIAGNOSIS — I2510 Atherosclerotic heart disease of native coronary artery without angina pectoris: Secondary | ICD-10-CM

## 2019-05-19 DIAGNOSIS — M1990 Unspecified osteoarthritis, unspecified site: Secondary | ICD-10-CM

## 2019-05-19 DIAGNOSIS — D500 Iron deficiency anemia secondary to blood loss (chronic): Secondary | ICD-10-CM

## 2019-05-19 DIAGNOSIS — N400 Enlarged prostate without lower urinary tract symptoms: Secondary | ICD-10-CM

## 2019-05-19 DIAGNOSIS — Z881 Allergy status to other antibiotic agents status: Secondary | ICD-10-CM

## 2019-05-19 DIAGNOSIS — I5032 Chronic diastolic (congestive) heart failure: Secondary | ICD-10-CM | POA: Diagnosis not present

## 2019-05-19 DIAGNOSIS — Z794 Long term (current) use of insulin: Secondary | ICD-10-CM

## 2019-05-19 DIAGNOSIS — E11649 Type 2 diabetes mellitus with hypoglycemia without coma: Secondary | ICD-10-CM

## 2019-05-19 DIAGNOSIS — Z9889 Other specified postprocedural states: Secondary | ICD-10-CM

## 2019-05-19 DIAGNOSIS — Z79899 Other long term (current) drug therapy: Secondary | ICD-10-CM

## 2019-05-19 DIAGNOSIS — Z86010 Personal history of colonic polyps: Secondary | ICD-10-CM

## 2019-05-19 DIAGNOSIS — Z880 Allergy status to penicillin: Secondary | ICD-10-CM

## 2019-05-19 DIAGNOSIS — Z888 Allergy status to other drugs, medicaments and biological substances status: Secondary | ICD-10-CM

## 2019-05-19 DIAGNOSIS — K219 Gastro-esophageal reflux disease without esophagitis: Secondary | ICD-10-CM

## 2019-05-19 LAB — TYPE AND SCREEN
ABO/RH(D): AB NEG
Antibody Screen: NEGATIVE
Unit division: 0
Unit division: 0

## 2019-05-19 LAB — BASIC METABOLIC PANEL
Anion gap: 8 (ref 5–15)
BUN: 11 mg/dL (ref 8–23)
CO2: 26 mmol/L (ref 22–32)
Calcium: 9.5 mg/dL (ref 8.9–10.3)
Chloride: 105 mmol/L (ref 98–111)
Creatinine, Ser: 0.98 mg/dL (ref 0.61–1.24)
GFR calc Af Amer: >60 mL/min (ref >60)
GFR calc non Af Amer: >60 mL/min (ref >60)
Glucose, Bld: 102 mg/dL — ABNORMAL HIGH (ref 70–99)
Potassium: 4.2 mmol/L (ref 3.5–5.1)
Sodium: 139 mmol/L (ref 135–145)

## 2019-05-19 LAB — GLUCOSE, CAPILLARY
Glucose-Capillary: 107 mg/dL — ABNORMAL HIGH (ref 70–99)
Glucose-Capillary: 163 mg/dL — ABNORMAL HIGH (ref 70–99)
Glucose-Capillary: 212 mg/dL — ABNORMAL HIGH (ref 70–99)
Glucose-Capillary: 46 mg/dL — ABNORMAL LOW (ref 70–99)
Glucose-Capillary: 63 mg/dL — ABNORMAL LOW (ref 70–99)
Glucose-Capillary: 84 mg/dL (ref 70–99)

## 2019-05-19 LAB — CBC
HCT: 29.1 % — ABNORMAL LOW (ref 39.0–52.0)
HCT: 30.7 % — ABNORMAL LOW (ref 39.0–52.0)
Hemoglobin: 7.6 g/dL — ABNORMAL LOW (ref 13.0–17.0)
Hemoglobin: 8.2 g/dL — ABNORMAL LOW (ref 13.0–17.0)
MCH: 18.9 pg — ABNORMAL LOW (ref 26.0–34.0)
MCH: 19.2 pg — ABNORMAL LOW (ref 26.0–34.0)
MCHC: 26.1 g/dL — ABNORMAL LOW (ref 30.0–36.0)
MCHC: 26.7 g/dL — ABNORMAL LOW (ref 30.0–36.0)
MCV: 71.7 fL — ABNORMAL LOW (ref 80.0–100.0)
MCV: 72.2 fL — ABNORMAL LOW (ref 80.0–100.0)
Platelets: 436 10*3/uL — ABNORMAL HIGH (ref 150–400)
Platelets: 449 10*3/uL — ABNORMAL HIGH (ref 150–400)
RBC: 4.03 MIL/uL — ABNORMAL LOW (ref 4.22–5.81)
RBC: 4.28 MIL/uL (ref 4.22–5.81)
RDW: 22.1 % — ABNORMAL HIGH (ref 11.5–15.5)
RDW: 22.5 % — ABNORMAL HIGH (ref 11.5–15.5)
WBC: 10.5 10*3/uL (ref 4.0–10.5)
WBC: 10.5 10*3/uL (ref 4.0–10.5)
nRBC: 0 % (ref 0.0–0.2)
nRBC: 0 % (ref 0.0–0.2)

## 2019-05-19 LAB — HEMOGLOBIN A1C
Hgb A1c MFr Bld: 6.8 % — ABNORMAL HIGH (ref 4.8–5.6)
Mean Plasma Glucose: 148.46 mg/dL

## 2019-05-19 LAB — BPAM RBC
Blood Product Expiration Date: 202104212359
Blood Product Expiration Date: 202104302359
ISSUE DATE / TIME: 202104141306
ISSUE DATE / TIME: 202104141309
Unit Type and Rh: 600
Unit Type and Rh: 600

## 2019-05-19 LAB — HIV ANTIBODY (ROUTINE TESTING W REFLEX): HIV Screen 4th Generation wRfx: NONREACTIVE

## 2019-05-19 MED ORDER — POLYETHYLENE GLYCOL 3350 17 G PO PACK: 17.0000 g | PACK | Freq: Every day | ORAL | 0 refills | Status: DC | PRN

## 2019-05-19 MED ORDER — POTASSIUM CHLORIDE CRYS ER 20 MEQ PO TBCR: 20.0000 meq | EXTENDED_RELEASE_TABLET | Freq: Every day | ORAL | Status: DC

## 2019-05-19 MED ORDER — GLUCOSE 40 % PO GEL
ORAL | Status: AC
  Filled 2019-05-19: qty 1

## 2019-05-19 MED ORDER — AMLODIPINE BESYLATE 5 MG PO TABS
5.0000 mg | ORAL_TABLET | Freq: Every day | ORAL | Status: DC
  Administered 2019-05-19: 5 mg via ORAL
  Filled 2019-05-19: qty 1

## 2019-05-19 MED ORDER — POLYETHYLENE GLYCOL 3350 17 G PO PACK
17.0000 g | PACK | Freq: Every day | ORAL | Status: DC
  Administered 2019-05-19: 17 g via ORAL
  Filled 2019-05-19: qty 1

## 2019-05-19 MED ORDER — SODIUM CHLORIDE 0.9 % IV SOLN
510.0000 mg | Freq: Once | INTRAVENOUS | Status: AC
  Administered 2019-05-19: 510 mg via INTRAVENOUS
  Filled 2019-05-19: qty 17

## 2019-05-19 MED ORDER — FUROSEMIDE 20 MG PO TABS
20.0000 mg | ORAL_TABLET | Freq: Every day | ORAL | Status: DC
  Administered 2019-05-19: 20 mg via ORAL
  Filled 2019-05-19: qty 1

## 2019-05-19 MED ORDER — FERROUS SULFATE 325 (65 FE) MG PO TABS: 325.0000 mg | ORAL_TABLET | ORAL | 0 refills | Status: DC

## 2019-05-19 NOTE — Telephone Encounter (Signed)
Hi [name] Williams has anemia with heme neg stools. Looks like he is going to be discharged today or early tomorrow morning. Please make sure we see him in 2 weeks. Certainly can be seen in my clinic or with [facility] He would need GI work-up as an outpatient.  RG

## 2019-05-19 NOTE — Progress Notes (Signed)
Date: 05/19/2019  Patient name: [name] Williams  Medical record number: [id number]  Date of birth: 1950-05-10   I have seen and evaluated [name] Williams and discussed their care with the Residency Team.  In brief, patient is a 69 year old male with past medical history of CAD, BPH, OA, GERD and type 2 diabetes who presented to the ED with fatigue.  Patient has complained of increased fatigue and weakness over the last couple weeks and was scheduled to have cardiac catheterization for which he went to do preprocedure lab work.  On routine lab work he was noted to be anemic and his sent to the ED for further evaluation.  Patient does complain of bright red blood in his stool 2 days ago after hard BM but this has happened for-itis hemorrhoids.  Patient had colonoscopy approximately 3 years ago with removal of a few polyps.  No chest pain, no shortness of breath, no palpitations, no lightheadedness, no syncope, no focal weakness, no tingling or numbness, no tingling or numbness, no fevers or chills, no nausea or vomiting, no abdominal pain, no diarrhea.  Of note, patient did have episode of hypoglycemia in the ED likely secondary to taking his insulin and not eating.  Today, patient states that he had a little blood around his bowel movement this morning.  PMHx, Fam Hx, and/or Soc Hx : As per resident admit note  Vitals:   05/19/19 0917 05/19/19 1140  BP: (!) 152/78 (!) 145/65  Pulse: 74 73  Resp:  18  Temp: 98.5 F (36.9 C) 98.2 F (36.8 C)  SpO2:  97%   General: Awake, alert, oriented x3, NAD CVS: Regular rate and rhythm, normal heart sounds Lungs: CTA bilaterally Abdomen: Soft, nontender, nondistended, normoactive bowel sounds Extremities: Trace bilateral lower extremity edema noted, nontender to palpation HEENT: Normocephalic, atraumatic Psych: Normal mood and affect Skin: Warm and dry   Assessment and Plan: I have seen and evaluated the patient as outlined above. I agree with the  formulated Assessment and Plan as detailed in the residents' note, with the following changes:   1.  Fatigue likely secondary to chronic blood loss anemia: -Patient presented to the ED after being found to be anemic on outpatient blood work.  Patient hemoglobin 6.8 on admission with iron studies consistent with iron deficiency anemia.  I suspect that the patient has chronic blood loss anemia as he has not had any episodes of major GI bleeding recently even though he did note a small quantity of blood on the toilet tissue as well as in his bowel movement. -Patient's hemoglobin improved to 7.6 from 6.8.  Will recheck CBC at noon today. -Patient also noted to be iron deficient and received Feraheme on this admission -Patient will need outpatient follow-up with GI for repeat colonoscopy and possible EGD given likely chronic blood loss anemia -No further work-up at this time -Patient should be stable for DC home today if his hemoglobin remains stable  Earl Lagos, MD 4/15/202112:24 PM

## 2019-05-19 NOTE — Plan of Care (Signed)
  Problem: Education: Goal: Knowledge of General Education information will improve Description: Including pain rating scale, medication(s)/side effects and non-pharmacologic comfort measures Outcome: Adequate for Discharge   Problem: Health Behavior/Discharge Planning: Goal: Ability to manage health-related needs will improve Outcome: Adequate for Discharge   Problem: Clinical Measurements: Goal: Ability to maintain clinical measurements within normal limits will improve Outcome: Adequate for Discharge Goal: Will remain free from infection Outcome: Adequate for Discharge Goal: Diagnostic test results will improve Outcome: Adequate for Discharge   Problem: Nutrition: Goal: Adequate nutrition will be maintained Outcome: Adequate for Discharge   Problem: Safety: Goal: Ability to remain free from injury will improve Outcome: Adequate for Discharge   Problem: Skin Integrity: Goal: Risk for impaired skin integrity will decrease Outcome: Adequate for Discharge

## 2019-05-19 NOTE — Progress Notes (Addendum)
Pt's bs 46; presently asymptomatic. Pt states "I feel fine". 8oz OJ given. BS to be checked momentarily.  BS 63. Treatment continued. Monitoring continues. MD @ [phone] paged to give situation  BS 107

## 2019-05-19 NOTE — Progress Notes (Signed)
Occupational Therapy Evaluation  PTA, pt lived alone and was independent. Pt states he feels "woozy" when standing. Orthostatics taken and documented in doc flow under vitals. Pt demonstrates a 10 point drop in systolic pressure from sit - stand, however BP increased appropriately after activity. Pt plans to stay with sister initially after DC. Recommend use of shower chair due to pt not feeling "100%" - sister has one he can borrow. Educated on strateiges to reduce risk of falls. OT signing off.     05/19/19 1300  OT Visit Information  Last OT Received On 05/19/19  Assistance Needed +1  History of Present Illness Patient is a 69 year old male with past medical history significant for coronary artery disease, BPH, OSA, GERD, and type 2 diabetes mellitus who presented on 05/18/2019 from outpatient provider after hemoglobin of 6.8 on preoperative screening.  Pt has received 2 units PRBC and iron with current hgb of 7.6.  He had some issues with hypoglycemia last now but latest glucose was 163.  Precautions  Precaution Comments complains of feeling "woozy"  Home Living  Family/patient expects to be discharged to: Private residence  Living Arrangements Alone  Available Help at Discharge Family;Available PRN/intermittently  Type of Home House  Home Access Stairs to enter  Entrance Stairs-Number of Steps 7  Entrance Stairs-Rails Right;Left;Can reach both  Home Layout One level  Bathroom Biomedical scientist Yes  How Accessible Accessible via walker  [place] - single point  Additional Comments plans to stay with sister; she has shower seat he can use  Prior Function  Level of Independence Independent  Communication  Communication No difficulties  Pain Assessment  Pain Assessment No/denies pain  Cognition  Arousal/Alertness Awake/alert  Behavior During Therapy WFL for tasks assessed/performed  Overall Cognitive Status No  family/caregiver present to determine baseline cognitive functioning  Upper Extremity Assessment  Upper Extremity Assessment Overall WFL for tasks assessed  Lower Extremity Assessment  Lower Extremity Assessment Defer to PT evaluation  Cervical / Trunk Assessment  Cervical / Trunk Assessment Normal  ADL  Overall ADL's  At baseline  General ADL Comments Pt states he feels "woozy" when standing. Recommend pt use a shower chair after DC and have S initially with showers. Pt/sister verbalized understanding.   Bed Mobility  Overal bed mobility Independent  Transfers  Overall transfer level Independent  Balance  Overall balance assessment Independent  OT - End of Session  Equipment Utilized During Treatment Gait belt  Activity Tolerance Patient tolerated treatment well  Patient left in bed;with call bell/phone within reach;with family/visitor present  Nurse Communication Mobility status  OT Assessment  OT Recommendation/Assessment Patient does not need any further OT services  OT Visit Diagnosis Dizziness and giddiness (R42)  OT Problem List Decreased activity tolerance;Obesity  AM-PAC OT "6 Clicks" Daily Activity Outcome Measure (Version 2)  Help from another person eating meals? 4  Help from another person taking care of personal grooming? 4  Help from another person toileting, which includes using toliet, bedpan, or urinal? 4  Help from another person bathing (including washing, rinsing, drying)? 4  Help from another person to put on and taking off regular upper body clothing? 4  Help from another person to put on and taking off regular lower body clothing? 4  6 Click Score 24  OT Recommendation  Follow Up Recommendations No OT follow up;Supervision - Intermittent  OT Equipment None recommended by OT  Acute Rehab OT Goals  Patient Stated Goal return home  OT Goal Formulation All assessment and education complete, DC therapy  OT Time Calculation  OT Start Time (ACUTE ONLY) 1155   OT Stop Time (ACUTE ONLY) 1215  OT Time Calculation (min) 20 min  OT General Charges  $OT Visit 1 Visit  OT Evaluation  $OT Eval Low Complexity 1 Low  Written Expression  Dominant Hand Right  Maurie Boettcher, OT/L   Acute OT Clinical Specialist Acute Rehabilitation Services Pager [phone] Office [phone]

## 2019-05-19 NOTE — Progress Notes (Signed)
  Subjective:  Mr. Buhl was examined and evaluated at bedside this AM. He states his symptoms of fatigue and weakness has improved compared to yesterday. He mentions that he had subjective chills overnight likely due to hypoglycemia. He also mentions he noted some blood around his bowel movement this morning and mentions some abdominal discomfort. Discussed plan to re-check his blood count today and see if he would be safe for discharge.  Objective:   Vital Signs (last 24 hours): Vitals:   05/18/19 1652 05/18/19 1839 05/19/19 0004 05/19/19 0537  BP: (!) 149/68 (!) 160/71 (!) 164/75 (!) 151/80  Pulse: 64 66 67 70  Resp: 16 18 18 18   Temp: 98.5 F (36.9 C) 98.1 F (36.7 C) 97.9 F (36.6 C) 98.1 F (36.7 C)  TempSrc: Oral Oral Oral Oral  SpO2: 99% 100% 100% 94%  Weight:      Height:       Physical Exam: General Alert and answers questions appropriately, no acute distress  Cardiac Regular rate and rhythm, no murmurs, rubs, or gallops  Pulmonary Clear to auscultation bilaterally without wheezes, rhonchi, or rales  Extremities Trace bilateral lower extremity edema  Abdominal Soft, non-tender, without distention   CBC Latest Ref Rng & Units 05/18/2019 05/18/2019 05/18/2019  WBC 4.0 - 10.5 K/uL 10.5 11.0(H) 11.5(H)  Hemoglobin 13.0 - 17.0 g/dL 7.6(L) 7.9(L) 6.6(LL)  Hematocrit 39.0 - 52.0 % 29.1(L) 29.2(L) 26.4(L)  Platelets 150 - 400 K/uL 436(H) 429(H) 452(H)   BMP Latest Ref Rng & Units 05/18/2019 05/18/2019 05/17/2019  Glucose 70 - 99 mg/dL 102(H) 43(LL) 165(H)  BUN 8 - 23 mg/dL 11 12 16   Creatinine 0.61 - 1.24 mg/dL 0.98 0.80 0.93  BUN/Creat Ratio 10 - 24 - - 17  Sodium 135 - 145 mmol/L 139 139 139  Potassium 3.5 - 5.1 mmol/L 4.2 4.0 4.9  Chloride 98 - 111 mmol/L 105 107 102  CO2 22 - 32 mmol/L 26 23 23   Calcium 8.9 - 10.3 mg/dL 9.5 9.3 9.6   Iron: 63 TIBC: 659 Saturation Ratios: 10 UIBC: 596 Ferritin: 4  I/Os: No output recorded  Assessment/Plan:   Active Problems:  Anemia  Patient is a 69 year old male with past medical history significant for coronary artery disease, BPH, OSA, GERD, and type 2 diabetes mellitus who presented on 05/18/2019 from outpatient provider after hemoglobin of 6.8 on preoperative screening.  # Symptomatic iron deficiency anemia: Hemoglobin 6.8 on admission, MCV 72.3. No obvious signs of bleeding, FOBT negative, no blood on rectal exam. UA without hematuria. Iron studies reveal normal serum iron of 63 but markedly low ferritin at 4, elevated TIBC and low saturation ratios consistent with iron deficiency anemia. *Will recheck CBC at noon, discharge if stable *IV Feraheme  # CAD # Chronic diastolic heart failure No evidence for acute heart failure exacerbation. Mild lower extremity pitting edema s/p fluid administration and blood. On evaluation this AM, patient with only trace bilateral edema. *Restart home lasix 20 mg daily *Monitor I/Os, volume status   # Hypoglycemia # Insulin dependent diabetes mellitus Admission CBG of 43, on tresiba 60 units daily and novolog 20 units three times daily with meals. Last CBG this AM of 163. Will continue to hold long-acting for now, continue with SSI  PT/OT: Consulted for imminent discharge Diet: Heart healthy/carb modified DVT Ppx: SCDs Admit Status: Observation Dispo: Anticipated discharge today, pending repeat CBC  Jeanmarie Hubert, MD 05/19/2019, 7:17 AM

## 2019-05-19 NOTE — Progress Notes (Signed)
Results for ASHETON, LODERMEIER (MRN [id number]) as of 05/19/2019 11:14  Ref. Range 05/19/2019 04:10 05/19/2019 04:29 05/19/2019 04:48 05/19/2019 07:21  Glucose-Capillary Latest Ref Range: 70 - 99 mg/dL 46 (L) 63 (L) 107 (H) 163 (H)  Noted that blood sugars continue to be less than 100 mg/dl, especially after getting Novolog.  Recommend changing Novolog correction scale to SENSITIVE TID if patient is eating.   Will continue to monitor blood sugars while in the hospital.  Harvel Ricks RN BSN CDE Diabetes Coordinator Pager: [phone]  8am-5pm

## 2019-05-19 NOTE — Evaluation (Signed)
Physical Therapy Evaluation Patient Details Name: [name] Williams MRN: [id number] DOB: 1950/11/21 Today's Date: 05/19/2019   History of Present Illness  Patient is a 69 year old male with past medical history significant for coronary artery disease, BPH, OSA, GERD, and type 2 diabetes mellitus who presented on 05/18/2019 from outpatient provider after hemoglobin of 6.8 on preoperative screening.  Pt has received 2 units PRBC and iron with current hgb of 7.6.  He had some issues with hypoglycemia last now but latest glucose was 163.  Clinical Impression  Pt admitted with above diagnosis. He was able to transfer, ambulate, and perform stairs at near baseline level and safely.  Pt with a DGI score of 23 indicating low fall risk.  Pt did have slight decrease in BP with standing but asymptomatic and improved after 3 mins.  No further PT indicated.  Pt reports plans to stay with his sister for a couple of days.    Follow Up Recommendations No PT follow up    Equipment Recommendations  None recommended by PT    Recommendations for Other Services       Precautions / Restrictions Precautions Precautions: None      Mobility  Bed Mobility Overal bed mobility: Independent             General bed mobility comments: Pt has been getting up without difficulty and ambulating to bathroom and performing ADLs  Transfers Overall transfer level: Independent   Transfers: Sit to/from Stand Sit to Stand: Independent            Ambulation/Gait Ambulation/Gait assistance: Independent Gait Distance (Feet): 500 Feet Assistive device: None Gait Pattern/deviations: WFL(Within Functional Limits) Gait velocity: decreased   General Gait Details: normal; ambulated without difficulty  Stairs Stairs: Yes Stairs assistance: Supervision Stair Management: One rail Right;Alternating pattern Number of Stairs: 7    Wheelchair Mobility    Modified Rankin (Stroke Patients Only)       Balance  Overall balance assessment: Independent                               Standardized Balance Assessment Standardized Balance Assessment : Dynamic Gait Index   Dynamic Gait Index Level Surface: Normal Change in Gait Speed: Normal Gait with Horizontal Head Turns: Normal Gait with Vertical Head Turns: Normal Gait and Pivot Turn: Normal Step Over Obstacle: Normal Step Around Obstacles: Normal Steps: Mild Impairment Total Score: 23       Pertinent Vitals/Pain Pain Assessment: 0-10 Pain Location: Reports stomach feels weird but no pain    Home Living Family/patient expects to be discharged to:: Private residence Living Arrangements: Alone(could stay with sister if needed) Available Help at Discharge: Family;Available PRN/intermittently Type of Home: House Home Access: Stairs to enter Entrance Stairs-Rails: Right;Left;Can reach both Entrance Stairs-Number of Steps: 7 Home Layout: One level Home Equipment: Cane - single point      Prior Function           Comments: Reports he was independent with ADLs and IADLs, drives, and could ambulate in community     Hand Dominance        Extremity/Trunk Assessment   Upper Extremity Assessment Upper Extremity Assessment: Overall WFL for tasks assessed    Lower Extremity Assessment Lower Extremity Assessment: Overall WFL for tasks assessed    Cervical / Trunk Assessment Cervical / Trunk Assessment: Normal  Communication   Communication: No difficulties  Cognition Arousal/Alertness: Awake/alert Behavior During Therapy: [facility]  for tasks assessed/performed Overall Cognitive Status: No family/caregiver present to determine baseline cognitive functioning                                 General Comments: increased time to respond with repeated questions at times      General Comments General comments (skin integrity, edema, etc.):  BPs as follows: Sitting 157/87 and HR 78, standing 139/70 and HR 75,  standing 3 mins 142/70 and HR 81  Due to admitted with anemia and slight drop in bp with standing, educated on safety with transfers if dizzy.  Discussed sitting at EOB with ROM prior to standing, and then standing and weight shifting a few mins before walking to make sure not dizzy. Pt understands.     Exercises     Assessment/Plan    PT Assessment Patent does not need any further PT services  PT Problem List         PT Treatment Interventions      PT Goals (Current goals can be found in the Care Plan section)  Acute Rehab PT Goals Patient Stated Goal: return home PT Goal Formulation: All assessment and education complete, DC therapy Time For Goal Achievement: 05/19/19    Frequency     Barriers to discharge        Co-evaluation               AM-PAC PT "6 Clicks" Mobility  Outcome Measure Help needed turning from your back to your side while in a flat bed without using bedrails?: None Help needed moving from lying on your back to sitting on the side of a flat bed without using bedrails?: None Help needed moving to and from a bed to a chair (including a wheelchair)?: None Help needed standing up from a chair using your arms (e.g., wheelchair or bedside chair)?: None Help needed to walk in hospital room?: None Help needed climbing 3-5 steps with a railing? : None 6 Click Score: 24    End of Session Equipment Utilized During Treatment: Gait belt Activity Tolerance: Patient tolerated treatment well Patient left: in bed;with call bell/phone within reach Nurse Communication: Mobility status      Time: 1006-1036 PT Time Calculation (min) (ACUTE ONLY): 30 min   Charges:   PT Evaluation $PT Eval Moderate Complexity: 1 Mod          Royetta Asal, PT Acute Rehab Services Pager [phone] [facility] [phone] [facility] [phone]   Rayetta Humphrey 05/19/2019, 10:56 AM

## 2019-05-19 NOTE — Discharge Summary (Signed)
Name: [name] Williams MRN: [id number] DOB: [birth year] 69 y.o. PCP: [name] Fusi, MD  Date of Admission: 05/18/2019 11:30 AM Date of Discharge: 05/19/2019 Attending Physician: Dr. Heide Williams  Discharge Diagnosis: 1. Iron deficiency anemia 2. Insulin dependent type 2 diabetes mellitus 3. Hypoglycemia  Discharge Medications: Allergies as of 05/19/2019      Reactions   Pseudoephedrine Other (See Comments)   Other Reaction: Not Assessed Other Reaction: Not Assessed   Penicillins Rash   Tetracyclines & Related Rash      Medication List    TAKE these medications   ADULT GUMMY PO Take 2 tablets by mouth daily.   amLODipine 5 MG tablet Commonly known as: NORVASC Take 5 mg by mouth daily.   aspirin EC 81 MG tablet Take 1 tablet (81 mg total) by mouth daily.   atorvastatin 80 MG tablet Commonly known as: LIPITOR Take 80 mg by mouth at bedtime.   cetirizine 10 MG tablet Commonly known as: ZYRTEC Take 10 mg by mouth at bedtime.   citalopram 20 MG tablet Commonly known as: CELEXA Take 20 mg by mouth daily.   ferrous sulfate 325 (65 FE) MG tablet Take 1 tablet (325 mg total) by mouth every other day.   furosemide 20 MG tablet Commonly known as: LASIX Take 1 tablet (20 mg total) by mouth daily.   gemfibrozil 600 MG tablet Commonly known as: LOPID Take 600 mg by mouth 2 (two) times daily before a meal.   glimepiride 4 MG tablet Commonly known as: AMARYL Take 4 mg by mouth 2 (two) times daily.   losartan 100 MG tablet Commonly known as: COZAAR Take 100 mg by mouth at bedtime.   meloxicam 7.5 MG tablet Commonly known as: MOBIC Take 7.5 mg by mouth 2 (two) times daily.   metFORMIN 1000 MG tablet Commonly known as: GLUCOPHAGE Take 1,000 mg by mouth 2 (two) times daily with a meal.   metoprolol succinate 25 MG 24 hr tablet Commonly known as: Toprol XL Take 0.5 tablets (12.5 mg total) by mouth daily.   montelukast 10 MG tablet Commonly known as:  SINGULAIR Take 10 mg by mouth daily.   naphazoline-pheniramine 0.025-0.3 % ophthalmic solution Commonly known as: NAPHCON-A Place 1 drop into both eyes 3 (three) times daily as needed for eye irritation (dry/irritated/allergy eyes.).   nitroGLYCERIN 0.4 MG SL tablet Commonly known as: NITROSTAT Place 1 tablet (0.4 mg total) under the tongue every 5 (five) minutes as needed for chest pain.   NovoLOG FlexPen 100 UNIT/ML FlexPen Generic drug: insulin aspart Inject 20 Units into the skin in the morning, at noon, and at bedtime.   omeprazole 40 MG capsule Commonly known as: PRILOSEC Take 40 mg by mouth every evening.   polyethylene glycol 17 g packet Commonly known as: MiraLax Take 17 g by mouth daily as needed for mild constipation, moderate constipation or severe constipation.   potassium chloride SA 20 MEQ tablet Commonly known as: KLOR-CON Take 1 tablet (20 mEq total) by mouth daily.   tamsulosin 0.4 MG Caps capsule Commonly known as: FLOMAX Take 0.4 mg by mouth daily.   Evaristo Bury FlexTouch 100 UNIT/ML FlexTouch Pen Generic drug: insulin degludec Inject 60 Units into the skin at bedtime.       Disposition and follow-up:   [name] Williams was discharged from [facility] in Stable condition.  At the hospital follow up visit please address:  1.  Please check CBC to assess for worsening of anemia. Patient  anemia is likely chronic and patient may benefit from repeat colonoscopy. Please ensure patient has follwed up with gastroenterology. Please ensure patient has continued on iron supplementation.  2.  Labs / imaging needed at time of follow-up: CBC  3.  Pending labs/ test needing follow-up: None  Follow-up Appointments: Follow-up Information    [name] Fusi, MD Follow up.   Specialty: Internal Medicine Why: Please call for a followup appointment to occur in approximately 1 week Contact information: [address] [place]  [address] [phone]        [name] Ripple, DO .   Specialty: Cardiology Contact information: [address] [place] [address] [phone]        [name] Bologna, MD Follow up.   Specialties: Gastroenterology, Internal Medicine Why: Please call for a followup appointment to occur in the next week. Please let him know that you were recently hospitalized for low hemoglobin Contact information: [address] [place] [address] [phone]           Hospital Course by problem list:  # Symptomatic iron deficiency anemia:  Patient is a 69 year old male with past medical history significant for coronary artery disease, BPH, OSA, GERD, and type 2 diabetes mellitus who presented on 05/18/2019 from outpatient provider after hemoglobin of 6.8 (on 05/17/19) found on preoperative screening. Iron studies revealed low ferritin of 4, normal serum iron, elevated TIBC and low saturation ratios consistent with iron deficiency anemia. On presentation, patient was hemodynamically stable, no signs of active bleeding, FOBT negative, urinalysis without RBCs. Patient was given 2 units PRBCs with hemoglobin stable on subsequent draws, hemoglobin of 8.2 on day of discharge. Patient was given Feraheme transfusion x1 while inpatient and discharged with prescription for oral iron supplementation.  Patient was discharged with instructions to follow-up with outpatient gastroenterologist for consideration of repeat colonoscopy.  # Hypoglycemia # Insulin dependent diabetes mellitus Patient with symptomatic hypoglycemia in presentation with CBG of 43. Patient reported taking long-acting insulin evening prior to presentation and having significantly decreased PO intake secondary to travel to ER. Patient was given D10 to normalize blood glucose, and glucose improved with initiation of diet.  Patient was discharged on home regimen.  Discharge Vitals:   BP (!) 145/65 (BP Location: Right Arm)    Pulse 73   Temp 98.2 F (36.8 C) (Oral)   Resp 18   Ht 5\' 10"  (1.778 m)   Wt 123.8 kg   SpO2 97%   BMI 39.17 kg/m   Pertinent Labs, Studies, and Procedures:  CBC Latest Ref Rng & Units 05/19/2019 05/18/2019 05/18/2019  WBC 4.0 - 10.5 K/uL 10.5 10.5 11.0(H)  Hemoglobin 13.0 - 17.0 g/dL 8.2(L) 7.6(L) 7.9(L)  Hematocrit 39.0 - 52.0 % 30.7(L) 29.1(L) 29.2(L)  Platelets 150 - 400 K/uL 449(H) 436(H) 429(H)   BMP Latest Ref Rng & Units 05/18/2019 05/18/2019 05/17/2019  Glucose 70 - 99 mg/dL 440(H) 47(QQ) 595(G)  BUN 8 - 23 mg/dL 11 12 16   Creatinine 0.61 - 1.24 mg/dL 3.87 5.64 3.32  BUN/Creat Ratio 10 - 24 - - 17  Sodium 135 - 145 mmol/L 139 139 139  Potassium 3.5 - 5.1 mmol/L 4.2 4.0 4.9  Chloride 98 - 111 mmol/L 105 107 102  CO2 22 - 32 mmol/L 26 23 23   Calcium 8.9 - 10.3 mg/dL 9.5 9.3 9.6   Hemoglobin A1C (05/18/19): 6.8  Iron Studies (05/18/19):  Iron: 63 TIBC: 659 Saturation Ratios: 10% UIBC: 596 Ferritin: 4  Discharge Instructions: Discharge Instructions    Call MD for:  difficulty breathing, headache or visual disturbances   Complete by: As directed    Call MD for:  extreme fatigue   Complete by: As directed    Call MD for:  persistant dizziness or light-headedness   Complete by: As directed    Diet - low sodium heart healthy   Complete by: As directed    Discharge instructions   Complete by: As directed    You were seen in the hospital for low blood counts. You were given a blood transfusion and your blood levels have increased and remained stable. Your bleeding is likely coming from your bowels and you would benefit from a repeat colonoscopy. Please followup with Dr. Chales Abrahams for evaluation. Please also call Dr. Tomasa Blase office to have your blood counts rechecked in about 1 week. Please call your doctor or return to the emergency room if you have repeat episodes of bleeding or develop chest pain, difficulty breathing, or severe fatigue.  You have been provided with  prescription for iron which can help improve your blood counts. You have also been provided prescription for miralax which you can take as needed for constipation.  Thank you for allowing [place] to be part of your medical care!   Increase activity slowly   Complete by: As directed       Signed: Katherine Roan, MD 05/20/2019, 11:05 AM

## 2019-05-20 ENCOUNTER — Ambulatory Visit (HOSPITAL_COMMUNITY): Admission: RE | Admit: 2019-05-20 | Payer: Medicare PPO | Source: Home / Self Care | Admitting: Cardiovascular Disease

## 2019-05-20 ENCOUNTER — Encounter (HOSPITAL_COMMUNITY): Admission: RE | Payer: Self-pay | Source: Home / Self Care

## 2019-05-20 SURGERY — LEFT HEART CATH AND CORONARY ANGIOGRAPHY
Anesthesia: LOCAL

## 2019-05-23 NOTE — Telephone Encounter (Signed)
Patient is scheduled for 06/07/19 at 2:30pm.

## 2019-05-24 DIAGNOSIS — D509 Iron deficiency anemia, unspecified: Secondary | ICD-10-CM | POA: Diagnosis not present

## 2019-05-24 DIAGNOSIS — K625 Hemorrhage of anus and rectum: Secondary | ICD-10-CM | POA: Diagnosis not present

## 2019-05-24 DIAGNOSIS — R1013 Epigastric pain: Secondary | ICD-10-CM | POA: Diagnosis not present

## 2019-05-24 DIAGNOSIS — R110 Nausea: Secondary | ICD-10-CM | POA: Diagnosis not present

## 2019-05-31 ENCOUNTER — Encounter: Payer: Self-pay | Admitting: Nurse Practitioner

## 2019-05-31 ENCOUNTER — Other Ambulatory Visit (INDEPENDENT_AMBULATORY_CARE_PROVIDER_SITE_OTHER): Payer: Medicare PPO

## 2019-05-31 ENCOUNTER — Other Ambulatory Visit: Payer: Self-pay

## 2019-05-31 ENCOUNTER — Ambulatory Visit (INDEPENDENT_AMBULATORY_CARE_PROVIDER_SITE_OTHER): Payer: Medicare PPO | Admitting: Nurse Practitioner

## 2019-05-31 VITALS — BP 100/60 | HR 76 | Temp 98.7°F | Ht 70.0 in | Wt 269.5 lb

## 2019-05-31 DIAGNOSIS — K625 Hemorrhage of anus and rectum: Secondary | ICD-10-CM

## 2019-05-31 DIAGNOSIS — D509 Iron deficiency anemia, unspecified: Secondary | ICD-10-CM

## 2019-05-31 LAB — CBC WITH DIFFERENTIAL/PLATELET
Basophils Absolute: 0.1 10*3/uL (ref 0.0–0.1)
Basophils Relative: 1.2 % (ref 0.0–3.0)
Eosinophils Absolute: 0.5 10*3/uL (ref 0.0–0.7)
Eosinophils Relative: 5.4 % — ABNORMAL HIGH (ref 0.0–5.0)
HCT: 32.8 % — ABNORMAL LOW (ref 39.0–52.0)
Hemoglobin: 10.1 g/dL — ABNORMAL LOW (ref 13.0–17.0)
Lymphocytes Relative: 26.7 % (ref 12.0–46.0)
Lymphs Abs: 2.5 10*3/uL (ref 0.7–4.0)
MCHC: 30.7 g/dL (ref 30.0–36.0)
MCV: 75.5 fl — ABNORMAL LOW (ref 78.0–100.0)
Monocytes Absolute: 0.8 10*3/uL (ref 0.1–1.0)
Monocytes Relative: 8.4 % (ref 3.0–12.0)
Neutro Abs: 5.5 10*3/uL (ref 1.4–7.7)
Neutrophils Relative %: 58.3 % (ref 43.0–77.0)
Platelets: 369 10*3/uL (ref 150.0–400.0)
RBC: 4.34 Mil/uL (ref 4.22–5.81)
RDW: 33.5 % — ABNORMAL HIGH (ref 11.5–15.5)
WBC: 9.4 10*3/uL (ref 4.0–10.5)

## 2019-05-31 LAB — BASIC METABOLIC PANEL
BUN: 19 mg/dL (ref 6–23)
CO2: 30 mEq/L (ref 19–32)
Calcium: 9.8 mg/dL (ref 8.4–10.5)
Chloride: 97 mEq/L (ref 96–112)
Creatinine, Ser: 0.91 mg/dL (ref 0.40–1.50)
GFR: 82.33 mL/min (ref >60.00)
Glucose, Bld: 168 mg/dL — ABNORMAL HIGH (ref 70–99)
Potassium: 4.5 mEq/L (ref 3.5–5.1)
Sodium: 134 mEq/L — ABNORMAL LOW (ref 135–145)

## 2019-05-31 LAB — IBC + FERRITIN
Ferritin: 49.9 ng/mL (ref 22.0–322.0)
Iron: 63 ug/dL (ref 42–165)
Saturation Ratios: 10.6 % — ABNORMAL LOW (ref 20.0–50.0)
Transferrin: 426 mg/dL — ABNORMAL HIGH (ref 212.0–360.0)

## 2019-05-31 MED ORDER — SUPREP BOWEL PREP KIT 17.5-3.13-1.6 GM/177ML PO SOLN
1.0000 | ORAL | 0 refills | Status: DC
Start: 2019-05-31 — End: 2019-07-08

## 2019-05-31 MED ORDER — HYDROCORTISONE ACETATE 25 MG RE SUPP
25.0000 mg | Freq: Every day | RECTAL | 0 refills | Status: DC
Start: 2019-05-31 — End: 2019-07-08

## 2019-05-31 NOTE — Progress Notes (Signed)
05/31/2019 [name] Williams [id number] [birth year]   CHIEF COMPLAINT:  Rectal bleeding   HISTORY OF PRESENT ILLNESS: [name] Williams with a past medical history of anxiety, depression, hypertension, hyperlipidemia, DM II, gout, GERD, colon polyps and IDA. He presents today accompanied by his sister, [name] Williams, for further evaluation for anemia. He is a challenging historian.   He was seen in office by cardiologist Dr. Thomasene Ripple on 05/17/2019 due to having chest pain and SOB.  A  pharmacologic stress test that was reportedly normal with no evidence of ischemia. A coronary CTA was done 4/6 which showed moderate coronary artery disease. A FFR CT was abnormal with flow-limiting lesions in the LAD and left circumflex arteries. He was started on ASA 81mg  daily and Atorvastatin 80mg  daily. A cardiac catheterization was scheduled for 05/20/2018 which was not completed as he was diagnosed with profound anemia. Labs done by his PCP 05/17/2019 showed a Hg 6.8. He continued to have chest tightness, SOB and he had lower abdominal burning pain. He was sent directly to [facility] ED on 05/18/2019. Labs in the ED showed Hg 6.6 , WBC 11.5 and Glucose of 43. He received 2 units of PRBCs. His Hg level fluctuated post transfusion  7.9 -> 7.6 -> to 8.2.  He was discharged home on 4/152021. He saw his PCP Dr. Tomasa Blase on 05/31/2019 and laboratory studies done showed Hg 10.1. HCT 32.8. MCV 75.5. PLT 369. Iron 63. Ferritin 49.9. Transferrin 426.   Currently, he denies having any dysphagia or heartburn. He takes Pantoprazole 40mg  daily. No upper abdominal pain. He describes having a weird lower abdominal sensation he cannot describe well, no severe pain. He is passing a normal brown solid stool daily, often strains to pass a BM. He takes a stool softener intermittently. He sees bright red blood on the toilet tissue almost every time he passes a BM. He sometimes sees a small amount of red blood in the toilet water. He reported seeing a  black solid stool on one occasion 1 or 2 weeks ago. No Pepto bismal use. He takes ASA 81mg  daily. He takes Mobic 7.5mg  po bid. He reported having a colonoscopy by Dr. Chales Abrahams at least 5 years ago which was normal. I will request a copy of his colonoscopy results for further review.   He stated he was contacted by Dr. Mallory Shirk office, no further plans for a cardiac catheterization. He stated his cardiac catheterization was no longer required as his abnormal cardiac testing was most likely due to his anemia.    Past Medical History:  Diagnosis Date  . Anxiety disorder   . BPH (benign prostatic hyperplasia)   . Colon polyps   . DDD (degenerative disc disease), cervical   . Depression   . Generalized osteoarthritis of multiple sites   . Gout   . Hyperlipidemia   . Hypertension, benign   . Laryngopharyngeal reflux   . Tobacco abuse   . Uncontrolled type 2 diabetes mellitus with peripheral neuropathy [facility])    Past Surgical History:  Procedure Laterality Date  . HERNIA REPAIR  1998   Abdominal  . MINOR HEMORRHOIDECTOMY  1998  . NASAL SINUS SURGERY  1978  . SHOULDER SURGERY Left    X2  . TONSILLECTOMY  1967    reports that he quit smoking about 6 years ago. He has quit using smokeless tobacco.  His smokeless tobacco use included chew. He reports previous alcohol use. He reports that he does not use drugs. family  history includes Alcohol abuse in his father; Diabetes in his brother; Lung cancer in his father; Stroke in his father. Allergies  Allergen Reactions  . Pseudoephedrine Other (See Comments)    Other Reaction: Not Assessed Other Reaction: Not Assessed   . Penicillins Rash  . Tetracyclines & Related Rash      Outpatient Encounter Medications as of 05/31/2019  Medication Sig  . amLODipine (NORVASC) 5 MG tablet Take 5 mg by mouth daily.   Marland Kitchen aspirin EC 81 MG tablet Take 1 tablet (81 mg total) by mouth daily.  Marland Kitchen atorvastatin (LIPITOR) 80 MG tablet Take 80 mg by mouth at bedtime.    . cetirizine (ZYRTEC) 10 MG tablet Take 10 mg by mouth at bedtime.   . citalopram (CELEXA) 20 MG tablet Take 20 mg by mouth daily.  . ferrous sulfate 325 (65 FE) MG tablet Take 1 tablet (325 mg total) by mouth every other day.  . furosemide (LASIX) 20 MG tablet Take 1 tablet (20 mg total) by mouth daily.  Marland Kitchen gemfibrozil (LOPID) 600 MG tablet Take 600 mg by mouth 2 (two) times daily before a meal.  . glimepiride (AMARYL) 4 MG tablet Take 4 mg by mouth 2 (two) times daily.  Marland Kitchen losartan (COZAAR) 100 MG tablet Take 100 mg by mouth at bedtime.   . meloxicam (MOBIC) 7.5 MG tablet Take 7.5 mg by mouth 2 (two) times daily.  . metFORMIN (GLUCOPHAGE) 1000 MG tablet Take 1,000 mg by mouth 2 (two) times daily with a meal.  . metoprolol succinate (TOPROL XL) 25 MG 24 hr tablet Take 0.5 tablets (12.5 mg total) by mouth daily.  . montelukast (SINGULAIR) 10 MG tablet Take 10 mg by mouth daily.   . Multiple Vitamins-Minerals (ADULT GUMMY PO) Take 2 tablets by mouth daily.  . naphazoline-pheniramine (NAPHCON-A) 0.025-0.3 % ophthalmic solution Place 1 drop into both eyes 3 (three) times daily as needed for eye irritation (dry/irritated/allergy eyes.).  Marland Kitchen NOVOLOG FLEXPEN 100 UNIT/ML FlexPen Inject 20 Units into the skin in the morning, at noon, and at bedtime.   . ondansetron (ZOFRAN) 4 MG tablet Take 1 tablet by mouth as needed.  . pantoprazole (PROTONIX) 40 MG tablet Take 1 tablet by mouth daily.  . potassium chloride SA (KLOR-CON) 20 MEQ tablet Take 1 tablet (20 mEq total) by mouth daily.  . tamsulosin (FLOMAX) 0.4 MG CAPS capsule Take 0.4 mg by mouth daily.  [name] Williams FLEXTOUCH 100 UNIT/ML FlexTouch Pen Inject 60 Units into the skin at bedtime.   . nitroGLYCERIN (NITROSTAT) 0.4 MG SL tablet Place 1 tablet (0.4 mg total) under the tongue every 5 (five) minutes as needed for chest pain. (Patient not taking: Reported on 05/31/2019)  . polyethylene glycol (MIRALAX) 17 g packet Take 17 g by mouth daily as needed for  mild constipation, moderate constipation or severe constipation. (Patient not taking: Reported on 05/31/2019)  . [DISCONTINUED] omeprazole (PRILOSEC) 40 MG capsule Take 40 mg by mouth every evening.    No facility-administered encounter medications on file as of 05/31/2019.     REVIEW OF SYSTEMS: All other systems reviewed and negative except where noted in the History of Present Illness.   PHYSICAL EXAM: BP 100/60 (BP Location: Left Arm, Patient Position: Sitting, Cuff Size: Normal)   Pulse 76   Temp 98.7 F (37.1 C)   Ht 5\' 10"  (1.778 m) Comment: height measured without shoes  Wt 269 lb 8 oz (122.2 kg)   BMI 38.67 kg/m  General: Well developed  69 year old male in NAD.  Head: Normocephalic and atraumatic. Eyes:  Sclerae non-icteric, conjunctive pink. Ears: Normal auditory acuity. Mouth: Dentition intact. No ulcers or lesions.  Neck: Supple, no lymphadenopathy or thyromegaly.  Lungs: Clear bilaterally to auscultation without wheezes, crackles or rhonchi. Heart: Regular rate and rhythm. No murmur, rub or gallop appreciated.  Abdomen: Soft, nontender, non distended. No masses. No hepatosplenomegaly. Normoactive bowel sounds x 4 quadrants.  Rectal: Anal hemorrhoids with a fissure within the posterior anal hemorrhoid without evidence of active bleeding, right lateral internal hemorrhoid > LL. Right external hemorrhoid with hypertrophied papilla. A skin tag left of the anal opening without inflammation or bleeding.  Musculoskeletal: Symmetrical with no gross deformities. Skin: Warm and dry. No rash or lesions on visible extremities. Extremities: No edema. Neurological: Alert oriented x 4, no focal deficits.  Psychological:  Alert and cooperative. Normal mood and affect.  ASSESSMENT AND PLAN:  36. 69 year old male with hematochezia and anemia. Hg 6.6 in the ED 4/14. He received 2 units of PRBCs. Post transfusion Hg 7.9 -> 7.6 -> 8.2. Stable Hg 10.1 on 4/27.  -CBC, Iron panel,  BMP -Anusol HC 25mg  suppository -Desitin apply a small amount inside the anal area tid (patient wasn't sure he could reach to apply Desitin) -Cardiac clearance by Dr. Servando Salina prior to schedule EGD/colonoscopy  -Proceed with office appointment with Dr. Chales Abrahams 06/07/2019 as scheduled   2. GERD -Continue Pantoprazole 40mg  daily -See plan in # 1  3. Chest pain and SOB, most likely due to anemia      CC:  Paulina Fusi, MD

## 2019-05-31 NOTE — Patient Instructions (Addendum)
If you are age 70 or older, your body mass index should be between 23-30. Your Body mass index is 38.67 kg/m. If this is out of the aforementioned range listed, please consider follow up with your Primary Care Provider.  If you are age 39 or younger, your body mass index should be between 19-25. Your Body mass index is 38.67 kg/m. If this is out of the aformentioned range listed, please consider follow up with your Primary Care Provider.   Anusol suppositories 1 at bedtime for 5 days  Follow up with your cardiologist on 06/08/2019 we will need a cardiac clearance to proceed with your endoscopy/colonoscopy  Your provider has requested that you go to the basement level for lab work before leaving today. Press "B" on the elevator. The lab is located at the first door on the left as you exit the elevator.

## 2019-06-02 ENCOUNTER — Encounter: Payer: Self-pay | Admitting: Cardiology

## 2019-06-06 ENCOUNTER — Other Ambulatory Visit: Payer: Self-pay

## 2019-06-06 DIAGNOSIS — D509 Iron deficiency anemia, unspecified: Secondary | ICD-10-CM

## 2019-06-07 ENCOUNTER — Ambulatory Visit: Payer: Medicare PPO | Admitting: Gastroenterology

## 2019-06-08 ENCOUNTER — Other Ambulatory Visit: Payer: Self-pay

## 2019-06-08 ENCOUNTER — Ambulatory Visit: Payer: Medicare PPO | Admitting: Cardiology

## 2019-06-08 ENCOUNTER — Encounter: Payer: Self-pay | Admitting: Cardiology

## 2019-06-08 VITALS — BP 124/70 | HR 83 | Ht 70.0 in | Wt 267.0 lb

## 2019-06-08 DIAGNOSIS — I10 Essential (primary) hypertension: Secondary | ICD-10-CM

## 2019-06-08 DIAGNOSIS — I2510 Atherosclerotic heart disease of native coronary artery without angina pectoris: Secondary | ICD-10-CM

## 2019-06-08 DIAGNOSIS — K922 Gastrointestinal hemorrhage, unspecified: Secondary | ICD-10-CM

## 2019-06-08 DIAGNOSIS — E782 Mixed hyperlipidemia: Secondary | ICD-10-CM | POA: Diagnosis not present

## 2019-06-08 DIAGNOSIS — Z720 Tobacco use: Secondary | ICD-10-CM | POA: Diagnosis not present

## 2019-06-08 NOTE — Progress Notes (Signed)
Cardiology Office Note:    Date:  06/08/2019   ID:  [name] Williams, DOB Feb 23, 1950, MRN [id number]  PCP:  Paulina Fusi, MD  Cardiologist:  Thomasene Ripple, DO  Electrophysiologist:  None   Referring MD: Paulina Fusi, MD   Chief Complaint  Patient presents with  . Follow-up   History of Present Illness:    [name] Williams is a 69 y.o. male with a hx of diabetes mellitus, hypertension, obesity, hyperlipidemia, coronary artery disease by coronary CTA and possible FFR.  The patient is planning cardiac catheterization he was noted to have a hemoglobin of 6.8 and he was sent to the hospital at which time he got 2 packed cells of red blood cells.  He still pending follow-up with GI for endoscopy.  He is here today for follow-up visit.  Tells me that he feels a little better but he still is short of breath and his chest pain still is intermittent.  Past Medical History:  Diagnosis Date  . Anxiety disorder   . BPH (benign prostatic hyperplasia)   . Colon polyps   . DDD (degenerative disc disease), cervical   . Depression   . Generalized osteoarthritis of multiple sites   . Gout   . Hyperlipidemia   . Hypertension, benign   . Laryngopharyngeal reflux   . Tobacco abuse   . Uncontrolled type 2 diabetes mellitus with peripheral neuropathy [facility])     Past Surgical History:  Procedure Laterality Date  . HERNIA REPAIR  1998   Abdominal  . MINOR HEMORRHOIDECTOMY  1998  . NASAL SINUS SURGERY  1978  . SHOULDER SURGERY Left    X2  . TONSILLECTOMY  1967    Current Medications: Current Meds  Medication Sig  . amLODipine (NORVASC) 5 MG tablet Take 5 mg by mouth daily.   Marland Kitchen atorvastatin (LIPITOR) 80 MG tablet Take 80 mg by mouth at bedtime.   . cetirizine (ZYRTEC) 10 MG tablet Take 10 mg by mouth at bedtime.   . citalopram (CELEXA) 20 MG tablet Take 20 mg by mouth daily.  . ferrous sulfate 325 (65 FE) MG tablet Take 1 tablet (325 mg total) by mouth every other day.  . furosemide  (LASIX) 20 MG tablet Take 1 tablet (20 mg total) by mouth daily.  Marland Kitchen gemfibrozil (LOPID) 600 MG tablet Take 600 mg by mouth 2 (two) times daily before a meal.  . glimepiride (AMARYL) 4 MG tablet Take 4 mg by mouth 2 (two) times daily.  . hydrocortisone (ANUSOL-HC) 25 MG suppository Place 1 suppository (25 mg total) rectally at bedtime.  Marland Kitchen losartan (COZAAR) 100 MG tablet Take 100 mg by mouth at bedtime.   . meloxicam (MOBIC) 7.5 MG tablet Take 7.5 mg by mouth 2 (two) times daily.  . metFORMIN (GLUCOPHAGE) 1000 MG tablet Take 1,000 mg by mouth 2 (two) times daily with a meal.  . metoprolol succinate (TOPROL XL) 25 MG 24 hr tablet Take 0.5 tablets (12.5 mg total) by mouth daily.  . montelukast (SINGULAIR) 10 MG tablet Take 10 mg by mouth daily.   . Multiple Vitamins-Minerals (ADULT GUMMY PO) Take 2 tablets by mouth daily.  . naphazoline-pheniramine (NAPHCON-A) 0.025-0.3 % ophthalmic solution Place 1 drop into both eyes 3 (three) times daily as needed for eye irritation (dry/irritated/allergy eyes.).  Marland Kitchen nitroGLYCERIN (NITROSTAT) 0.4 MG SL tablet Place 1 tablet (0.4 mg total) under the tongue every 5 (five) minutes as needed for chest pain.  Marland Kitchen NOVOLOG FLEXPEN 100 UNIT/ML  FlexPen Inject 20 Units into the skin in the morning, at noon, and at bedtime.   . ondansetron (ZOFRAN) 4 MG tablet Take 1 tablet by mouth as needed.  . pantoprazole (PROTONIX) 40 MG tablet Take 1 tablet by mouth daily.  . potassium chloride SA (KLOR-CON) 20 MEQ tablet Take 1 tablet (20 mEq total) by mouth daily.  Rolene Arbour BOWEL PREP KIT 17.5-3.13-1.6 GM/177ML SOLN Take 1 kit by mouth as directed. For colonoscopy prep  . tamsulosin (FLOMAX) 0.4 MG CAPS capsule Take 0.4 mg by mouth daily.  . traMADol (ULTRAM) 50 MG tablet 50 mg daily.  Evaristo Bury FLEXTOUCH 100 UNIT/ML FlexTouch Pen Inject 60 Units into the skin at bedtime.      Allergies:   Pseudoephedrine, Penicillins, and Tetracyclines & related   Social History   Socioeconomic  History  . Marital status: Married    Spouse name: Not on file  . Number of children: Not on file  . Years of education: Not on file  . Highest education level: Not on file  Occupational History  . Not on file  Tobacco Use  . Smoking status: Former Smoker    Quit date: 2015    Years since quitting: 6.3  . Smokeless tobacco: Former Neurosurgeon    Types: Chew  Substance and Sexual Activity  . Alcohol use: Not Currently  . Drug use: Never  . Sexual activity: Not on file  Other Topics Concern  . Not on file  Social History Narrative  . Not on file   Social Determinants of Health   Financial Resource Strain:   . Difficulty of Paying Living Expenses:   Food Insecurity:   . Worried About Programme researcher, broadcasting/film/video in the Last Year:   . Barista in the Last Year:   Transportation Needs:   . Freight forwarder (Medical):   Marland Kitchen Lack of Transportation (Non-Medical):   Physical Activity:   . Days of Exercise per Week:   . Minutes of Exercise per Session:   Stress:   . Feeling of Stress :   Social Connections:   . Frequency of Communication with Friends and Family:   . Frequency of Social Gatherings with Friends and Family:   . Attends Religious Services:   . Active Member of Clubs or Organizations:   . Attends Banker Meetings:   Marland Kitchen Marital Status:      Family History: The patient's family history includes Alcohol abuse in his father; Diabetes in his brother; Lung cancer in his father; Stroke in his father.  ROS:   Review of Systems  Constitution: Negative for decreased appetite, fever and weight gain.  HENT: Negative for congestion, ear discharge, hoarse voice and sore throat.   Eyes: Negative for discharge, redness, vision loss in right eye and visual halos.  Cardiovascular: Negative for chest pain, dyspnea on exertion, leg swelling, orthopnea and palpitations.  Respiratory: Negative for cough, hemoptysis, shortness of breath and snoring.   Endocrine: Negative  for heat intolerance and polyphagia.  Hematologic/Lymphatic: Negative for bleeding problem. Does not bruise/bleed easily.  Skin: Negative for flushing, nail changes, rash and suspicious lesions.  Musculoskeletal: Negative for arthritis, joint pain, muscle cramps, myalgias, neck pain and stiffness.  Gastrointestinal: Negative for abdominal pain, bowel incontinence, diarrhea and excessive appetite.  Genitourinary: Negative for decreased libido, genital sores and incomplete emptying.  Neurological: Negative for brief paralysis, focal weakness, headaches and loss of balance.  Psychiatric/Behavioral: Negative for altered mental status, depression and suicidal  ideas.  Allergic/Immunologic: Negative for HIV exposure and persistent infections.    EKGs/Labs/Other Studies Reviewed:    The following studies were reviewed today:   EKG: None today  CCTA Aorta:05/10/19  Normal size. Calcification of the aortic root and descending aorta. No dissection. Aortic Valve: Trileaflet. No calcifications. Coronary Arteries: Normal coronary origin. CO-dominance. RCA is a large dominant artery that gives rise to PDA and PLVB. There is a mild (25-49%) mixed plaque in the proximal RCA. The mid and distal portion is not well visualized due to blooming artifact. Left main is a large artery that gives rise to LAD and LCX arteries. The left main with a mild (25-49%) mixed plaque.  LAD is a large vessel. There is a mild (25-49%) calcified plaque in the proximal LAD. The mid LAD with moderate (50-69%) calcified plaque. The mid-distal portion of the vessel with mild (25-49%). D1 is a small vessel with no plaque.  LCX is a co-dominant artery that gives rise to one large OM1 branch. The proximal LCX with lumpy-bumpy diffuse mild (25-49%) lesions. The mid LCX with minimal (<24%) soft plaque. OM1 with mild (25-49%) soft plaque.  Other findings:  Normal pulmonary vein drainage into the left atrium.  Normal  left atrial appendage without a thrombus.  Normal size of the pulmonary artery.  IMPRESSION: 1. Coronary calcium score of 1067. This was 16 percentile for age and sex matched control.  2. Normal coronary origin with Co-Dominant system.  3. Moderate CAD. CADRADS 3. This study will be sent for FFRct.  4. Aorta root and descending aorta atherosclerosis.   Recent Labs: 02/24/2019: Magnesium 1.8 05/18/2019: ALT 16 05/31/2019: BUN 19; Creatinine, Ser 0.91; Hemoglobin 10.1; Platelets 369.0; Potassium 4.5; Sodium 134  Recent Lipid Panel No results found for: CHOL, TRIG, HDL, CHOLHDL, VLDL, LDLCALC, LDLDIRECT  Physical Exam:    VS:  BP 124/70 (BP Location: Left Arm, Patient Position: Sitting, Cuff Size: Normal)   Pulse 83   Ht 5\' 10"  (1.778 m)   Wt 267 lb (121.1 kg)   SpO2 94%   BMI 38.31 kg/m     Wt Readings from Last 3 Encounters:  06/08/19 267 lb (121.1 kg)  05/31/19 269 lb 8 oz (122.2 kg)  05/18/19 273 lb (123.8 kg)     GEN: Well nourished, well developed in no acute distress HEENT: Normal NECK: No JVD; No carotid bruits LYMPHATICS: No lymphadenopathy CARDIAC: S1S2 noted,RRR, no murmurs, rubs, gallops RESPIRATORY:  Clear to auscultation without rales, wheezing or rhonchi  ABDOMEN: Soft, non-tender, non-distended, +bowel sounds, no guarding. EXTREMITIES: No edema, No cyanosis, no clubbing MUSCULOSKELETAL:  No deformity  SKIN: Warm and dry NEUROLOGIC:  Alert and oriented x 3, non-focal PSYCHIATRIC:  Normal affect, good insight  ASSESSMENT:    1. Gastrointestinal hemorrhage, unspecified gastrointestinal hemorrhage type   2. Hypertension, benign   3. Tobacco abuse   4. Mixed hyperlipidemia   5. Coronary artery disease involving native coronary artery of native heart, angina presence unspecified    PLAN:     I still would like to proceed with ischemic evaluation but first after he get his colonoscopy done and rule out any source of GI bleeding.  For now we  will continue him on his current medication regimen.  Discussed with the patient.  His anoscopy has been scheduled for Jun 20, 2019.  CAD-continue with aspirin 81 mg daily and Lipitor 80 mg daily.  Recent concern for GI bleeding-heart repeat his CBC today.  Continue patient his Protonix per GI.  He is pending endoscopy   The patient is in agreement with the above plan. The patient left the office in stable condition.  The patient will follow up as scheduled on July 08, 2019   Medication Adjustments/Labs and Tests Ordered: Current medicines are reviewed at length with the patient today.  Concerns regarding medicines are outlined above.  Orders Placed This Encounter  Procedures  . CBC   No orders of the defined types were placed in this encounter.   Patient Instructions  Medication Instructions:  Your physician recommends that you continue on your current medications as directed. Please refer to the Current Medication list given to you today.  *If you need a refill on your cardiac medications before your next appointment, please call your pharmacy*   Lab Work: CBC- Today   If you have labs (blood work) drawn today and your tests are completely normal, you will receive your results only by: Marland Kitchen MyChart Message (if you have MyChart) OR . A paper copy in the mail If you have any lab test that is abnormal or we need to change your treatment, we will call you to review the results.   Testing/Procedures: None ordered    Follow-Up: You are scheduled to see Thomasene Ripple, DO on 07/08/2019 @ 2:55 PM   Other Instructions None      Adopting a Healthy Lifestyle.  Know what a healthy weight is for you (roughly BMI <25) and aim to maintain this   Aim for 7+ servings of fruits and vegetables daily   65-80+ fluid ounces of water or unsweet tea for healthy kidneys   Limit to max 1 drink of alcohol per day; avoid smoking/tobacco   Limit animal fats in diet for cholesterol and heart  health - choose grass fed whenever available   Avoid highly processed foods, and foods high in saturated/trans fats   Aim for low stress - take time to unwind and care for your mental health   Aim for 150 min of moderate intensity exercise weekly for heart health, and weights twice weekly for bone health   Aim for 7-9 hours of sleep daily   When it comes to diets, agreement about the perfect plan isnt easy to find, even among the experts. Experts at the [facility] [employer] developed an idea known as the Healthy Eating Plate. Just imagine a plate divided into logical, healthy portions.   The emphasis is on diet quality:   Load up on vegetables and fruits - one-half of your plate: Aim for color and variety, and remember that potatoes dont count.   Go for whole grains - one-quarter of your plate: Whole wheat, barley, wheat berries, quinoa, oats, brown rice, and foods made with them. If you want pasta, go with whole wheat pasta.   Protein power - one-quarter of your plate: Fish, chicken, beans, and nuts are all healthy, versatile protein sources. Limit red meat.   The diet, however, does go beyond the plate, offering a few other suggestions.   Use healthy plant oils, such as olive, canola, soy, corn, sunflower and peanut. Check the labels, and avoid partially hydrogenated oil, which have unhealthy trans fats.   If youre thirsty, drink water. Coffee and tea are good in moderation, but skip sugary drinks and limit milk and dairy products to one or two daily servings.   The type of carbohydrate in the diet is more important than the amount. Some sources of carbohydrates, such as vegetables, fruits, whole grains,  and beans-are healthier than others.   Finally, stay active  Signed, Thomasene Ripple, DO  06/08/2019 3:31 PM    [facility]

## 2019-06-08 NOTE — Patient Instructions (Signed)
Medication Instructions:  Your physician recommends that you continue on your current medications as directed. Please refer to the Current Medication list given to you today.  *If you need a refill on your cardiac medications before your next appointment, please call your pharmacy*   Lab Work: CBC- Today   If you have labs (blood work) drawn today and your tests are completely normal, you will receive your results only by: Marland Kitchen MyChart Message (if you have MyChart) OR . A paper copy in the mail If you have any lab test that is abnormal or we need to change your treatment, we will call you to review the results.   Testing/Procedures: None ordered    Follow-Up: You are scheduled to see Berniece Salines, DO on 07/08/2019 @ 2:55 PM   Other Instructions None

## 2019-06-09 ENCOUNTER — Telehealth: Payer: Self-pay

## 2019-06-09 LAB — CBC
Hematocrit: 34.8 % — ABNORMAL LOW (ref 37.5–51.0)
Hemoglobin: 10.7 g/dL — ABNORMAL LOW (ref 13.0–17.7)
MCH: 24.7 pg — ABNORMAL LOW (ref 26.6–33.0)
MCHC: 30.7 g/dL — ABNORMAL LOW (ref 31.5–35.7)
MCV: 80 fL (ref 79–97)
Platelets: 420 x10E3/uL (ref 150–450)
RBC: 4.34 x10E6/uL (ref 4.14–5.80)
WBC: 10.6 x10E3/uL (ref 3.4–10.8)

## 2019-06-09 NOTE — Telephone Encounter (Signed)
Spoke with patient regarding results.  Patient verbalizes understanding and is agreeable to plan of care. Advised patient to call back with any issues or concerns.  

## 2019-06-09 NOTE — Telephone Encounter (Signed)
-----   Message from Berniece Salines, DO sent at 06/09/2019  2:29 PM EDT ----- Labs stable since April 27.

## 2019-06-17 ENCOUNTER — Telehealth: Payer: Self-pay | Admitting: Cardiology

## 2019-06-17 DIAGNOSIS — K625 Hemorrhage of anus and rectum: Secondary | ICD-10-CM

## 2019-06-17 DIAGNOSIS — D509 Iron deficiency anemia, unspecified: Secondary | ICD-10-CM

## 2019-06-17 NOTE — Telephone Encounter (Signed)
Spoke to the patient and let him know that the referral was placed and our latest office note was faxed over to their office as well. He verbalizes understanding. No other issues or concerns noted at this time.   Encouraged patient to call back with any questions or concerns.

## 2019-06-17 NOTE — Telephone Encounter (Signed)
Patient would like to know if Dr. Harriet Masson could refer him to another GI Doctor in [place]. The patient was scheduled at [facility] in [place] but the provider had a family emergency and needed to postpone his procedure. The patient would have to wait at least a month to have it done in [place] and would rather just have it done in [place]. Please let the patient know what the office recommends

## 2019-06-17 NOTE — Telephone Encounter (Signed)
We can refer him to Dr. Lyda Jester who is here in [place]

## 2019-06-17 NOTE — Addendum Note (Signed)
Addended by: Resa Miner I on: 06/17/2019 02:25 PM   Modules accepted: Orders

## 2019-06-20 ENCOUNTER — Telehealth: Payer: Self-pay | Admitting: Gastroenterology

## 2019-06-20 ENCOUNTER — Telehealth: Payer: Self-pay | Admitting: Cardiology

## 2019-06-20 ENCOUNTER — Encounter: Payer: Medicare PPO | Admitting: Gastroenterology

## 2019-06-20 NOTE — Telephone Encounter (Signed)
Lori from Dr. Lyda Jester [facility] is calling in regards to the referral Dr. Harriet Masson sent for the patient to be seen by Dr. Lyda Jester. Cecille Rubin states Dr. Lyda Jester will not be able to see patients until July due to some personal health issues he is getting treated for and wanted to make Dr. Harriet Masson aware since [name] Williams would be a new patient. Please advise.

## 2019-06-20 NOTE — Telephone Encounter (Signed)
Spoke to patient just now in regards to this and he states that he would like to see Dr. Lyda Jester in July. I told him that their office would be calling to schedule this appointment. No other issues or concerns were noted. I will route this to Dr. Harriet Masson just as an Juluis Rainier.

## 2019-06-21 DIAGNOSIS — D500 Iron deficiency anemia secondary to blood loss (chronic): Secondary | ICD-10-CM | POA: Diagnosis not present

## 2019-06-21 DIAGNOSIS — K921 Melena: Secondary | ICD-10-CM | POA: Diagnosis not present

## 2019-06-21 NOTE — Telephone Encounter (Signed)
I have called both numbers listed on patients chart his home number is busy and voicemail is full on patients cell phone. I will attempt to call patient back again at another time.

## 2019-06-22 DIAGNOSIS — D500 Iron deficiency anemia secondary to blood loss (chronic): Secondary | ICD-10-CM | POA: Diagnosis not present

## 2019-06-22 LAB — IFOBT (OCCULT BLOOD): IFOBT: POSITIVE

## 2019-06-23 DIAGNOSIS — D500 Iron deficiency anemia secondary to blood loss (chronic): Secondary | ICD-10-CM | POA: Diagnosis not present

## 2019-06-28 ENCOUNTER — Telehealth: Payer: Self-pay | Admitting: Cardiology

## 2019-06-28 DIAGNOSIS — M109 Gout, unspecified: Secondary | ICD-10-CM | POA: Diagnosis not present

## 2019-06-28 DIAGNOSIS — J069 Acute upper respiratory infection, unspecified: Secondary | ICD-10-CM | POA: Diagnosis not present

## 2019-06-28 NOTE — Telephone Encounter (Signed)
New Message    Dr Melina Copa is calling to speak  With Dr Harriet Masson    Please call back

## 2019-06-29 ENCOUNTER — Telehealth: Payer: Self-pay | Admitting: Cardiology

## 2019-06-29 NOTE — Telephone Encounter (Signed)
   Estill Bamberg from [facility] calling, she would like to DR. Tobb know that Dr. Melina Copa referred pt to Dr. Lyndel Safe, we will be taking care of him for his anemia. Since Dr. Melina Copa is an out patient clinic they cannot take care of the pt there.

## 2019-06-29 NOTE — Telephone Encounter (Signed)
New Message   Becky with Dr. Delena Bali (PCP) office is calling to advise that the patient is being referred back to Dr. Lyndel Safe to determine if he will need a colonoscopy first or a stent. She wanted Dr. Harriet Masson to know. She is aware that Estill Bamberg with [facility] did let us know that the patient will have to wait and see Dr. Lyndel Safe. But she still wanted a message to be sent to Dr. Harriet Masson.

## 2019-06-30 NOTE — Progress Notes (Deleted)
06/30/2019 MEET [name] Williams [id number] Jun 01, 1950   Chief Complaint:  History of Present Illness: [name] Williams with a past medical history of anxiety, depression, hypertension, hyperlipidemia, DM II, gout, GERD, colon polyps and IDA. He presents today accompanied by his sister, [name], for further evaluation for anemia.  I initially saw the patient in office on 05/31/2019. As previously reviewed, he was seen in office by cardiologist Dr. Thomasene Ripple on 05/17/2019 due to having chest pain and SOB.  A  pharmacologic stress test that was reportedly normal with no evidence of ischemia. A coronary CTA was done 4/6 which showed moderate coronary artery disease. A FFR CT wasabnormal with flow-limiting lesions in the LAD and left circumflex arteries. He was started on ASA 81mg  daily and Atorvastatin 80mg  daily. A cardiac catheterization was scheduled for 05/20/2018 which was not completed as he was diagnosed with profound anemia. Labs done by his PCP 05/17/2019 showed a Hg 6.8. He continued to have chest tightness, SOB and he had lower abdominal burning pain. He was sent directly to [facility] ED on 05/18/2019. Labs in the ED showed Hg 6.6 , WBC 11.5 and Glucose of 43. He received 2 units of PRBCs. His Hg level fluctuated post transfusion  7.9 -> 7.6 -> to 8.2.  He was discharged home on 4/152021. He saw his PCP Dr. Tomasa Blase on 05/31/2019 and laboratory studies done showed Hg 10.1. HCT 32.8. MCV 75.5. PLT 369. Iron 63. Ferritin 49.9. Transferrin 426. At the time of his office visit, a cardiac clearance was required prior to proceeding with an EGD and colonoscopy. An EGD and colonoscopy were scheduled 07/2019. He presents today to proceed with an EGD and colonoscopy asap as recommended by cardiologist Dr. Carlena Sax as he was assessed to have significant CAD per coronary CT. He is requiring a cardiac catheterization with possible stent to his LAD and anticoagulation which will be done after he completes his GI work up. Hg stable  at 10.6 on 06/08/2019.   Has IDA with heme + stools, Hb 6.0 s/p 2U PRBC/ PO iron to Hb 11. Has significant CAD and is being followed by Dr. Carlena Sax (cardiology). She would like to get GI work-up done ASAP so that they can go ahead and do cardiac cath. May need LAD stent and anticoagulation.   CBC Latest Ref Rng & Units 06/08/2019 05/31/2019 05/19/2019  WBC 3.4 - 10.8 x10E3/uL 10.6 9.4 10.5  Hemoglobin 13.0 - 17.7 g/dL 10.7(L) 10.1(L) 8.2(L)  Hematocrit 37.5 - 51.0 % 34.8(L) 32.8(L) 30.7(L)  Platelets 150 - 450 x10E3/uL 420 369.0 449(H)   CMP Latest Ref Rng & Units 05/31/2019 05/18/2019 05/18/2019  Glucose 70 - 99 mg/dL 324(M) 010(U) 72(ZD)  BUN 6 - 23 mg/dL 19 11 12   Creatinine 0.40 - 1.50 mg/dL 6.64 4.03 4.74  Sodium 135 - 145 mEq/L 134(L) 139 139  Potassium 3.5 - 5.1 mEq/L 4.5 4.2 4.0  Chloride 96 - 112 mEq/L 97 105 107  CO2 19 - 32 mEq/L 30 26 23   Calcium 8.4 - 10.5 mg/dL 9.8 9.5 9.3  Total Protein 6.5 - 8.1 g/dL - - 7.5  Total Bilirubin 0.3 - 1.2 mg/dL - - 0.8  Alkaline Phos 38 - 126 U/L - - 63  AST 15 - 41 U/L - - 18  ALT 0 - 44 U/L - - 16   Iron/TIBC/Ferritin/ %Sat    Component Value Date/Time   IRON 63 05/31/2019 1541   TIBC 659 (H) 05/18/2019 1933   FERRITIN 49.9  05/31/2019 1541   IRONPCTSAT 10.6 (L) 05/31/2019 1541     Current Medications, Allergies, Past Medical History, Past Surgical History, Family History and Social History were reviewed in [employer] record.   Physical Exam: There were no vitals taken for this visit. General: Well developed, w   ***male in no acute distress. Head: Normocephalic and atraumatic. Eyes: No scleral icterus. Conjunctiva pink . Ears: Normal auditory acuity. Mouth: Dentition intact. No ulcers or lesions.  Lungs: Clear throughout to auscultation. Heart: Regular rate and rhythm, no murmur. Abdomen: Soft, nontender and nondistended. No masses or hepatomegaly. Normal bowel sounds x 4 quadrants.  Rectal:  *** Musculoskeletal: Symmetrical with no gross deformities. Extremities: No edema. Neurological: Alert oriented x 4. No focal deficits.  Psychological: Alert and cooperative. Normal mood and affect  Assessment and Recommendations:  70. 69 year old male with hematochezia and anemia. EGD and colonoscopy at [facility] asap required prior to proceeding with a cardiac catheterization. -EGD and colonoscopy at [facility] benefits and risks discussed including risk with sedation, risk of bleeding, perforation and infection   2. Coronary artery disease, requiring a cardiac catheterization with possible stent placement to the LAD.   3. GERD -See plan in # 1

## 2019-07-01 ENCOUNTER — Telehealth: Payer: Self-pay | Admitting: General Surgery

## 2019-07-01 ENCOUNTER — Ambulatory Visit: Payer: Medicare PPO | Admitting: Nurse Practitioner

## 2019-07-01 NOTE — Telephone Encounter (Signed)
[name] Williams, this is the patient who needed an urgent office appointment in order to schedule an egd/colonoscopy which is required prior to proceeding with a cardiac catheterization. I opened up my 1pm schedule today to accommodate this urgent appointment per your phone call yesterday. Please call the patient and schedule an appointment asap next week. Let me know outcome. Thank you.

## 2019-07-01 NOTE — Telephone Encounter (Signed)
Contacted the patient at 1:15 because he did not show for his 1:00 appointment with Jaclyn Shaggy, NP. The patient was confused and stated he did not know he had this appointment. The appointment was arranged between Gloria/Wilho Sharpley at Dr Leland Her request. He stated he would call back to reschedule at another time.

## 2019-07-05 ENCOUNTER — Ambulatory Visit: Payer: Medicare HMO | Admitting: Cardiology

## 2019-07-08 ENCOUNTER — Ambulatory Visit (INDEPENDENT_AMBULATORY_CARE_PROVIDER_SITE_OTHER): Payer: Medicare HMO | Admitting: Cardiology

## 2019-07-08 ENCOUNTER — Encounter: Payer: Self-pay | Admitting: Cardiology

## 2019-07-08 ENCOUNTER — Other Ambulatory Visit: Payer: Self-pay

## 2019-07-08 VITALS — BP 144/82 | HR 96 | Ht 71.0 in | Wt 263.6 lb

## 2019-07-08 DIAGNOSIS — E1142 Type 2 diabetes mellitus with diabetic polyneuropathy: Secondary | ICD-10-CM

## 2019-07-08 DIAGNOSIS — E1165 Type 2 diabetes mellitus with hyperglycemia: Secondary | ICD-10-CM | POA: Diagnosis not present

## 2019-07-08 DIAGNOSIS — IMO0002 Reserved for concepts with insufficient information to code with codable children: Secondary | ICD-10-CM

## 2019-07-08 DIAGNOSIS — I10 Essential (primary) hypertension: Secondary | ICD-10-CM

## 2019-07-08 DIAGNOSIS — K922 Gastrointestinal hemorrhage, unspecified: Secondary | ICD-10-CM

## 2019-07-08 DIAGNOSIS — I2510 Atherosclerotic heart disease of native coronary artery without angina pectoris: Secondary | ICD-10-CM | POA: Diagnosis not present

## 2019-07-08 DIAGNOSIS — I498 Other specified cardiac arrhythmias: Secondary | ICD-10-CM | POA: Diagnosis not present

## 2019-07-08 DIAGNOSIS — E782 Mixed hyperlipidemia: Secondary | ICD-10-CM

## 2019-07-08 NOTE — Progress Notes (Signed)
Cardiology Office Note:    Date:  07/09/2019   ID:  [name] Williams, DOB 23-Aug-1950, MRN [id number]  PCP:  Paulina Fusi, MD  Cardiologist:  Thomasene Ripple, DO  Electrophysiologist:  None   Referring MD: Paulina Fusi, MD   "I am here for a follow up"  History of Present Illness:    [name] Williams is a 69 y.o. male with a hx of diabetes mellitus, hypertension, obesity, hyperlipidemia, coronary artery disease by coronary CTA and possible FFR.  The patient is planning cardiac catheterization he was noted to have a hemoglobin of 6.8 and he was sent to the hospital at which time he got 2 packed cells of red blood cells.    I saw the patient 06/08/2019 at that time he was supposed to see GI. In there interim he was suppose to see GI- Dr Chales Abrahams but called and reported that he would rather see someone in [place]. He then saw Dr. Charm Barges in [place] but he was referred back to Dr. Chales Abrahams for her endoscopy. He has not been able to see Dr. Chales Abrahams - his appointment is scheduled for June 25,2021 he tells me.   He denies chest pain, shortness of breath and lightheadedness. He tells me that recently he has seen blood in his stool and even today he noticed it.  Past Medical History:  Diagnosis Date  . Anemia 05/18/2019  . Anxiety disorder   . BPH (benign prostatic hyperplasia)   . Colon polyps   . DDD (degenerative disc disease), cervical   . Depression   . Dyslipidemia 05/29/2017  . Essential hypertension 05/29/2017  . Generalized osteoarthritis of multiple sites   . Gout   . Hyperlipidemia   . Hypertension, benign   . Laryngopharyngeal reflux   . Morbid obesity (HCC) 11/26/2018  . Shortness of breath 07/17/2017  . Sinus arrhythmia 11/26/2018  . Swelling 05/29/2017  . Tobacco abuse   . Type 2 diabetes mellitus without complication, without long-term current use of insulin (HCC) 05/29/2017  . Uncontrolled type 2 diabetes mellitus with peripheral neuropathy [facility])     Past Surgical History:    Procedure Laterality Date  . HERNIA REPAIR  1998   Abdominal  . MINOR HEMORRHOIDECTOMY  1998  . NASAL SINUS SURGERY  1978  . SHOULDER SURGERY Left    X2  . TONSILLECTOMY  1967    Current Medications: Current Meds  Medication Sig  . allopurinol (ZYLOPRIM) 300 MG tablet Take 300 mg by mouth daily.  Marland Kitchen amLODipine (NORVASC) 5 MG tablet Take 5 mg by mouth daily.   Marland Kitchen atorvastatin (LIPITOR) 80 MG tablet Take 80 mg by mouth at bedtime.   . benzonatate (TESSALON) 200 MG capsule Take 200 mg by mouth daily.  . cetirizine (ZYRTEC) 10 MG tablet Take 10 mg by mouth at bedtime.   . citalopram (CELEXA) 20 MG tablet Take 20 mg by mouth daily.  . ferrous sulfate 325 (65 FE) MG tablet Take 1 tablet (325 mg total) by mouth every other day.  Marland Kitchen gemfibrozil (LOPID) 600 MG tablet Take 600 mg by mouth 2 (two) times daily before a meal.  . glimepiride (AMARYL) 4 MG tablet Take 4 mg by mouth 2 (two) times daily.  Marland Kitchen losartan (COZAAR) 100 MG tablet Take 100 mg by mouth at bedtime.   . meloxicam (MOBIC) 7.5 MG tablet Take 7.5 mg by mouth 2 (two) times daily.  . metFORMIN (GLUCOPHAGE) 1000 MG tablet Take 1,000 mg by mouth 2 (two)  times daily with a meal.  . metoprolol succinate (TOPROL XL) 25 MG 24 hr tablet Take 0.5 tablets (12.5 mg total) by mouth daily.  Marland Kitchen MITIGARE 0.6 MG CAPS Take 0.1 mg by mouth daily.  . montelukast (SINGULAIR) 10 MG tablet Take 10 mg by mouth daily.   . Multiple Vitamins-Minerals (ADULT GUMMY PO) Take 2 tablets by mouth daily.  . naphazoline-pheniramine (NAPHCON-A) 0.025-0.3 % ophthalmic solution Place 1 drop into both eyes 3 (three) times daily as needed for eye irritation (dry/irritated/allergy eyes.).  Marland Kitchen nitroGLYCERIN (NITROSTAT) 0.4 MG SL tablet Place 1 tablet (0.4 mg total) under the tongue every 5 (five) minutes as needed for chest pain.  Marland Kitchen NOVOLOG FLEXPEN 100 UNIT/ML FlexPen Inject 20 Units into the skin in the morning, at noon, and at bedtime.   . ondansetron (ZOFRAN) 4 MG tablet  Take 1 tablet by mouth as needed.  . pantoprazole (PROTONIX) 40 MG tablet Take 1 tablet by mouth daily.  . potassium chloride SA (KLOR-CON) 20 MEQ tablet Take 1 tablet (20 mEq total) by mouth daily.  . tamsulosin (FLOMAX) 0.4 MG CAPS capsule Take 0.4 mg by mouth daily.  . traMADol (ULTRAM) 50 MG tablet 50 mg daily.     Allergies:   Pseudoephedrine, Penicillins, and Tetracyclines & related   Social History   Socioeconomic History  . Marital status: Married    Spouse name: Not on file  . Number of children: Not on file  . Years of education: Not on file  . Highest education level: Not on file  Occupational History  . Not on file  Tobacco Use  . Smoking status: Former Smoker    Quit date: 2015    Years since quitting: 6.4  . Smokeless tobacco: Former Neurosurgeon    Types: Chew  Substance and Sexual Activity  . Alcohol use: Not Currently  . Drug use: Never  . Sexual activity: Not on file  Other Topics Concern  . Not on file  Social History Narrative  . Not on file   Social Determinants of Health   Financial Resource Strain:   . Difficulty of Paying Living Expenses:   Food Insecurity:   . Worried About Programme researcher, broadcasting/film/video in the Last Year:   . Barista in the Last Year:   Transportation Needs:   . Freight forwarder (Medical):   Marland Kitchen Lack of Transportation (Non-Medical):   Physical Activity:   . Days of Exercise per Week:   . Minutes of Exercise per Session:   Stress:   . Feeling of Stress :   Social Connections:   . Frequency of Communication with Friends and Family:   . Frequency of Social Gatherings with Friends and Family:   . Attends Religious Services:   . Active Member of Clubs or Organizations:   . Attends Banker Meetings:   Marland Kitchen Marital Status:      Family History: The patient's family history includes Alcohol abuse in his father; Diabetes in his brother; Lung cancer in his father; Stroke in his father.  ROS:   Review of Systems    Constitution: Negative for decreased appetite, fever and weight gain.  HENT: Negative for congestion, ear discharge, hoarse voice and sore throat.   Eyes: Negative for discharge, redness, vision loss in right eye and visual halos.  Cardiovascular: Negative for chest pain, dyspnea on exertion, leg swelling, orthopnea and palpitations.  Respiratory: Negative for cough, hemoptysis, shortness of breath and snoring.   Endocrine: Negative  for heat intolerance and polyphagia.  Hematologic/Lymphatic: Negative for bleeding problem. Does not bruise/bleed easily.  Skin: Negative for flushing, nail changes, rash and suspicious lesions.  Musculoskeletal: Negative for arthritis, joint pain, muscle cramps, myalgias, neck pain and stiffness.  Gastrointestinal: Negative for abdominal pain, bowel incontinence, diarrhea and excessive appetite.  Genitourinary: Negative for decreased libido, genital sores and incomplete emptying.  Neurological: Negative for brief paralysis, focal weakness, headaches and loss of balance.  Psychiatric/Behavioral: Negative for altered mental status, depression and suicidal ideas.  Allergic/Immunologic: Negative for HIV exposure and persistent infections.    EKGs/Labs/Other Studies Reviewed:    The following studies were reviewed today:   EKG:  None today  Recent Labs: 05/18/2019: ALT 16 07/08/2019: BUN 24; Creatinine, Ser 0.98; Hemoglobin 12.2; Magnesium 1.9; Platelets 389; Potassium 4.6; Sodium 137  Recent Lipid Panel No results found for: CHOL, TRIG, HDL, CHOLHDL, VLDL, LDLCALC, LDLDIRECT  Physical Exam:    VS:  BP (!) 144/82 (BP Location: Right Arm)   Pulse 96   Ht 5\' 11"  (1.803 m)   Wt 263 lb 9.6 oz (119.6 kg)   SpO2 96%   BMI 36.76 kg/m     Wt Readings from Last 3 Encounters:  07/08/19 263 lb 9.6 oz (119.6 kg)  06/08/19 267 lb (121.1 kg)  05/31/19 269 lb 8 oz (122.2 kg)     GEN: Well nourished, well developed in no acute distress HEENT: Normal NECK: No  JVD; No carotid bruits LYMPHATICS: No lymphadenopathy CARDIAC: S1S2 noted,RRR, no murmurs, rubs, gallops RESPIRATORY:  Clear to auscultation without rales, wheezing or rhonchi  ABDOMEN: Soft, non-tender, non-distended, +bowel sounds, no guarding. EXTREMITIES: bilateral +1 leg edema, No cyanosis, no clubbing MUSCULOSKELETAL:  No deformity  SKIN: Warm and dry NEUROLOGIC:  Alert and oriented x 3, non-focal PSYCHIATRIC:  Normal affect, good insight  ASSESSMENT:    1. Essential hypertension   2. Uncontrolled type 2 diabetes mellitus with peripheral neuropathy (HCC)   3. Mixed hyperlipidemia   4. Morbid obesity (HCC)   5. Coronary artery disease involving native coronary artery of native heart without angina pectoris   6. Gastrointestinal hemorrhage, unspecified gastrointestinal hemorrhage type    PLAN:    Patient with recent GI bleeding still pending endoscopy. He tells me that he still sees blood in his stool. He plans to see GI on 6/25. I will get a cbc today to check on his hemoglobin. I do believe he will need egd and colonoscopy. Continue his PPI  Holding off on LHC until he has had a complete GI work for his bleeding. For now in terms of his CAD his aspirin was held until after his GI work up. He will remain on lipitor 80mg  daily. He has prn nitroglycerin.  His blood pressure manually for me today was 138/86 mmHg. I will continue the patient his current antihypertensive regimen.   Hyperlipidemia - continue lipitor 80mg  daily.  Obesity - encouraged weight loss, he plans to increase his exercise and modify his diet.  Blood work will be done today which will include Bmp, mag and cbc.  He does have bilateral leg edema, he has skipped days on his lasix, We discussed he will go back to taking this medication as prescribed with his potassium supplement.  The patient is in agreement with the above plan. The patient left the office in stable condition.  The patient will follow up in 3  months.   Medication Adjustments/Labs and Tests Ordered: Current medicines are reviewed at length with the  patient today.  Concerns regarding medicines are outlined above.  Orders Placed This Encounter  Procedures  . Basic Metabolic Panel (BMET)  . Magnesium  . CBC   No orders of the defined types were placed in this encounter.   Patient Instructions  Medication Instructions:  Your physician recommends that you continue on your current medications as directed. Please refer to the Current Medication list given to you today.  *If you need a refill on your cardiac medications before your next appointment, please call your pharmacy*   Lab Work: Your physician recommends that you return for lab work in: TODAY BMP, Mag, CBC If you have labs (blood work) drawn today and your tests are completely normal, you will receive your results only by: Marland Kitchen MyChart Message (if you have MyChart) OR . A paper copy in the mail If you have any lab test that is abnormal or we need to change your treatment, we will call you to review the results.   Testing/Procedures: None   Follow-Up: At [facility], you and your health needs are our priority.  As part of our continuing mission to provide you with exceptional heart care, we have created designated Provider Care Teams.  These Care Teams include your primary Cardiologist (physician) and Advanced Practice Providers (APPs -  Physician Assistants and Nurse Practitioners) who all work together to provide you with the care you need, when you need it.  We recommend signing up for the patient portal called "MyChart".  Sign up information is provided on this After Visit Summary.  MyChart is used to connect with patients for Virtual Visits (Telemedicine).  Patients are able to view lab/test results, encounter notes, upcoming appointments, etc.  Non-urgent messages can be sent to your provider as well.   To learn more about what you can do with MyChart, go to  [email or web address].    Your next appointment:   3 month(s)  The format for your next appointment:   In Person  Provider:   Thomasene Ripple, DO   Other Instructions      Adopting a Healthy Lifestyle.  Know what a healthy weight is for you (roughly BMI <25) and aim to maintain this   Aim for 7+ servings of fruits and vegetables daily   65-80+ fluid ounces of water or unsweet tea for healthy kidneys   Limit to max 1 drink of alcohol per day; avoid smoking/tobacco   Limit animal fats in diet for cholesterol and heart health - choose grass fed whenever available   Avoid highly processed foods, and foods high in saturated/trans fats   Aim for low stress - take time to unwind and care for your mental health   Aim for 150 min of moderate intensity exercise weekly for heart health, and weights twice weekly for bone health   Aim for 7-9 hours of sleep daily   When it comes to diets, agreement about the perfect plan isnt easy to find, even among the experts. Experts at the [facility] of [employer] developed an idea known as the Healthy Eating Plate. Just imagine a plate divided into logical, healthy portions.   The emphasis is on diet quality:   Load up on vegetables and fruits - one-half of your plate: Aim for color and variety, and remember that potatoes dont count.   Go for whole grains - one-quarter of your plate: Whole wheat, barley, wheat berries, quinoa, oats, brown rice, and foods made with them. If you want pasta, go with whole  wheat pasta.   Protein power - one-quarter of your plate: Fish, chicken, beans, and nuts are all healthy, versatile protein sources. Limit red meat.   The diet, however, does go beyond the plate, offering a few other suggestions.   Use healthy plant oils, such as olive, canola, soy, corn, sunflower and peanut. Check the labels, and avoid partially hydrogenated oil, which have unhealthy trans fats.   If youre thirsty, drink water.  Coffee and tea are good in moderation, but skip sugary drinks and limit milk and dairy products to one or two daily servings.   The type of carbohydrate in the diet is more important than the amount. Some sources of carbohydrates, such as vegetables, fruits, whole grains, and beans-are healthier than others.   Finally, stay active  Signed, Thomasene Ripple, DO  07/09/2019 9:55 AM    [facility]

## 2019-07-08 NOTE — Patient Instructions (Signed)
Medication Instructions:  Your physician recommends that you continue on your current medications as directed. Please refer to the Current Medication list given to you today.  *If you need a refill on your cardiac medications before your next appointment, please call your pharmacy*   Lab Work: Your physician recommends that you return for lab work in: TODAY BMP, Mag, CBC If you have labs (blood work) drawn today and your tests are completely normal, you will receive your results only by: . MyChart Message (if you have MyChart) OR . A paper copy in the mail If you have any lab test that is abnormal or we need to change your treatment, we will call you to review the results.   Testing/Procedures: None   Follow-Up: At [facility], you and your health needs are our priority.  As part of our continuing mission to provide you with exceptional heart care, we have created designated Provider Care Teams.  These Care Teams include your primary Cardiologist (physician) and Advanced Practice Providers (APPs -  Physician Assistants and Nurse Practitioners) who all work together to provide you with the care you need, when you need it.  We recommend signing up for the patient portal called "MyChart".  Sign up information is provided on this After Visit Summary.  MyChart is used to connect with patients for Virtual Visits (Telemedicine).  Patients are able to view lab/test results, encounter notes, upcoming appointments, etc.  Non-urgent messages can be sent to your provider as well.   To learn more about what you can do with MyChart, go to [email or web address].com.    Your next appointment:   3 month(s)  The format for your next appointment:   In Person  Provider:   Kardie Tobb, DO   Other Instructions   

## 2019-07-09 DIAGNOSIS — I2510 Atherosclerotic heart disease of native coronary artery without angina pectoris: Secondary | ICD-10-CM

## 2019-07-09 HISTORY — DX: Atherosclerotic heart disease of native coronary artery without angina pectoris: I25.10

## 2019-07-09 LAB — CBC
Hematocrit: 36 % — ABNORMAL LOW (ref 37.5–51.0)
Hemoglobin: 12.2 g/dL — ABNORMAL LOW (ref 13.0–17.7)
MCH: 28.4 pg (ref 26.6–33.0)
MCHC: 33.9 g/dL (ref 31.5–35.7)
MCV: 84 fL (ref 79–97)
Platelets: 389 10*3/uL (ref 150–450)
RBC: 4.29 x10E6/uL (ref 4.14–5.80)
RDW: 23.1 % — ABNORMAL HIGH (ref 11.6–15.4)
WBC: 13.1 10*3/uL — ABNORMAL HIGH (ref 3.4–10.8)

## 2019-07-09 LAB — BASIC METABOLIC PANEL
BUN/Creatinine Ratio: 24 (ref 10–24)
BUN: 24 mg/dL (ref 8–27)
CO2: 21 mmol/L (ref 20–29)
Calcium: 10.1 mg/dL (ref 8.6–10.2)
Chloride: 100 mmol/L (ref 96–106)
Creatinine, Ser: 0.98 mg/dL (ref 0.76–1.27)
GFR calc Af Amer: 90 mL/min/{1.73_m2} (ref >59)
GFR calc non Af Amer: 78 mL/min/{1.73_m2} (ref >59)
Glucose: 158 mg/dL — ABNORMAL HIGH (ref 65–99)
Potassium: 4.6 mmol/L (ref 3.5–5.2)
Sodium: 137 mmol/L (ref 134–144)

## 2019-07-09 LAB — MAGNESIUM: Magnesium: 1.9 mg/dL (ref 1.6–2.3)

## 2019-07-11 ENCOUNTER — Telehealth: Payer: Self-pay | Admitting: *Deleted

## 2019-07-11 ENCOUNTER — Telehealth: Payer: Self-pay

## 2019-07-11 MED ORDER — METOPROLOL SUCCINATE ER 25 MG PO TB24: 12.5000 mg | ORAL_TABLET | Freq: Every day | ORAL | 3 refills | Status: DC

## 2019-07-11 NOTE — Telephone Encounter (Signed)
Spoke with patient regarding results and recommendation.  Patient verbalizes understanding and is agreeable to plan of care. Advised patient to call back with any issues or concerns.  

## 2019-07-11 NOTE — Telephone Encounter (Signed)
*  STAT* If patient is at the pharmacy, call can be transferred to refill team.   1. Which medications need to be refilled? (please list name of each medication and dose if known) Metoprolol Succinate ER 25 mg  2. Which pharmacy/location (including street and city if local pharmacy) is medication to be sent to? [facility]   3. Do they need a 30 day or 90 day supply? [place]

## 2019-07-11 NOTE — Telephone Encounter (Signed)
Refill sent in per request.  

## 2019-07-11 NOTE — Telephone Encounter (Signed)
-----   Message from Berniece Salines, DO sent at 07/09/2019 10:33 AM EDT ----- Hemoglobin is stable, that's good news. He still needs to see GI. Blood glucose better than previous.

## 2019-07-12 DIAGNOSIS — B9689 Other specified bacterial agents as the cause of diseases classified elsewhere: Secondary | ICD-10-CM | POA: Diagnosis not present

## 2019-07-12 DIAGNOSIS — J019 Acute sinusitis, unspecified: Secondary | ICD-10-CM | POA: Diagnosis not present

## 2019-07-19 ENCOUNTER — Telehealth: Payer: Self-pay

## 2019-07-19 MED ORDER — METOPROLOL SUCCINATE ER 25 MG PO TB24: 12.5000 mg | ORAL_TABLET | Freq: Every day | ORAL | 3 refills | Status: DC

## 2019-07-19 NOTE — Telephone Encounter (Signed)
Refill sent to [facility] for Metoprolol 25 mg.

## 2019-07-27 ENCOUNTER — Telehealth: Payer: Self-pay | Admitting: *Deleted

## 2019-07-27 DIAGNOSIS — M109 Gout, unspecified: Secondary | ICD-10-CM | POA: Diagnosis not present

## 2019-07-27 NOTE — Telephone Encounter (Signed)
Pt has questions about insulin for colonoscopy- I reviewed diabetic prep instructions with pt and niece- understanding voiced

## 2019-07-28 ENCOUNTER — Telehealth: Payer: Self-pay | Admitting: Gastroenterology

## 2019-07-28 NOTE — Telephone Encounter (Signed)
Spoke with pt and clarified diabetic meds again- Take 1/2 of Tresiba dose today, none tomorrow before coming in, no Novolog insulin tonight and none before coming in tomorrow, ok to take oral diabetic meds today- none tomorrow before coming in.  Encouraged pt to drink regular drinks, not low calorie or low sugar.  Understanding voiced

## 2019-07-29 ENCOUNTER — Encounter: Payer: Self-pay | Admitting: Gastroenterology

## 2019-07-29 ENCOUNTER — Ambulatory Visit (AMBULATORY_SURGERY_CENTER): Payer: Medicare HMO | Admitting: Gastroenterology

## 2019-07-29 ENCOUNTER — Other Ambulatory Visit: Payer: Self-pay

## 2019-07-29 VITALS — BP 142/85 | HR 68 | Temp 97.3°F | Resp 14 | Ht 70.0 in | Wt 267.0 lb

## 2019-07-29 DIAGNOSIS — K319 Disease of stomach and duodenum, unspecified: Secondary | ICD-10-CM

## 2019-07-29 DIAGNOSIS — K5520 Angiodysplasia of colon without hemorrhage: Secondary | ICD-10-CM

## 2019-07-29 DIAGNOSIS — K5730 Diverticulosis of large intestine without perforation or abscess without bleeding: Secondary | ICD-10-CM

## 2019-07-29 DIAGNOSIS — D509 Iron deficiency anemia, unspecified: Secondary | ICD-10-CM

## 2019-07-29 DIAGNOSIS — K649 Unspecified hemorrhoids: Secondary | ICD-10-CM | POA: Diagnosis not present

## 2019-07-29 DIAGNOSIS — K2950 Unspecified chronic gastritis without bleeding: Secondary | ICD-10-CM | POA: Diagnosis not present

## 2019-07-29 DIAGNOSIS — E119 Type 2 diabetes mellitus without complications: Secondary | ICD-10-CM | POA: Diagnosis not present

## 2019-07-29 DIAGNOSIS — I2510 Atherosclerotic heart disease of native coronary artery without angina pectoris: Secondary | ICD-10-CM | POA: Diagnosis not present

## 2019-07-29 DIAGNOSIS — K269 Duodenal ulcer, unspecified as acute or chronic, without hemorrhage or perforation: Secondary | ICD-10-CM | POA: Diagnosis not present

## 2019-07-29 DIAGNOSIS — D125 Benign neoplasm of sigmoid colon: Secondary | ICD-10-CM | POA: Diagnosis not present

## 2019-07-29 DIAGNOSIS — K625 Hemorrhage of anus and rectum: Secondary | ICD-10-CM | POA: Diagnosis not present

## 2019-07-29 DIAGNOSIS — K2970 Gastritis, unspecified, without bleeding: Secondary | ICD-10-CM | POA: Diagnosis not present

## 2019-07-29 DIAGNOSIS — K31819 Angiodysplasia of stomach and duodenum without bleeding: Secondary | ICD-10-CM | POA: Diagnosis not present

## 2019-07-29 DIAGNOSIS — K219 Gastro-esophageal reflux disease without esophagitis: Secondary | ICD-10-CM | POA: Diagnosis not present

## 2019-07-29 MED ORDER — SODIUM CHLORIDE 0.9 % IV SOLN
500.0000 mL | Freq: Once | INTRAVENOUS | Status: DC
Start: 2019-07-29 — End: 2019-07-29

## 2019-07-29 MED ORDER — PANTOPRAZOLE SODIUM 40 MG PO TBEC: 40.0000 mg | DELAYED_RELEASE_TABLET | Freq: Every day | ORAL | 3 refills | Status: DC

## 2019-07-29 MED ORDER — PANTOPRAZOLE SODIUM 40 MG PO TBEC
40.0000 mg | DELAYED_RELEASE_TABLET | Freq: Two times a day (BID) | ORAL | 3 refills | Status: DC
Start: 2019-07-29 — End: 2019-10-06

## 2019-07-29 NOTE — Op Note (Signed)
[facility] Patient Name: Kayston Burkart Procedure Date: 07/29/2019 11:07 AM MRN: [id number] Endoscopist: Lynann Bologna , MD Age: 69 Referring MD:  Date of Birth: [birth year] Gender: Male Account #: [id number] Procedure:                Colonoscopy Indications:              Rectal bleeding with H/O IDA Medicines:                Monitored Anesthesia Care Procedure:                Pre-Anesthesia Assessment:                           - Prior to the procedure, a History and Physical                            was performed, and patient medications and                            allergies were reviewed. The patient's tolerance of                            previous anesthesia was also reviewed. The risks                            and benefits of the procedure and the sedation                            options and risks were discussed with the patient.                            All questions were answered, and informed consent                            was obtained. Prior Anticoagulants: The patient has                            taken no previous anticoagulant or antiplatelet                            agents. ASA Grade Assessment: II - A patient with                            mild systemic disease. After reviewing the risks                            and benefits, the patient was deemed in                            satisfactory condition to undergo the procedure.                           After obtaining informed consent, the colonoscope  was passed under direct vision. Throughout the                            procedure, the patient's blood pressure, pulse, and                            oxygen saturations were monitored continuously. The                            Colonoscope was introduced through the anus and                            advanced to the the cecum, identified by                            appendiceal orifice and ileocecal valve. The                             colonoscopy was technically difficult and complex                            due to fair bowel prep. Successful completion of                            the procedure was aided by lavage. The patient                            tolerated the procedure well. The quality of prep                            was adequate to identify polyps. The quality of                            preparation in the right side of the colon and in                            some areas of the transverse colon was somewhat                            poor with adherent stool. This was despite of                            aggressive suctioning and aspiration. Overall over                            80 to 85% of the colonic mucosa was visualized                            satisfactorily. Scope In: 11:21:20 AM Scope Out: 11:39:39 AM Scope Withdrawal Time: 0 hours 11 minutes 20 seconds  Total Procedure Duration: 0 hours 18 minutes 19 seconds  Findings:                 A 4  mm polyp was found in the distal sigmoid colon.                            The polyp was sessile. The polyp was removed with a                            cold biopsy forceps. Resection and retrieval were                            complete. Incidental note was made of a 1 cm                            yellowish lipoma with positive pillow sign and                            hepatic flexure.                           2 small small-mouthed diverticula were found in the                            sigmoid colon.                           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                           Non-bleeding external and internal hemorrhoids were                            found during retroflexion and rectal examination.                            The hemorrhoids  were small. Complications:            No immediate complications. Estimated Blood Loss:     Estimated blood loss: none. Impression:               - One 4 mm polyp in the distal sigmoid colon,                            removed with a cold biopsy forceps. Resected and                            retrieved.                           - Minimal sigmoid diverticulosis.                           -  2 small AVMs.                           - Non-bleeding external and internal hemorrhoids. Recommendation:           - Patient has a contact number available for                            emergencies. The signs and symptoms of potential                            delayed complications were discussed with the                            patient. Return to normal activities tomorrow.                            Written discharge instructions were provided to the                            patient.                           - Resume previous diet.                           - Continue present medications.                           - Await pathology results.                           - Trend CBC.                           - If he is anemic and requiring blood transfusions                            in the future, would recommend colonoscopy at [facility]                            after 2-day preparation.                           - Discussed with the patient and patient's family.                           - Follow-up in the GI clinic in 4 to 6 weeks. Lynann Bologna, MD 07/29/2019 11:56:15 AM This report has been signed electronically.

## 2019-07-29 NOTE — Progress Notes (Signed)
Called to room to assist during endoscopic procedure.  Patient ID and intended procedure confirmed with present staff. Received instructions for my participation in the procedure from the performing physician.  

## 2019-07-29 NOTE — Patient Instructions (Signed)
YOU HAD AN ENDOSCOPIC PROCEDURE TODAY AT THE [facility]:   Refer to the procedure report that was given to you for any specific questions about what was found during the examination.  If the procedure report does not answer your questions, please call your gastroenterologist to clarify.  If you requested that your care partner not be given the details of your procedure findings, then the procedure report has been included in a sealed envelope for you to review at your convenience later.  YOU SHOULD EXPECT: Some feelings of bloating in the abdomen. Passage of more gas than usual.  Walking can help get rid of the air that was put into your GI tract during the procedure and reduce the bloating. If you had a lower endoscopy (such as a colonoscopy or flexible sigmoidoscopy) you may notice spotting of blood in your stool or on the toilet paper. If you underwent a bowel prep for your procedure, you may not have a normal bowel movement for a few days.  Please Note:  You might notice some irritation and congestion in your nose or some drainage.  This is from the oxygen used during your procedure.  There is no need for concern and it should clear up in a day or so.  SYMPTOMS TO REPORT IMMEDIATELY:   Following lower endoscopy (colonoscopy or flexible sigmoidoscopy):  Excessive amounts of blood in the stool  Significant tenderness or worsening of abdominal pains  Swelling of the abdomen that is new, acute  Fever of 100F or higher   Following upper endoscopy (EGD)  Vomiting of blood or coffee ground material  New chest pain or pain under the shoulder blades  Painful or persistently difficult swallowing  New shortness of breath  Fever of 100F or higher  Black, tarry-looking stools  For urgent or emergent issues, a gastroenterologist can be reached at any hour by calling [phone]. Do not use MyChart messaging for urgent concerns.    DIET:  We do recommend a small meal at first, but  then you may proceed to your regular diet.  Drink plenty of fluids but you should avoid alcoholic beverages for 24 hours.  ACTIVITY:  You should plan to take it easy for the rest of today and you should NOT DRIVE or use heavy machinery until tomorrow (because of the sedation medicines used during the test).    FOLLOW UP: Our staff will call the number listed on your records 48-72 hours following your procedure to check on you and address any questions or concerns that you may have regarding the information given to you following your procedure. If we do not reach you, we will leave a message.  We will attempt to reach you two times.  During this call, we will ask if you have developed any symptoms of COVID 19. If you develop any symptoms (ie: fever, flu-like symptoms, shortness of breath, cough etc.) before then, please call [phone].  If you test positive for Covid 19 in the 2 weeks post procedure, please call and report this information to us.    If any biopsies were taken you will be contacted by phone or by letter within the next 1-3 weeks.  Please call us at [phone] if you have not heard about the biopsies in 3 weeks.    SIGNATURES/CONFIDENTIALITY: You and/or your care partner have signed paperwork which will be entered into your electronic medical record.  These signatures attest to the fact that that the information above on   your After Visit Summary has been reviewed and is understood.  Full responsibility of the confidentiality of this discharge information lies with you and/or your care-partner. 

## 2019-07-29 NOTE — Progress Notes (Signed)
Report to PACU, RN, vss, BBS= Clear.  

## 2019-07-29 NOTE — Op Note (Addendum)
[facility] Patient Name: [name] Williams Procedure Date: 07/29/2019 11:07 AM MRN: [id number] Endoscopist: Lynann Bologna , MD Age: 69 Referring MD:  Date of Birth: December 04, 1950 Gender: Male Account #: [id number] Procedure:                Upper GI endoscopy Indications:              Unexplained iron deficiency anemia Medicines:                Monitored Anesthesia Care Procedure:                Pre-Anesthesia Assessment:                           - Prior to the procedure, a History and Physical                            was performed, and patient medications and                            allergies were reviewed. The patient's tolerance of                            previous anesthesia was also reviewed. The risks                            and benefits of the procedure and the sedation                            options and risks were discussed with the patient.                            All questions were answered, and informed consent                            was obtained. Prior Anticoagulants: The patient has                            taken no previous anticoagulant or antiplatelet                            agents. ASA Grade Assessment: II - A patient with                            mild systemic disease. After reviewing the risks                            and benefits, the patient was deemed in                            satisfactory condition to undergo the procedure.                           After obtaining informed consent, the endoscope was  passed under direct vision. Throughout the                            procedure, the patient's blood pressure, pulse, and                            oxygen saturations were monitored continuously. The                            Endoscope was introduced through the mouth, and                            advanced to the second part of duodenum. The upper                            GI endoscopy was  accomplished without difficulty.                            The patient tolerated the procedure well. Scope In: Scope Out: Findings:                 The examined esophagus was normal with well-defined                            Z-line at 34 cm.                           Segmental moderate inflammation characterized by                            erosions and erythema was found in the gastric                            antrum. Biopsies were taken with a cold forceps for                            histology. 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                           A single 4 mm angiodysplastic lesion with no                            bleeding was found in the gastric body. (Not                            ablated since APC not available in [facility])                           Many (8-10) non-bleeding superficial duodenal                            ulcers with no stigmata of bleeding were found in  the duodenal bulb and in the first portion of the                            duodenum. These measured between 4-8 mm in largest                            dimension.                           The second portion of the duodenum was normal.                            Biopsies for histology were taken with a cold                            forceps for evaluation of celiac disease. Estimated                            blood loss: none. Complications:            No immediate complications. Estimated Blood Loss:     Estimated blood loss: none. Impression:               - Moderate gastritis.                           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                           - Multiple duodenal ulcers. Recommendation:           - Patient has a contact number available for                            emergencies. The signs and symptoms of potential                            delayed complications were discussed with the                             patient. Return to normal activities tomorrow.                            Written discharge instructions were provided to the                            patient.                           - Resume previous diet.                           - Continue present medications.                           -  Follow-up biopsies.                           - Trend CBC.                           - Use Protonix (pantoprazole) 40 mg PO BID #60, 6                            refills.                           - No ibuprofen, naproxen, or other non-steroidal                            anti-inflammatory drugs including Mobic.                           - Proceed with colonoscopy.                           - If he continues to be anemic requiring blood                            transfusions, would recommend repeating EGD at [facility]                            with Lesle Reek, MD 07/29/2019 11:48:14 AM This report has been signed electronically.

## 2019-08-02 ENCOUNTER — Telehealth: Payer: Self-pay

## 2019-08-02 NOTE — Telephone Encounter (Signed)
First attempt follow up call to pt, mailbox full. 

## 2019-08-02 NOTE — Telephone Encounter (Signed)
°  Follow up Call-  Call back number 07/29/2019  Post procedure Call Back phone  # [phone]  Permission to leave phone message Yes  Some recent data might be hidden     Patient questions:  Do you have a fever, pain , or abdominal swelling? No. Pain Score  0 *  Have you tolerated food without any problems? Yes.    Have you been able to return to your normal activities? Yes.    Do you have any questions about your discharge instructions: Diet   No. Medications  No. Follow up visit  No.  Do you have questions or concerns about your Care? No.  Actions: * If pain score is 4 or above: 1. No action needed, pain <4.Have you developed a fever since your procedure? no  2.   Have you had an respiratory symptoms (SOB or cough) since your procedure? no  3.   Have you tested positive for COVID 19 since your procedure no  4.   Have you had any family members/close contacts diagnosed with the COVID 19 since your procedure?  no   If yes to any of these questions please route to Joylene John, RN and Erenest Rasher, RN

## 2019-08-21 ENCOUNTER — Encounter: Payer: Self-pay | Admitting: Gastroenterology

## 2019-08-30 ENCOUNTER — Other Ambulatory Visit: Payer: Self-pay

## 2019-08-30 MED ORDER — FERROUS SULFATE 325 (65 FE) MG PO TABS: 325.0000 mg | ORAL_TABLET | ORAL | 2 refills | Status: DC

## 2019-08-30 NOTE — Telephone Encounter (Signed)
Rx refill sent to [facility] for Ferrous Sulfate.

## 2019-09-22 DIAGNOSIS — J329 Chronic sinusitis, unspecified: Secondary | ICD-10-CM | POA: Diagnosis not present

## 2019-09-22 DIAGNOSIS — E1142 Type 2 diabetes mellitus with diabetic polyneuropathy: Secondary | ICD-10-CM | POA: Diagnosis not present

## 2019-09-22 DIAGNOSIS — E1165 Type 2 diabetes mellitus with hyperglycemia: Secondary | ICD-10-CM | POA: Diagnosis not present

## 2019-09-27 DIAGNOSIS — J342 Deviated nasal septum: Secondary | ICD-10-CM | POA: Diagnosis not present

## 2019-09-27 DIAGNOSIS — J3489 Other specified disorders of nose and nasal sinuses: Secondary | ICD-10-CM | POA: Diagnosis not present

## 2019-09-27 DIAGNOSIS — J343 Hypertrophy of nasal turbinates: Secondary | ICD-10-CM | POA: Diagnosis not present

## 2019-09-27 DIAGNOSIS — J329 Chronic sinusitis, unspecified: Secondary | ICD-10-CM | POA: Diagnosis not present

## 2019-09-27 DIAGNOSIS — R0981 Nasal congestion: Secondary | ICD-10-CM | POA: Diagnosis not present

## 2019-10-03 DIAGNOSIS — J3489 Other specified disorders of nose and nasal sinuses: Secondary | ICD-10-CM | POA: Diagnosis not present

## 2019-10-03 DIAGNOSIS — J321 Chronic frontal sinusitis: Secondary | ICD-10-CM | POA: Diagnosis not present

## 2019-10-03 DIAGNOSIS — J320 Chronic maxillary sinusitis: Secondary | ICD-10-CM | POA: Diagnosis not present

## 2019-10-03 DIAGNOSIS — J329 Chronic sinusitis, unspecified: Secondary | ICD-10-CM | POA: Diagnosis not present

## 2019-10-03 DIAGNOSIS — J323 Chronic sphenoidal sinusitis: Secondary | ICD-10-CM | POA: Diagnosis not present

## 2019-10-06 ENCOUNTER — Other Ambulatory Visit: Payer: Self-pay

## 2019-10-06 ENCOUNTER — Ambulatory Visit (INDEPENDENT_AMBULATORY_CARE_PROVIDER_SITE_OTHER): Payer: Medicare HMO | Admitting: Cardiology

## 2019-10-06 ENCOUNTER — Encounter: Payer: Self-pay | Admitting: Cardiology

## 2019-10-06 VITALS — BP 146/86 | HR 82 | Ht 71.0 in | Wt 276.6 lb

## 2019-10-06 DIAGNOSIS — Z720 Tobacco use: Secondary | ICD-10-CM

## 2019-10-06 DIAGNOSIS — E782 Mixed hyperlipidemia: Secondary | ICD-10-CM | POA: Diagnosis not present

## 2019-10-06 DIAGNOSIS — D500 Iron deficiency anemia secondary to blood loss (chronic): Secondary | ICD-10-CM

## 2019-10-06 DIAGNOSIS — Z79899 Other long term (current) drug therapy: Secondary | ICD-10-CM | POA: Diagnosis not present

## 2019-10-06 DIAGNOSIS — I10 Essential (primary) hypertension: Secondary | ICD-10-CM

## 2019-10-06 DIAGNOSIS — I2510 Atherosclerotic heart disease of native coronary artery without angina pectoris: Secondary | ICD-10-CM | POA: Diagnosis not present

## 2019-10-06 DIAGNOSIS — R931 Abnormal findings on diagnostic imaging of heart and coronary circulation: Secondary | ICD-10-CM | POA: Diagnosis not present

## 2019-10-06 MED ORDER — PANTOPRAZOLE SODIUM 40 MG PO TBEC: 40.0000 mg | DELAYED_RELEASE_TABLET | Freq: Every day | ORAL | 2 refills | Status: DC

## 2019-10-06 MED ORDER — FUROSEMIDE 40 MG PO TABS
40.0000 mg | ORAL_TABLET | Freq: Every day | ORAL | 3 refills | Status: DC
Start: 2019-10-06 — End: 2019-10-13

## 2019-10-06 NOTE — Progress Notes (Signed)
Cardiology Office Note:    Date:  10/06/2019   ID:  [name] Williams, DOB February 24, 1950, MRN [id number]  PCP:  [name] Fusi, MD  Cardiologist:  [name] Ripple, DO  Electrophysiologist:  None   Referring MD: [name] Fusi, MD   I am still short of breath when I walk upstairs  History of Present Illness:    [name] Hornick [name] a 69 y.o.malewith a hx of diabetes mellitus, hypertension, obesity, hyperlipidemia, coronary artery disease by coronary CTA and abnormal FFR CT. The patient is planning cardiac catheterization he was noted to have a hemoglobin of 6.8and he was sent to the hospital at which time he got 2 packed cells of red blood cells.   I saw the patient 06/08/2019 at that time he was supposed to see GI. In there interim he was suppose to see GI- Dr [name] but called and reported that he would rather see someone in [name]. He then saw Dr. Charm [place] in [name] but he was referred back to Dr. Chales Williams for her endoscopy.  I did see the patient on July 08, 2019 at that time he had a scheduled procedure with Dr. Chales Williams on June 25.  He was able to get his colonoscopy with Dr. Chales Williams showed evidence of 2 small AVMs and diverticulosis.  He tells me he still is experiencing shortness of breath exertion.  Test especially if you walk up a hill or is doing activity that is exertional for a while.  He does not have any true chest pain.  I do believe his shortness of breath is his anginal symptom.  Past Medical History:  Diagnosis Date  . Anemia 05/18/2019  . Anxiety disorder   . Blood transfusion without reported diagnosis   . BPH (benign prostatic hyperplasia)   . Cataract   . Colon polyps   . DDD (degenerative disc disease), cervical   . Depression   . Dyslipidemia 05/29/2017  . Essential hypertension 05/29/2017  . Generalized osteoarthritis of multiple sites   . Gout   . Hyperlipidemia   . Hypertension, benign   . Laryngopharyngeal reflux   . Morbid obesity (HCC) 11/26/2018  . Shortness of  breath 07/17/2017  . Sinus arrhythmia 11/26/2018  . Swelling 05/29/2017  . Tobacco abuse   . Type 2 diabetes mellitus without complication, without long-term current use of insulin (HCC) 05/29/2017  . Uncontrolled type 2 diabetes mellitus with peripheral neuropathy [facility])     Past Surgical History:  Procedure Laterality Date  . HERNIA REPAIR  1998   Abdominal  . MINOR HEMORRHOIDECTOMY  1998  . NASAL SINUS SURGERY  1978  . SHOULDER SURGERY Left    X2  . TONSILLECTOMY  1967    Current Medications: Current Meds  Medication Sig  . allopurinol (ZYLOPRIM) 300 MG tablet Take 300 mg by mouth daily.  Marland Kitchen amLODipine (NORVASC) 5 MG tablet Take 5 mg by mouth daily.   Marland Kitchen atorvastatin (LIPITOR) 80 MG tablet Take 80 mg by mouth at bedtime.   . benzonatate (TESSALON) 200 MG capsule Take 200 mg by mouth daily.  . cetirizine (ZYRTEC) 10 MG tablet Take 10 mg by mouth at bedtime.   . citalopram (CELEXA) 20 MG tablet Take 20 mg by mouth daily.  . ferrous sulfate 325 (65 FE) MG tablet Take 1 tablet (325 mg total) by mouth every other day.  Marland Kitchen gemfibrozil (LOPID) 600 MG tablet Take 600 mg by mouth 2 (two) times daily before a meal.  . glimepiride (AMARYL) 4 MG  tablet Take 4 mg by mouth 2 (two) times daily.  Marland Kitchen losartan (COZAAR) 100 MG tablet Take 100 mg by mouth at bedtime.   . meloxicam (MOBIC) 7.5 MG tablet Take 7.5 mg by mouth 2 (two) times daily.  . metFORMIN (GLUCOPHAGE) 1000 MG tablet Take 1,000 mg by mouth 2 (two) times daily with a meal.  . metoprolol succinate (TOPROL XL) 25 MG 24 hr tablet Take 0.5 tablets (12.5 mg total) by mouth daily.  . montelukast (SINGULAIR) 10 MG tablet Take 10 mg by mouth daily.   . naphazoline-pheniramine (NAPHCON-A) 0.025-0.3 % ophthalmic solution Place 1 drop into both eyes 3 (three) times daily as needed for eye irritation (dry/irritated/allergy eyes.).   Marland Kitchen nitroGLYCERIN (NITROSTAT) 0.4 MG SL tablet Place 1 tablet (0.4 mg total) under the tongue every 5 (five) minutes as  needed for chest pain.  Marland Kitchen NOVOLOG FLEXPEN 100 UNIT/ML FlexPen Inject 20 Units into the skin in the morning, at noon, and at bedtime.   . ondansetron (ZOFRAN) 4 MG tablet Take 1 tablet by mouth as needed.   . pantoprazole (PROTONIX) 40 MG tablet Take 1 tablet (40 mg total) by mouth daily.  . potassium chloride SA (KLOR-CON) 20 MEQ tablet Take 1 tablet (20 mEq total) by mouth daily.  . tamsulosin (FLOMAX) 0.4 MG CAPS capsule Take 0.4 mg by mouth daily.  Evaristo Bury FLEXTOUCH 100 UNIT/ML FlexTouch Pen Inject 60 Units into the skin at bedtime.      Allergies:   Pseudoephedrine, Penicillins, and Tetracyclines & related   Social History   Socioeconomic History  . Marital status: Married    Spouse name: Not on file  . Number of children: Not on file  . Years of education: Not on file  . Highest education level: Not on file  Occupational History  . Not on file  Tobacco Use  . Smoking status: Former Smoker    Quit date: 2015    Years since quitting: 6.6  . Smokeless tobacco: Former Neurosurgeon    Types: Chew  Substance and Sexual Activity  . Alcohol use: Not Currently  . Drug use: Never  . Sexual activity: Not on file  Other Topics Concern  . Not on file  Social History Narrative  . Not on file   Social Determinants of Health   Financial Resource Strain:   . Difficulty of Paying Living Expenses: Not on file  Food Insecurity:   . Worried About Programme researcher, broadcasting/film/video in the Last Year: Not on file  . Ran Out of Food in the Last Year: Not on file  Transportation Needs:   . Lack of Transportation (Medical): Not on file  . Lack of Transportation (Non-Medical): Not on file  Physical Activity:   . Days of Exercise per Week: Not on file  . Minutes of Exercise per Session: Not on file  Stress:   . Feeling of Stress : Not on file  Social Connections:   . Frequency of Communication with Friends and Family: Not on file  . Frequency of Social Gatherings with Friends and Family: Not on file  .  Attends Religious Services: Not on file  . Active Member of Clubs or Organizations: Not on file  . Attends Banker Meetings: Not on file  . Marital Status: Not on file     Family History: The patient's family history includes Alcohol abuse in his father; Diabetes in his brother; Lung cancer in his father; Stomach cancer in his maternal grandmother and paternal  grandmother; Stroke in his father. There is no history of Colon cancer, Esophageal cancer, or Rectal cancer.  ROS:   Review of Systems  Constitution: Negative for decreased appetite, fever and weight gain.  HENT: Negative for congestion, ear discharge, hoarse voice and sore throat.   Eyes: Negative for discharge, redness, vision loss in right eye and visual halos.  Cardiovascular: Negative for chest pain, dyspnea on exertion, leg swelling, orthopnea and palpitations.  Respiratory: Negative for cough, hemoptysis, shortness of breath and snoring.   Endocrine: Negative for heat intolerance and polyphagia.  Hematologic/Lymphatic: Negative for bleeding problem. Does not bruise/bleed easily.  Skin: Negative for flushing, nail changes, rash and suspicious lesions.  Musculoskeletal: Negative for arthritis, joint pain, muscle cramps, myalgias, neck pain and stiffness.  Gastrointestinal: Negative for abdominal pain, bowel incontinence, diarrhea and excessive appetite.  Genitourinary: Negative for decreased libido, genital sores and incomplete emptying.  Neurological: Negative for brief paralysis, focal weakness, headaches and loss of balance.  Psychiatric/Behavioral: Negative for altered mental status, depression and suicidal ideas.  Allergic/Immunologic: Negative for HIV exposure and persistent infections.    EKGs/Labs/Other Studies Reviewed:    The following studies were reviewed today:   EKG:  The ekg ordered today demonstrates   Colonoscopy June 25th 2021  0 hours 11 minutes 20 seconds  Total Procedure Duration: 0  hours 18 minutes 19 seconds  Findings:                 A 4 mm polyp was found in the distal sigmoid colon.                            The polyp was sessile. The polyp was removed with a                            cold biopsy forceps. Resection and retrieval were                            complete. Incidental note was made of a 1 cm                            yellowish lipoma with positive pillow sign and                            hepatic flexure.                           2 small small-mouthed diverticula were found in the                            sigmoid colon.                           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  Non-bleeding external and internal hemorrhoids were                            found during retroflexion and rectal examination.                            The hemorrhoids were small. Complications:            No immediate complications. Estimated Blood Loss:     Estimated blood loss: none. Impression:               - One 4 mm polyp in the distal sigmoid colon,                            removed with a cold biopsy forceps. Resected and                            retrieved.                           - Minimal sigmoid diverticulosis.                           - 2 small AVMs.                           - Non-bleeding external and internal hemorrhoids. Recommendation:           - Patient has a contact number available for                            emergencies. The signs and symptoms of potential                            delayed complications were discussed with the                            patient. Return to normal activities tomorrow.                            Written discharge instructions were provided to the                            patient.                           -  Resume previous diet.                           - Continue present medications.                           - Await pathology results.                           - Trend CBC.                           -  If he is anemic and requiring blood transfusions                            in the future, would recommend colonoscopy at [facility]                            after 2-day preparation.                           - Discussed with the patient and patient's family.                           - Follow-up in the GI clinic in 4 to 6 weeks  Recent Labs: 05/18/2019: ALT 16 07/08/2019: BUN 24; Creatinine, Ser 0.98; Hemoglobin 12.2; Magnesium 1.9; Platelets 389; Potassium 4.6; Sodium 137  Recent Lipid Panel No results found for: CHOL, TRIG, HDL, CHOLHDL, VLDL, LDLCALC, LDLDIRECT  Physical Exam:    VS:  BP (!) 146/86   Pulse 82   Ht 5\' 11"  (1.803 m)   Wt 276 lb 9.6 oz (125.5 kg)   SpO2 97%   BMI 38.58 kg/m     Wt Readings from Last 3 Encounters:  10/06/19 276 lb 9.6 oz (125.5 kg)  07/29/19 267 lb (121.1 kg)  07/08/19 263 lb 9.6 oz (119.6 kg)     GEN: Well nourished, well developed in no acute distress HEENT: Normal NECK: No JVD; No carotid bruits LYMPHATICS: No lymphadenopathy CARDIAC: S1S2 noted,RRR, no murmurs, rubs, gallops RESPIRATORY:  Clear to auscultation without rales, wheezing or rhonchi  ABDOMEN: Soft, non-tender, non-distended, +bowel sounds, no guarding. EXTREMITIES: No edema, No cyanosis, no clubbing MUSCULOSKELETAL:  No deformity  SKIN: Warm and dry NEUROLOGIC:  Alert and oriented x 3, non-focal PSYCHIATRIC:  Normal affect, good insight  ASSESSMENT:    1. Abnormal FFRCT   2. Medication management   3. Essential hypertension   4. Coronary artery disease involving native heart, angina presence unspecified, unspecified vessel or lesion type   5. Tobacco abuse   6. Mixed hyperlipidemia   7. Iron deficiency anemia due to chronic blood loss    PLAN:    He is still experiencing  shortness of breath.  At this point I have made contact to speak with Dr. Chales [place] to understand if is okay to move along with aspirin as well as pursuing cardiac catheterization for potential dual antiplatelet therapy if he requires stent.  His coronary CTA was abnormal as well as an abnormal FFR.  Based on my discussion with GI he is cleared to be started on dual antiplatelet therapy if he needed.  We will start the patient on aspirin 81 mg today.  And we will move forward with a heart catheterization given his symptoms and abnormal FFR.    The patient understands that risks include but are not limited to stroke (1 in 1000), death (1 in 1000), kidney failure [usually temporary] (1 in 500), bleeding (1 in 200), allergic reaction [possibly serious] (1 in 200), and agrees to proceed.  He does has bilateral leg edema he takes Lasix 20 mg daily and will increase this to 40 mg daily.  His diabetes is managed by his primary care doctor.  Blood work  be done today for BMP, CBC and mag.   The patient is in agreement with the above plan. The  patient left the office in stable condition.  The patient will follow up post cath  Medication Adjustments/Labs and Tests Ordered: Current medicines are reviewed at length with the patient today.  Concerns regarding medicines are outlined above.  Orders Placed This Encounter  Procedures  . CBC  . Basic metabolic panel  . Magnesium   Meds ordered this encounter  Medications  . furosemide (LASIX) 40 MG tablet    Sig: Take 1 tablet (40 mg total) by mouth daily.    Dispense:  90 tablet    Refill:  3    Patient Instructions  Medication Instructions:  Increase Lasix to 40 mg daily   *If you need a refill on your cardiac medications before your next appointment, please call your pharmacy*   Lab Work: Bmp, Magnesium, Cbc  If you have labs (blood work) drawn today and your tests are completely normal, you will receive your results only by: Marland Kitchen MyChart Message  (if you have MyChart) OR . A paper copy in the mail If you have any lab test that is abnormal or we need to change your treatment, we will call you to review the results.   Testing/Procedures: None ordered    Follow-Up: At [facility], you and your health needs are our priority.  As part of our continuing mission to provide you with exceptional heart care, we have created designated Provider Care Teams.  These Care Teams include your primary Cardiologist (physician) and Advanced Practice Providers (APPs -  Physician Assistants and Nurse Practitioners) who all work together to provide you with the care you need, when you need it.  We recommend signing up for the patient portal called "MyChart".  Sign up information is provided on this After Visit Summary.  MyChart is used to connect with patients for Virtual Visits (Telemedicine).  Patients are able to view lab/test results, encounter notes, upcoming appointments, etc.  Non-urgent messages can be sent to your provider as well.   To learn more about what you can do with MyChart, go to [email or web address].    Your next appointment:   3 month(s)  The format for your next appointment:   In Person  Provider:   Thomasene Ripple, DO   Other Instructions None      Adopting a Healthy Lifestyle.  Know what a healthy weight is for you (roughly BMI <25) and aim to maintain this   Aim for 7+ servings of fruits and vegetables daily   65-80+ fluid ounces of water or unsweet tea for healthy kidneys   Limit to max 1 drink of alcohol per day; avoid smoking/tobacco   Limit animal fats in diet for cholesterol and heart health - choose grass fed whenever available   Avoid highly processed foods, and foods high in saturated/trans fats   Aim for low stress - take time to unwind and care for your mental health   Aim for 150 min of moderate intensity exercise weekly for heart health, and weights twice weekly for bone health   Aim for 7-9  hours of sleep daily   When it comes to diets, agreement about the perfect plan isnt easy to find, even among the experts. Experts at the [facility] of [employer] developed an idea known as the Healthy Eating Plate. Just imagine a plate divided into logical, healthy portions.   The emphasis is on diet quality:   Load up on vegetables and fruits - one-half of your plate: Aim for color and variety, and remember that  potatoes dont count.   Go for whole grains - one-quarter of your plate: Whole wheat, barley, wheat berries, quinoa, oats, brown rice, and foods made with them. If you want pasta, go with whole wheat pasta.   Protein power - one-quarter of your plate: Fish, chicken, beans, and nuts are all healthy, versatile protein sources. Limit red meat.   The diet, however, does go beyond the plate, offering a few other suggestions.   Use healthy plant oils, such as olive, canola, soy, corn, sunflower and peanut. Check the labels, and avoid partially hydrogenated oil, which have unhealthy trans fats.   If youre thirsty, drink water. Coffee and tea are good in moderation, but skip sugary drinks and limit milk and dairy products to one or two daily servings.   The type of carbohydrate in the diet is more important than the amount. Some sources of carbohydrates, such as vegetables, fruits, whole grains, and beans-are healthier than others.   Finally, stay active  Signed, [name] Ripple, DO  10/06/2019 2:18 PM    [facility]

## 2019-10-06 NOTE — Patient Instructions (Addendum)
Medication Instructions:  Increase Lasix to 40 mg daily   *If you need a refill on your cardiac medications before your next appointment, please call your pharmacy*   Lab Work: Bmp, Magnesium, Cbc  If you have labs (blood work) drawn today and your tests are completely normal, you will receive your results only by: Marland Kitchen MyChart Message (if you have MyChart) OR . A paper copy in the mail If you have any lab test that is abnormal or we need to change your treatment, we will call you to review the results.   Testing/Procedures: Your Pre-procedure COVID-19 Testing will be done on 10/11/19 at 2:45 pm at [place] , [place], [id number] After your swab you will be given a mask to wear and instructed to go home and quarantine you are not allowed to have visitors until after your procedure. If you test positive you will be notified and your procedure will be cancelled.       Follow-Up: At [facility], you and your health needs are our priority.  As part of our continuing mission to provide you with exceptional heart care, we have created designated Provider Care Teams.  These Care Teams include your primary Cardiologist (physician) and Advanced Practice Providers (APPs -  Physician Assistants and Nurse Practitioners) who all work together to provide you with the care you need, when you need it.  We recommend signing up for the patient portal called "MyChart".  Sign up information is provided on this After Visit Summary.  MyChart is used to connect with patients for Virtual Visits (Telemedicine).  Patients are able to view lab/test results, encounter notes, upcoming appointments, etc.  Non-urgent messages can be sent to your provider as well.   To learn more about what you can do with MyChart, go to [email or web address].    Your next appointment:   3 month(s)  The format for your next appointment:   In Person  Provider:   Berniece Salines, DO   Other Instructions    [place] [address] Dept: [phone] Loc: [place]  10/06/2019  You are scheduled for a Cardiac Catheterization on Thursday, September 9 with Dr. Harrell Gave End.  1. Please arrive at the [facility] (Main Entrance A) at [facility]: [place], [place] [address] at 5:30 AM (This time is two hours before your procedure to ensure your preparation). Free valet parking service is available.   Special note: Every effort is made to have your procedure done on time. Please understand that emergencies sometimes delay scheduled procedures.  2. Diet: Do not eat solid foods after midnight.  The patient may have clear liquids until 5am upon the day of the procedure.  3. Medication instructions in preparation for your procedure:   Do not take Diabetes Med Glucophage (Metformin) on the day of the procedure and HOLD 48 HOURS AFTER THE PROCEDURE.   Hold your Glimepiride the morning of your procedure   Hold Lasix the morning of your procedure   On the morning of your procedure, take your Aspirin and any morning medicines NOT listed above.  You may use sips of water.  5. Plan for one night stay--bring personal belongings. 6. Bring a current list of your medications and current insurance cards. 7. You MUST have a responsible person to drive you home. 8. Someone MUST be with you the first 24 hours  after you arrive home or your discharge will be delayed. 9. Please wear clothes that are easy to get on and off and wear slip-on shoes.  Thank you for allowing [place] to care for you!   -- [facility]

## 2019-10-06 NOTE — Telephone Encounter (Signed)
Refill sent to [facility] for Pantoprazole.

## 2019-10-06 NOTE — H&P (View-Only) (Signed)
Cardiology Office Note:    Date:  10/06/2019   ID:  [name] Williams, DOB February 24, 1950, MRN [id number]  PCP:  Paulina Fusi, MD  Cardiologist:  Thomasene Ripple, DO  Electrophysiologist:  None   Referring MD: Paulina Fusi, MD   I am still short of breath when I walk upstairs  History of Present Illness:    [name] Hornick [name] a 69 y.o.malewith a hx of diabetes mellitus, hypertension, obesity, hyperlipidemia, coronary artery disease by coronary CTA and abnormal FFR CT. The patient is planning cardiac catheterization he was noted to have a hemoglobin of 6.8and he was sent to the hospital at which time he got 2 packed cells of red blood cells.   I saw the patient 06/08/2019 at that time he was supposed to see GI. In there interim he was suppose to see GI- Dr Chales Abrahams but called and reported that he would rather see someone in [place]. He then saw Dr. Charm Barges in [place] but he was referred back to Dr. Chales Abrahams for her endoscopy.  I did see the patient on July 08, 2019 at that time he had a scheduled procedure with Dr. Chales Abrahams on June 25.  He was able to get his colonoscopy with Dr. Chales Abrahams showed evidence of 2 small AVMs and diverticulosis.  He tells me he still is experiencing shortness of breath exertion.  Test especially if you walk up a hill or is doing activity that is exertional for a while.  He does not have any true chest pain.  I do believe his shortness of breath is his anginal symptom.  Past Medical History:  Diagnosis Date  . Anemia 05/18/2019  . Anxiety disorder   . Blood transfusion without reported diagnosis   . BPH (benign prostatic hyperplasia)   . Cataract   . Colon polyps   . DDD (degenerative disc disease), cervical   . Depression   . Dyslipidemia 05/29/2017  . Essential hypertension 05/29/2017  . Generalized osteoarthritis of multiple sites   . Gout   . Hyperlipidemia   . Hypertension, benign   . Laryngopharyngeal reflux   . Morbid obesity (HCC) 11/26/2018  . Shortness of  breath 07/17/2017  . Sinus arrhythmia 11/26/2018  . Swelling 05/29/2017  . Tobacco abuse   . Type 2 diabetes mellitus without complication, without long-term current use of insulin (HCC) 05/29/2017  . Uncontrolled type 2 diabetes mellitus with peripheral neuropathy [facility])     Past Surgical History:  Procedure Laterality Date  . HERNIA REPAIR  1998   Abdominal  . MINOR HEMORRHOIDECTOMY  1998  . NASAL SINUS SURGERY  1978  . SHOULDER SURGERY Left    X2  . TONSILLECTOMY  1967    Current Medications: Current Meds  Medication Sig  . allopurinol (ZYLOPRIM) 300 MG tablet Take 300 mg by mouth daily.  Marland Kitchen amLODipine (NORVASC) 5 MG tablet Take 5 mg by mouth daily.   Marland Kitchen atorvastatin (LIPITOR) 80 MG tablet Take 80 mg by mouth at bedtime.   . benzonatate (TESSALON) 200 MG capsule Take 200 mg by mouth daily.  . cetirizine (ZYRTEC) 10 MG tablet Take 10 mg by mouth at bedtime.   . citalopram (CELEXA) 20 MG tablet Take 20 mg by mouth daily.  . ferrous sulfate 325 (65 FE) MG tablet Take 1 tablet (325 mg total) by mouth every other day.  Marland Kitchen gemfibrozil (LOPID) 600 MG tablet Take 600 mg by mouth 2 (two) times daily before a meal.  . glimepiride (AMARYL) 4 MG  tablet Take 4 mg by mouth 2 (two) times daily.  Marland Kitchen losartan (COZAAR) 100 MG tablet Take 100 mg by mouth at bedtime.   . meloxicam (MOBIC) 7.5 MG tablet Take 7.5 mg by mouth 2 (two) times daily.  . metFORMIN (GLUCOPHAGE) 1000 MG tablet Take 1,000 mg by mouth 2 (two) times daily with a meal.  . metoprolol succinate (TOPROL XL) 25 MG 24 hr tablet Take 0.5 tablets (12.5 mg total) by mouth daily.  . montelukast (SINGULAIR) 10 MG tablet Take 10 mg by mouth daily.   . naphazoline-pheniramine (NAPHCON-A) 0.025-0.3 % ophthalmic solution Place 1 drop into both eyes 3 (three) times daily as needed for eye irritation (dry/irritated/allergy eyes.).   Marland Kitchen nitroGLYCERIN (NITROSTAT) 0.4 MG SL tablet Place 1 tablet (0.4 mg total) under the tongue every 5 (five) minutes as  needed for chest pain.  Marland Kitchen NOVOLOG FLEXPEN 100 UNIT/ML FlexPen Inject 20 Units into the skin in the morning, at noon, and at bedtime.   . ondansetron (ZOFRAN) 4 MG tablet Take 1 tablet by mouth as needed.   . pantoprazole (PROTONIX) 40 MG tablet Take 1 tablet (40 mg total) by mouth daily.  . potassium chloride SA (KLOR-CON) 20 MEQ tablet Take 1 tablet (20 mEq total) by mouth daily.  . tamsulosin (FLOMAX) 0.4 MG CAPS capsule Take 0.4 mg by mouth daily.  Evaristo Bury FLEXTOUCH 100 UNIT/ML FlexTouch Pen Inject 60 Units into the skin at bedtime.      Allergies:   Pseudoephedrine, Penicillins, and Tetracyclines & related   Social History   Socioeconomic History  . Marital status: Married    Spouse name: Not on file  . Number of children: Not on file  . Years of education: Not on file  . Highest education level: Not on file  Occupational History  . Not on file  Tobacco Use  . Smoking status: Former Smoker    Quit date: 2015    Years since quitting: 6.6  . Smokeless tobacco: Former Neurosurgeon    Types: Chew  Substance and Sexual Activity  . Alcohol use: Not Currently  . Drug use: Never  . Sexual activity: Not on file  Other Topics Concern  . Not on file  Social History Narrative  . Not on file   Social Determinants of Health   Financial Resource Strain:   . Difficulty of Paying Living Expenses: Not on file  Food Insecurity:   . Worried About Programme researcher, broadcasting/film/video in the Last Year: Not on file  . Ran Out of Food in the Last Year: Not on file  Transportation Needs:   . Lack of Transportation (Medical): Not on file  . Lack of Transportation (Non-Medical): Not on file  Physical Activity:   . Days of Exercise per Week: Not on file  . Minutes of Exercise per Session: Not on file  Stress:   . Feeling of Stress : Not on file  Social Connections:   . Frequency of Communication with Friends and Family: Not on file  . Frequency of Social Gatherings with Friends and Family: Not on file  .  Attends Religious Services: Not on file  . Active Member of Clubs or Organizations: Not on file  . Attends Banker Meetings: Not on file  . Marital Status: Not on file     Family History: The patient's family history includes Alcohol abuse in his father; Diabetes in his brother; Lung cancer in his father; Stomach cancer in his maternal grandmother and paternal  grandmother; Stroke in his father. There is no history of Colon cancer, Esophageal cancer, or Rectal cancer.  ROS:   Review of Systems  Constitution: Negative for decreased appetite, fever and weight gain.  HENT: Negative for congestion, ear discharge, hoarse voice and sore throat.   Eyes: Negative for discharge, redness, vision loss in right eye and visual halos.  Cardiovascular: Negative for chest pain, dyspnea on exertion, leg swelling, orthopnea and palpitations.  Respiratory: Negative for cough, hemoptysis, shortness of breath and snoring.   Endocrine: Negative for heat intolerance and polyphagia.  Hematologic/Lymphatic: Negative for bleeding problem. Does not bruise/bleed easily.  Skin: Negative for flushing, nail changes, rash and suspicious lesions.  Musculoskeletal: Negative for arthritis, joint pain, muscle cramps, myalgias, neck pain and stiffness.  Gastrointestinal: Negative for abdominal pain, bowel incontinence, diarrhea and excessive appetite.  Genitourinary: Negative for decreased libido, genital sores and incomplete emptying.  Neurological: Negative for brief paralysis, focal weakness, headaches and loss of balance.  Psychiatric/Behavioral: Negative for altered mental status, depression and suicidal ideas.  Allergic/Immunologic: Negative for HIV exposure and persistent infections.    EKGs/Labs/Other Studies Reviewed:    The following studies were reviewed today:   EKG:  The ekg ordered today demonstrates   Colonoscopy June 25th 2021  0 hours 11 minutes 20 seconds  Total Procedure Duration: 0  hours 18 minutes 19 seconds  Findings:                 A 4 mm polyp was found in the distal sigmoid colon.                            The polyp was sessile. The polyp was removed with a                            cold biopsy forceps. Resection and retrieval were                            complete. Incidental note was made of a 1 cm                            yellowish lipoma with positive pillow sign and                            hepatic flexure.                           2 small small-mouthed diverticula were found in the                            sigmoid colon.                           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  Non-bleeding external and internal hemorrhoids were                            found during retroflexion and rectal examination.                            The hemorrhoids were small. Complications:            No immediate complications. Estimated Blood Loss:     Estimated blood loss: none. Impression:               - One 4 mm polyp in the distal sigmoid colon,                            removed with a cold biopsy forceps. Resected and                            retrieved.                           - Minimal sigmoid diverticulosis.                           - 2 small AVMs.                           - Non-bleeding external and internal hemorrhoids. Recommendation:           - Patient has a contact number available for                            emergencies. The signs and symptoms of potential                            delayed complications were discussed with the                            patient. Return to normal activities tomorrow.                            Written discharge instructions were provided to the                            patient.                           -  Resume previous diet.                           - Continue present medications.                           - Await pathology results.                           - Trend CBC.                           -  If he is anemic and requiring blood transfusions                            in the future, would recommend colonoscopy at [facility]                            after 2-day preparation.                           - Discussed with the patient and patient's family.                           - Follow-up in the GI clinic in 4 to 6 weeks  Recent Labs: 05/18/2019: ALT 16 07/08/2019: BUN 24; Creatinine, Ser 0.98; Hemoglobin 12.2; Magnesium 1.9; Platelets 389; Potassium 4.6; Sodium 137  Recent Lipid Panel No results found for: CHOL, TRIG, HDL, CHOLHDL, VLDL, LDLCALC, LDLDIRECT  Physical Exam:    VS:  BP (!) 146/86   Pulse 82   Ht 5\' 11"  (1.803 m)   Wt 276 lb 9.6 oz (125.5 kg)   SpO2 97%   BMI 38.58 kg/m     Wt Readings from Last 3 Encounters:  10/06/19 276 lb 9.6 oz (125.5 kg)  07/29/19 267 lb (121.1 kg)  07/08/19 263 lb 9.6 oz (119.6 kg)     GEN: Well nourished, well developed in no acute distress HEENT: Normal NECK: No JVD; No carotid bruits LYMPHATICS: No lymphadenopathy CARDIAC: S1S2 noted,RRR, no murmurs, rubs, gallops RESPIRATORY:  Clear to auscultation without rales, wheezing or rhonchi  ABDOMEN: Soft, non-tender, non-distended, +bowel sounds, no guarding. EXTREMITIES: No edema, No cyanosis, no clubbing MUSCULOSKELETAL:  No deformity  SKIN: Warm and dry NEUROLOGIC:  Alert and oriented x 3, non-focal PSYCHIATRIC:  Normal affect, good insight  ASSESSMENT:    1. Abnormal FFRCT   2. Medication management   3. Essential hypertension   4. Coronary artery disease involving native heart, angina presence unspecified, unspecified vessel or lesion type   5. Tobacco abuse   6. Mixed hyperlipidemia   7. Iron deficiency anemia due to chronic blood loss    PLAN:    He is still experiencing  shortness of breath.  At this point I have made contact to speak with Dr. Chales Abrahams to understand if is okay to move along with aspirin as well as pursuing cardiac catheterization for potential dual antiplatelet therapy if he requires stent.  His coronary CTA was abnormal as well as an abnormal FFR.  Based on my discussion with GI he is cleared to be started on dual antiplatelet therapy if he needed.  We will start the patient on aspirin 81 mg today.  And we will move forward with a heart catheterization given his symptoms and abnormal FFR.    The patient understands that risks include but are not limited to stroke (1 in 1000), death (1 in 1000), kidney failure [usually temporary] (1 in 500), bleeding (1 in 200), allergic reaction [possibly serious] (1 in 200), and agrees to proceed.  He does has bilateral leg edema he takes Lasix 20 mg daily and will increase this to 40 mg daily.  His diabetes is managed by his primary care doctor.  Blood work  be done today for BMP, CBC and mag.   The patient is in agreement with the above plan. The  patient left the office in stable condition.  The patient will follow up post cath  Medication Adjustments/Labs and Tests Ordered: Current medicines are reviewed at length with the patient today.  Concerns regarding medicines are outlined above.  Orders Placed This Encounter  Procedures  . CBC  . Basic metabolic panel  . Magnesium   Meds ordered this encounter  Medications  . furosemide (LASIX) 40 MG tablet    Sig: Take 1 tablet (40 mg total) by mouth daily.    Dispense:  90 tablet    Refill:  3    Patient Instructions  Medication Instructions:  Increase Lasix to 40 mg daily   *If you need a refill on your cardiac medications before your next appointment, please call your pharmacy*   Lab Work: Bmp, Magnesium, Cbc  If you have labs (blood work) drawn today and your tests are completely normal, you will receive your results only by: Marland Kitchen MyChart Message  (if you have MyChart) OR . A paper copy in the mail If you have any lab test that is abnormal or we need to change your treatment, we will call you to review the results.   Testing/Procedures: None ordered    Follow-Up: At [facility], you and your health needs are our priority.  As part of our continuing mission to provide you with exceptional heart care, we have created designated Provider Care Teams.  These Care Teams include your primary Cardiologist (physician) and Advanced Practice Providers (APPs -  Physician Assistants and Nurse Practitioners) who all work together to provide you with the care you need, when you need it.  We recommend signing up for the patient portal called "MyChart".  Sign up information is provided on this After Visit Summary.  MyChart is used to connect with patients for Virtual Visits (Telemedicine).  Patients are able to view lab/test results, encounter notes, upcoming appointments, etc.  Non-urgent messages can be sent to your provider as well.   To learn more about what you can do with MyChart, go to [email or web address].    Your next appointment:   3 month(s)  The format for your next appointment:   In Person  Provider:   Thomasene Ripple, DO   Other Instructions None      Adopting a Healthy Lifestyle.  Know what a healthy weight is for you (roughly BMI <25) and aim to maintain this   Aim for 7+ servings of fruits and vegetables daily   65-80+ fluid ounces of water or unsweet tea for healthy kidneys   Limit to max 1 drink of alcohol per day; avoid smoking/tobacco   Limit animal fats in diet for cholesterol and heart health - choose grass fed whenever available   Avoid highly processed foods, and foods high in saturated/trans fats   Aim for low stress - take time to unwind and care for your mental health   Aim for 150 min of moderate intensity exercise weekly for heart health, and weights twice weekly for bone health   Aim for 7-9  hours of sleep daily   When it comes to diets, agreement about the perfect plan isnt easy to find, even among the experts. Experts at the [facility] of [employer] developed an idea known as the Healthy Eating Plate. Just imagine a plate divided into logical, healthy portions.   The emphasis is on diet quality:   Load up on vegetables and fruits - one-half of your plate: Aim for color and variety, and remember that  potatoes dont count.   Go for whole grains - one-quarter of your plate: Whole wheat, barley, wheat berries, quinoa, oats, brown rice, and foods made with them. If you want pasta, go with whole wheat pasta.   Protein power - one-quarter of your plate: Fish, chicken, beans, and nuts are all healthy, versatile protein sources. Limit red meat.   The diet, however, does go beyond the plate, offering a few other suggestions.   Use healthy plant oils, such as olive, canola, soy, corn, sunflower and peanut. Check the labels, and avoid partially hydrogenated oil, which have unhealthy trans fats.   If youre thirsty, drink water. Coffee and tea are good in moderation, but skip sugary drinks and limit milk and dairy products to one or two daily servings.   The type of carbohydrate in the diet is more important than the amount. Some sources of carbohydrates, such as vegetables, fruits, whole grains, and beans-are healthier than others.   Finally, stay active  Signed, Thomasene Ripple, DO  10/06/2019 2:18 PM    [facility]

## 2019-10-07 ENCOUNTER — Other Ambulatory Visit: Payer: Self-pay

## 2019-10-07 LAB — CBC
Hematocrit: 35.3 % — ABNORMAL LOW (ref 37.5–51.0)
Hemoglobin: 11.6 g/dL — ABNORMAL LOW (ref 13.0–17.7)
MCH: 28.8 pg (ref 26.6–33.0)
MCHC: 32.9 g/dL (ref 31.5–35.7)
MCV: 88 fL (ref 79–97)
Platelets: 335 10*3/uL (ref 150–450)
RBC: 4.03 x10E6/uL — ABNORMAL LOW (ref 4.14–5.80)
RDW: 13 % (ref 11.6–15.4)
WBC: 8.2 10*3/uL (ref 3.4–10.8)

## 2019-10-07 LAB — BASIC METABOLIC PANEL
BUN/Creatinine Ratio: 15 (ref 10–24)
BUN: 14 mg/dL (ref 8–27)
CO2: 24 mmol/L (ref 20–29)
Calcium: 10 mg/dL (ref 8.6–10.2)
Chloride: 98 mmol/L (ref 96–106)
Creatinine, Ser: 0.95 mg/dL (ref 0.76–1.27)
GFR calc Af Amer: 93 mL/min/{1.73_m2} (ref >59)
GFR calc non Af Amer: 81 mL/min/{1.73_m2} (ref >59)
Glucose: 158 mg/dL — ABNORMAL HIGH (ref 65–99)
Potassium: 4.5 mmol/L (ref 3.5–5.2)
Sodium: 139 mmol/L (ref 134–144)

## 2019-10-07 LAB — MAGNESIUM: Magnesium: 1.6 mg/dL (ref 1.6–2.3)

## 2019-10-07 MED ORDER — PANTOPRAZOLE SODIUM 40 MG PO TBEC
40.0000 mg | DELAYED_RELEASE_TABLET | Freq: Every day | ORAL | 3 refills | Status: DC
Start: 2019-10-07 — End: 2020-10-12

## 2019-10-07 NOTE — Telephone Encounter (Signed)
Refill sent for Pantoprazole to [place]

## 2019-10-11 ENCOUNTER — Other Ambulatory Visit (HOSPITAL_COMMUNITY)
Admission: RE | Admit: 2019-10-11 | Discharge: 2019-10-11 | Disposition: A | Discharge status: Home / Self Care | Payer: Medicare HMO | Source: Ambulatory Visit | Attending: Internal Medicine | Admitting: Internal Medicine

## 2019-10-11 DIAGNOSIS — Z20822 Contact with and (suspected) exposure to covid-19: Secondary | ICD-10-CM | POA: Diagnosis not present

## 2019-10-11 DIAGNOSIS — Z01812 Encounter for preprocedural laboratory examination: Secondary | ICD-10-CM | POA: Diagnosis not present

## 2019-10-12 ENCOUNTER — Telehealth: Payer: Self-pay

## 2019-10-12 DIAGNOSIS — J329 Chronic sinusitis, unspecified: Secondary | ICD-10-CM | POA: Diagnosis not present

## 2019-10-12 LAB — SARS CORONAVIRUS 2 (TAT 6-24 HRS): SARS Coronavirus 2: NEGATIVE

## 2019-10-12 NOTE — Telephone Encounter (Signed)
Pt contacted pre-catheterization scheduled at [facility] for: Thursday 10/13/19 Verified arrival time and place: [facility]) at: 5:30 am    No solid food after midnight prior to cath, clear liquids until 5 AM day of procedure. CONTRAST ALLERGY: NO AM meds can be  taken pre-cath with sips of water including: ASA 81 mg  Do not take Diabetes Med Glucophage (Metformin) on the day of the procedure and HOLD 48 HOURS AFTER THE PROCEDURE.   Hold your Glimepiride the morning of your procedure   Hold Lasix the morning of your procedure    Confirmed patient has responsible adult to drive home post procedure and be with patient first 24 hours after arriving home:  You are allowed ONE visitor in the waiting room during the time you are at the hospital for your procedure. Both you and your visitor must wear a mask once you enter the hospital.       COVID-19 Pre-Screening Questions:  . In the past 10 days have you had a new cough, shortness of breath, headache, congestion, fever (100 or greater) unexplained body aches, new sore throat, or sudden loss of taste or sense of smell? NO . In the past 10 days have you been around anyone with known Covid 19? NO Have you been vaccinated for COVID-19? Yes x2 in the Spring 2021.

## 2019-10-13 ENCOUNTER — Ambulatory Visit (HOSPITAL_COMMUNITY)
Admission: RE | Admit: 2019-10-13 | Discharge: 2019-10-13 | Disposition: A | Discharge status: Home / Self Care | Payer: Medicare HMO | Attending: Internal Medicine | Admitting: Internal Medicine

## 2019-10-13 ENCOUNTER — Encounter (HOSPITAL_COMMUNITY): Payer: Self-pay | Admitting: Internal Medicine

## 2019-10-13 ENCOUNTER — Ambulatory Visit (HOSPITAL_COMMUNITY)
Admission: RE | Disposition: A | Discharge status: Home / Self Care | Payer: Self-pay | Source: Home / Self Care | Attending: Internal Medicine

## 2019-10-13 DIAGNOSIS — Z87891 Personal history of nicotine dependence: Secondary | ICD-10-CM | POA: Insufficient documentation

## 2019-10-13 DIAGNOSIS — M109 Gout, unspecified: Secondary | ICD-10-CM | POA: Diagnosis not present

## 2019-10-13 DIAGNOSIS — E1136 Type 2 diabetes mellitus with diabetic cataract: Secondary | ICD-10-CM | POA: Diagnosis not present

## 2019-10-13 DIAGNOSIS — Z880 Allergy status to penicillin: Secondary | ICD-10-CM | POA: Insufficient documentation

## 2019-10-13 DIAGNOSIS — I25118 Atherosclerotic heart disease of native coronary artery with other forms of angina pectoris: Secondary | ICD-10-CM | POA: Diagnosis present

## 2019-10-13 DIAGNOSIS — Z881 Allergy status to other antibiotic agents status: Secondary | ICD-10-CM | POA: Diagnosis not present

## 2019-10-13 DIAGNOSIS — Z6838 Body mass index (BMI) 38.0-38.9, adult: Secondary | ICD-10-CM | POA: Diagnosis not present

## 2019-10-13 DIAGNOSIS — F329 Major depressive disorder, single episode, unspecified: Secondary | ICD-10-CM | POA: Diagnosis not present

## 2019-10-13 DIAGNOSIS — Z888 Allergy status to other drugs, medicaments and biological substances status: Secondary | ICD-10-CM | POA: Insufficient documentation

## 2019-10-13 DIAGNOSIS — R0602 Shortness of breath: Secondary | ICD-10-CM | POA: Insufficient documentation

## 2019-10-13 DIAGNOSIS — E782 Mixed hyperlipidemia: Secondary | ICD-10-CM | POA: Diagnosis not present

## 2019-10-13 DIAGNOSIS — N400 Enlarged prostate without lower urinary tract symptoms: Secondary | ICD-10-CM | POA: Diagnosis not present

## 2019-10-13 DIAGNOSIS — R600 Localized edema: Secondary | ICD-10-CM | POA: Diagnosis not present

## 2019-10-13 DIAGNOSIS — Z791 Long term (current) use of non-steroidal anti-inflammatories (NSAID): Secondary | ICD-10-CM | POA: Insufficient documentation

## 2019-10-13 DIAGNOSIS — I10 Essential (primary) hypertension: Secondary | ICD-10-CM | POA: Insufficient documentation

## 2019-10-13 DIAGNOSIS — D500 Iron deficiency anemia secondary to blood loss (chronic): Secondary | ICD-10-CM | POA: Insufficient documentation

## 2019-10-13 DIAGNOSIS — Z833 Family history of diabetes mellitus: Secondary | ICD-10-CM | POA: Diagnosis not present

## 2019-10-13 DIAGNOSIS — Z79899 Other long term (current) drug therapy: Secondary | ICD-10-CM | POA: Insufficient documentation

## 2019-10-13 DIAGNOSIS — K219 Gastro-esophageal reflux disease without esophagitis: Secondary | ICD-10-CM | POA: Insufficient documentation

## 2019-10-13 DIAGNOSIS — Z794 Long term (current) use of insulin: Secondary | ICD-10-CM | POA: Diagnosis not present

## 2019-10-13 DIAGNOSIS — E1142 Type 2 diabetes mellitus with diabetic polyneuropathy: Secondary | ICD-10-CM | POA: Diagnosis not present

## 2019-10-13 HISTORY — PX: LEFT HEART CATH AND CORONARY ANGIOGRAPHY: CATH118249

## 2019-10-13 HISTORY — DX: Atherosclerotic heart disease of native coronary artery with other forms of angina pectoris: I25.118

## 2019-10-13 LAB — GLUCOSE, CAPILLARY: Glucose-Capillary: 190 mg/dL — ABNORMAL HIGH (ref 70–99)

## 2019-10-13 SURGERY — LEFT HEART CATH AND CORONARY ANGIOGRAPHY
Anesthesia: LOCAL

## 2019-10-13 MED ORDER — HEPARIN (PORCINE) IN NACL 1000-0.9 UT/500ML-% IV SOLN
INTRAVENOUS | Status: DC | PRN
  Administered 2019-10-13: 500 mL

## 2019-10-13 MED ORDER — SODIUM CHLORIDE 0.9% FLUSH: 3.0000 mL | INTRAVENOUS | Status: DC | PRN

## 2019-10-13 MED ORDER — HYDRALAZINE HCL 20 MG/ML IJ SOLN: 10.0000 mg | INTRAMUSCULAR | Status: DC | PRN

## 2019-10-13 MED ORDER — VERAPAMIL HCL 2.5 MG/ML IV SOLN
INTRAVENOUS | Status: DC | PRN
  Administered 2019-10-13: 10 mL via INTRA_ARTERIAL

## 2019-10-13 MED ORDER — MIDAZOLAM HCL 2 MG/2ML IJ SOLN
INTRAMUSCULAR | Status: DC | PRN
  Administered 2019-10-13: 1 mg via INTRAVENOUS

## 2019-10-13 MED ORDER — HEPARIN SODIUM (PORCINE) 1000 UNIT/ML IJ SOLN
INTRAMUSCULAR | Status: AC
  Filled 2019-10-13: qty 1

## 2019-10-13 MED ORDER — SODIUM CHLORIDE 0.9 % WEIGHT BASED INFUSION
3.0000 mL/kg/h | INTRAVENOUS | Status: AC
  Administered 2019-10-13: 3 mL/kg/h via INTRAVENOUS

## 2019-10-13 MED ORDER — SODIUM CHLORIDE 0.9 % IV SOLN: 250.0000 mL | INTRAVENOUS | Status: DC | PRN

## 2019-10-13 MED ORDER — FENTANYL CITRATE (PF) 100 MCG/2ML IJ SOLN
INTRAMUSCULAR | Status: AC
  Filled 2019-10-13: qty 2

## 2019-10-13 MED ORDER — LIDOCAINE HCL (PF) 1 % IJ SOLN
INTRAMUSCULAR | Status: DC | PRN
  Administered 2019-10-13: 1 mL

## 2019-10-13 MED ORDER — LABETALOL HCL 5 MG/ML IV SOLN: 10.0000 mg | INTRAVENOUS | Status: DC | PRN

## 2019-10-13 MED ORDER — ASPIRIN 81 MG PO CHEW: 81.0000 mg | CHEWABLE_TABLET | ORAL | Status: DC

## 2019-10-13 MED ORDER — HEPARIN (PORCINE) IN NACL 1000-0.9 UT/500ML-% IV SOLN
INTRAVENOUS | Status: AC
  Filled 2019-10-13: qty 500

## 2019-10-13 MED ORDER — SODIUM CHLORIDE 0.9% FLUSH: 3.0000 mL | Freq: Two times a day (BID) | INTRAVENOUS | Status: DC

## 2019-10-13 MED ORDER — HEPARIN SODIUM (PORCINE) 1000 UNIT/ML IJ SOLN
INTRAMUSCULAR | Status: DC | PRN
  Administered 2019-10-13: 5000 [IU] via INTRAVENOUS

## 2019-10-13 MED ORDER — FUROSEMIDE 40 MG PO TABS: 40.0000 mg | ORAL_TABLET | Freq: Two times a day (BID) | ORAL | 3 refills | Status: DC

## 2019-10-13 MED ORDER — ACETAMINOPHEN 325 MG PO TABS: 650.0000 mg | ORAL_TABLET | ORAL | Status: DC | PRN

## 2019-10-13 MED ORDER — VERAPAMIL HCL 2.5 MG/ML IV SOLN
INTRAVENOUS | Status: AC
  Filled 2019-10-13: qty 2

## 2019-10-13 MED ORDER — ONDANSETRON HCL 4 MG/2ML IJ SOLN: 4.0000 mg | Freq: Four times a day (QID) | INTRAMUSCULAR | Status: DC | PRN

## 2019-10-13 MED ORDER — ISOSORBIDE MONONITRATE ER 30 MG PO TB24: 30.0000 mg | ORAL_TABLET | Freq: Every day | ORAL | 5 refills | Status: DC

## 2019-10-13 MED ORDER — IOHEXOL 350 MG/ML SOLN
INTRAVENOUS | Status: DC | PRN
  Administered 2019-10-13: 125 mL

## 2019-10-13 MED ORDER — SODIUM CHLORIDE 0.9 % WEIGHT BASED INFUSION: 1.0000 mL/kg/h | INTRAVENOUS | Status: DC

## 2019-10-13 MED ORDER — METFORMIN HCL 1000 MG PO TABS: 1000.0000 mg | ORAL_TABLET | Freq: Two times a day (BID) | ORAL | Status: AC

## 2019-10-13 MED ORDER — MIDAZOLAM HCL 2 MG/2ML IJ SOLN
INTRAMUSCULAR | Status: AC
  Filled 2019-10-13: qty 2

## 2019-10-13 MED ORDER — LIDOCAINE HCL (PF) 1 % IJ SOLN
INTRAMUSCULAR | Status: AC
  Filled 2019-10-13: qty 30

## 2019-10-13 MED ORDER — FENTANYL CITRATE (PF) 100 MCG/2ML IJ SOLN
INTRAMUSCULAR | Status: DC | PRN
Start: 2019-10-13 — End: 2019-10-13
  Administered 2019-10-13: 25 ug via INTRAVENOUS

## 2019-10-13 SURGICAL SUPPLY — 11 items
CATH IMPULSE 5F ANG/FL3.5 (CATHETERS) ×2 IMPLANT
CATH INFINITI 5 FR 3DRC (CATHETERS) ×2 IMPLANT
CATH INFINITI 5 FR MPA2 (CATHETERS) ×2 IMPLANT
DEVICE RAD COMP TR BAND LRG (VASCULAR PRODUCTS) ×2 IMPLANT
GLIDESHEATH SLEND SS 6F .021 (SHEATH) ×2 IMPLANT
GUIDEWIRE INQWIRE 1.5J.035X260 (WIRE) ×1 IMPLANT
INQWIRE 1.5J .035X260CM (WIRE) ×2
KIT HEART LEFT (KITS) ×2 IMPLANT
PACK CARDIAC CATHETERIZATION (CUSTOM PROCEDURE TRAY) ×2 IMPLANT
TRANSDUCER W/STOPCOCK (MISCELLANEOUS) ×2 IMPLANT
TUBING CIL FLEX 10 FLL-RA (TUBING) ×2 IMPLANT

## 2019-10-13 NOTE — Discharge Instructions (Signed)
DRINK PLENTY OF FLUIDS FOR THE NEXT 2-3 DAYS.  KEEP ARM ELEVATED THE REMAINDER OF THE DAY.  Radial Site Care  This sheet gives you information about how to care for yourself after your procedure. Your health care provider may also give you more specific instructions. If you have problems or questions, contact your health care provider. What can I expect after the procedure? After the procedure, it is common to have:  Bruising and tenderness at the catheter insertion area. Follow these instructions at home: Medicines  Take over-the-counter and prescription medicines only as told by your health care provider. Insertion site care 1. Follow instructions from your health care provider about how to take care of your insertion site. Make sure you: ? Wash your hands with soap and water before you change your bandage (dressing). If soap and water are not available, use hand sanitizer. ? Change your dressing as told by your health care provider. 2. Check your insertion site every day for signs of infection. Check for: ? Redness, swelling, or pain. ? Fluid or blood. ? Pus or a bad smell. ? Warmth. 3. Do not take baths, swim, or use a hot tub for 5 days. 4. You may shower 24-48 hours after the procedure. ? Remove the dressing and gently wash the site with plain soap and water. ? Pat the area dry with a clean towel. ? Do not rub the site. That could cause bleeding. 5. Do not apply powder or lotion to the site. Activity  1. For 24 hours after the procedure, or as directed by your health care provider: ? Do not flex or bend the affected arm. ? Do not push or pull heavy objects with the affected arm. ? Do not drive yourself home from the hospital or clinic. You may drive 24 hours after the procedure. ? Do not operate machinery or power tools. 2. Do not push, pull or lift anything that is heavier than 10 lb for 5 days. 3. Ask your health care provider when it is okay to: ? Return to work or  school. ? Resume usual physical activities or sports. ? Resume sexual activity. General instructions  If the catheter site starts to bleed, raise your arm and put firm pressure on the site. If the bleeding does not stop, get help right away. This is a medical emergency.  If you went home on the same day as your procedure, a responsible adult should be with you for the first 24 hours after you arrive home.  Keep all follow-up visits as told by your health care provider. This is important. Contact a health care provider if:  You have a fever.  You have redness, swelling, or yellow drainage around your insertion site. Get help right away if:  You have unusual pain at the radial site.  The catheter insertion area swells very fast.  The insertion area is bleeding, and the bleeding does not stop when you hold steady pressure on the area.  Your arm or hand becomes pale, cool, tingly, or numb. These symptoms may represent a serious problem that is an emergency. Do not wait to see if the symptoms will go away. Get medical help right away. Call your local emergency services (911 in the [place] Do not drive yourself to the hospital. Summary  After the procedure, it is common to have bruising and tenderness at the site.  Follow instructions from your health care provider about how to take care of your radial site wound. Check   the wound every day for signs of infection.  Do not push, pull or lift anything that is heavier than 10 lb for 5 days.  This information is not intended to replace advice given to you by your health care provider. Make sure you discuss any questions you have with your health care provider. Document Revised: 02/25/2017 Document Reviewed: 02/25/2017 Elsevier Patient Education  2020 [employer] 

## 2019-10-13 NOTE — Interval H&P Note (Signed)
History and Physical Interval Note:  10/13/2019 7:15 AM  [name] Williams  has presented today for surgery, with the diagnosis of stable angina and abnormal cardiac CTA  The various methods of treatment have been discussed with the patient and family. After consideration of risks, benefits and other options for treatment, the patient has consented to  Procedure(s): LEFT HEART CATH AND CORONARY ANGIOGRAPHY (N/A) as a surgical intervention.  The patient's history has been reviewed, patient examined, no change in status, stable for surgery.  I have reviewed the patient's chart and labs.  Questions were answered to the patient's satisfaction.    Cath Lab Visit (complete for each Cath Lab visit)  Clinical Evaluation Leading to the Procedure:   ACS: No.  Non-ACS:    Anginal Classification: CCS III  Anti-ischemic medical therapy: Maximal Therapy (2 or more classes of medications)  Non-Invasive Test Results: Intermediate-risk stress test findings: cardiac mortality 1-3%/year  Prior CABG: No previous CABG  [name] Williams

## 2019-10-14 MED FILL — Heparin Sod (Porcine)-NaCl IV Soln 1000 Unit/500ML-0.9%: INTRAVENOUS | Qty: 500 | Status: AC

## 2019-11-08 ENCOUNTER — Ambulatory Visit: Payer: Medicare HMO | Admitting: Gastroenterology

## 2019-11-29 DIAGNOSIS — Z1331 Encounter for screening for depression: Secondary | ICD-10-CM | POA: Diagnosis not present

## 2019-11-29 DIAGNOSIS — Z139 Encounter for screening, unspecified: Secondary | ICD-10-CM | POA: Diagnosis not present

## 2019-11-29 DIAGNOSIS — E785 Hyperlipidemia, unspecified: Secondary | ICD-10-CM | POA: Diagnosis not present

## 2019-11-29 DIAGNOSIS — E669 Obesity, unspecified: Secondary | ICD-10-CM | POA: Diagnosis not present

## 2019-11-29 DIAGNOSIS — Z9181 History of falling: Secondary | ICD-10-CM | POA: Diagnosis not present

## 2019-11-29 DIAGNOSIS — Z0000 Encounter for general adult medical examination without abnormal findings: Secondary | ICD-10-CM | POA: Diagnosis not present

## 2019-12-13 DIAGNOSIS — J019 Acute sinusitis, unspecified: Secondary | ICD-10-CM | POA: Diagnosis not present

## 2019-12-13 DIAGNOSIS — B9689 Other specified bacterial agents as the cause of diseases classified elsewhere: Secondary | ICD-10-CM | POA: Diagnosis not present

## 2020-01-03 ENCOUNTER — Other Ambulatory Visit: Payer: Self-pay

## 2020-01-03 DIAGNOSIS — H269 Unspecified cataract: Secondary | ICD-10-CM | POA: Insufficient documentation

## 2020-01-03 DIAGNOSIS — I10 Essential (primary) hypertension: Secondary | ICD-10-CM | POA: Insufficient documentation

## 2020-01-03 DIAGNOSIS — Z5189 Encounter for other specified aftercare: Secondary | ICD-10-CM | POA: Insufficient documentation

## 2020-01-05 ENCOUNTER — Other Ambulatory Visit: Payer: Self-pay

## 2020-01-05 ENCOUNTER — Ambulatory Visit (INDEPENDENT_AMBULATORY_CARE_PROVIDER_SITE_OTHER): Payer: Medicare HMO | Admitting: Cardiology

## 2020-01-05 ENCOUNTER — Encounter: Payer: Self-pay | Admitting: Cardiology

## 2020-01-05 VITALS — BP 126/62 | HR 88 | Ht 71.0 in | Wt 276.8 lb

## 2020-01-05 DIAGNOSIS — I2510 Atherosclerotic heart disease of native coronary artery without angina pectoris: Secondary | ICD-10-CM | POA: Diagnosis not present

## 2020-01-05 DIAGNOSIS — I25118 Atherosclerotic heart disease of native coronary artery with other forms of angina pectoris: Secondary | ICD-10-CM | POA: Diagnosis not present

## 2020-01-05 DIAGNOSIS — I10 Essential (primary) hypertension: Secondary | ICD-10-CM | POA: Diagnosis not present

## 2020-01-05 DIAGNOSIS — E782 Mixed hyperlipidemia: Secondary | ICD-10-CM | POA: Diagnosis not present

## 2020-01-05 NOTE — Progress Notes (Signed)
Cardiology Office Note:    Date:  01/05/2020   ID:  [name] Williams, DOB September 20, 1950, MRN [id number]  PCP:  [name] Fusi, MD  Cardiologist:  [name] Ripple, DO  Electrophysiologist:  None   Referring MD: [name] Fusi, MD   " I am doing ok"  History of Present Illness:    [name] Williams a 69 y.o.malewith a hx of diabetes mellitus, hypertension, obesity, hyperlipidemia, coronary artery disease by coronary CTA and abnormal FFR CT. The patient is planning cardiac catheterization he was noted to have a hemoglobin of 6.8and he was sent to the hospital at which time he got 2 packed cells of red blood cells.  I saw the patient 06/08/2019 at that time he was supposed to see GI. In there interim hewas suppose to see GI- [name] Williams but called and reported that he would rather see someone in [place]. He then saw [name] Williams in [place] but he was referred back to [name]. Chales Williams for her endoscopy.  I did see the patient on July 08, 2019 at that time he had a scheduled procedure with [name]. Chales Williams on June 25.  He was able to get his colonoscopy with [name]. Chales Williams showed evidence of 2 small AVMs and diverticulosis.  I saw the patient on October 06, 2019 at that time he still have some shortness of breath are concerning for angina we proceeded for a left heart catheterization.  He did not undergo any PCI during that time it was recommended that we continue to increase his antianginal and use symptoms persisted despite antianginals PCI to the RCA will be considered. He is here today for follow-up visit.  He tells me that he has been doing well from a cardiovascular standpoint he has had some shortness of breath but he nothing worse than his baseline.  No chest pain.  Past Medical History:  Diagnosis Date  . Anemia 05/18/2019  . Anxiety disorder   . Blood transfusion without reported diagnosis   . BPH (benign prostatic hyperplasia)   . Cataract   . Colon polyps   . DDD (degenerative disc disease),  cervical   . Depression   . Dyslipidemia 05/29/2017  . Essential hypertension 05/29/2017  . Generalized osteoarthritis of multiple sites   . Gout   . Hyperlipidemia   . Hypertension, benign   . Laryngopharyngeal reflux   . Morbid obesity (HCC) 11/26/2018  . Shortness of breath 07/17/2017  . Sinus arrhythmia 11/26/2018  . Swelling 05/29/2017  . Tobacco abuse   . Type 2 diabetes mellitus without complication, without long-term current use of insulin (HCC) 05/29/2017  . Uncontrolled type 2 diabetes mellitus with peripheral neuropathy [facility])     Past Surgical History:  Procedure Laterality Date  . HERNIA REPAIR  1998   Abdominal  . LEFT HEART CATH AND CORONARY ANGIOGRAPHY N/A 10/13/2019   Procedure: LEFT HEART CATH AND CORONARY ANGIOGRAPHY;  Surgeon: [name] Kendall, MD;  Location: [facility];  Service: Cardiovascular;  Laterality: N/A;  . MINOR HEMORRHOIDECTOMY  1998  . NASAL SINUS SURGERY  1978  . SHOULDER SURGERY Left    X2  . TONSILLECTOMY  1967    Current Medications: Current Meds  Medication Sig  . allopurinol (ZYLOPRIM) 300 MG tablet Take 300 mg by mouth daily.  Marland Kitchen amLODipine (NORVASC) 5 MG tablet Take 5 mg by mouth daily.   Marland Kitchen aspirin EC 81 MG tablet Take 81 mg by mouth daily. Swallow whole.  Marland Kitchen atorvastatin (LIPITOR) 80 MG tablet  Take 80 mg by mouth at bedtime.   . cetirizine (ZYRTEC) 10 MG tablet Take 10 mg by mouth daily.   . citalopram (CELEXA) 20 MG tablet Take 20 mg by mouth daily.  Marland Kitchen docusate sodium (COLACE) 100 MG capsule Take 100 mg by mouth daily.  Marland Kitchen doxycycline (VIBRA-TABS) 100 MG tablet Take 100 mg by mouth 2 (two) times daily.  . ferrous sulfate 325 (65 FE) MG tablet Take 1 tablet (325 mg total) by mouth every other day.  . furosemide (LASIX) 40 MG tablet Take 1 tablet (40 mg total) by mouth 2 (two) times daily.  Marland Kitchen gemfibrozil (LOPID) 600 MG tablet Take 600 mg by mouth 2 (two) times daily before a meal.  . glimepiride (AMARYL) 4 MG tablet Take 4 mg by mouth  2 (two) times daily.  . isosorbide mononitrate (IMDUR) 30 MG 24 hr tablet Take 1 tablet (30 mg total) by mouth daily.  Marland Kitchen losartan (COZAAR) 100 MG tablet Take 100 mg by mouth at bedtime.   . metFORMIN (GLUCOPHAGE) 1000 MG tablet Take 1 tablet (1,000 mg total) by mouth 2 (two) times daily with a meal.  . metoprolol succinate (TOPROL XL) 25 MG 24 hr tablet Take 0.5 tablets (12.5 mg total) by mouth daily.  . montelukast (SINGULAIR) 10 MG tablet Take 10 mg by mouth daily.   . naphazoline-pheniramine (NAPHCON-A) 0.025-0.3 % ophthalmic solution Place 1 drop into both eyes 3 (three) times daily as needed for eye irritation (dry/irritated/allergy eyes.).   Marland Kitchen nitroGLYCERIN (NITROSTAT) 0.4 MG SL tablet Place 1 tablet (0.4 mg total) under the tongue every 5 (five) minutes as needed for chest pain. (Patient taking differently: Place 0.4 mg under the tongue every 5 (five) minutes x 3 doses as needed for chest pain. )  . NOVOLOG FLEXPEN 100 UNIT/ML FlexPen Inject 20 Units into the skin in the morning, at noon, and at bedtime.   . Omega-3 Fatty Acids (OMEGA 3 PO) Take 2 capsules by mouth daily. Omega XL  . omeprazole (PRILOSEC) 40 MG capsule Take 40 mg by mouth at bedtime.  . pantoprazole (PROTONIX) 40 MG tablet Take 1 tablet (40 mg total) by mouth daily. (Patient taking differently: Take 40 mg by mouth daily before breakfast. )  . potassium chloride SA (KLOR-CON) 20 MEQ tablet Take 1 tablet (20 mEq total) by mouth daily.  . tamsulosin (FLOMAX) 0.4 MG CAPS capsule Take 0.4 mg by mouth daily.  . traMADol-acetaminophen (ULTRACET) 37.5-325 MG tablet   . TRESIBA FLEXTOUCH 100 UNIT/ML FlexTouch Pen Inject 60 Units into the skin daily.      Allergies:   Pseudoephedrine, Penicillins, and Tetracyclines & related   Social History   Socioeconomic History  . Marital status: Married    Spouse name: Not on file  . Number of children: Not on file  . Years of education: Not on file  . Highest education level: Not on  file  Occupational History  . Not on file  Tobacco Use  . Smoking status: Former Smoker    Quit date: 2015    Years since quitting: 6.9  . Smokeless tobacco: Former Neurosurgeon    Types: Chew  Substance and Sexual Activity  . Alcohol use: Not Currently  . Drug use: Never  . Sexual activity: Not on file  Other Topics Concern  . Not on file  Social History Narrative  . Not on file   Social Determinants of Health   Financial Resource Strain:   . Difficulty of Paying Living Expenses:  Not on file  Food Insecurity:   . Worried About Programme researcher, broadcasting/film/video in the Last Year: Not on file  . Ran Out of Food in the Last Year: Not on file  Transportation Needs:   . Lack of Transportation (Medical): Not on file  . Lack of Transportation (Non-Medical): Not on file  Physical Activity:   . Days of Exercise per Week: Not on file  . Minutes of Exercise per Session: Not on file  Stress:   . Feeling of Stress : Not on file  Social Connections:   . Frequency of Communication with Friends and Family: Not on file  . Frequency of Social Gatherings with Friends and Family: Not on file  . Attends Religious Services: Not on file  . Active Member of Clubs or Organizations: Not on file  . Attends Banker Meetings: Not on file  . Marital Status: Not on file     Family History: The patient's family history includes Alcohol abuse in his father; Diabetes in his brother; Lung cancer in his father; Stomach cancer in his maternal grandmother and paternal grandmother; Stroke in his father. There is no history of Colon cancer, Esophageal cancer, or Rectal cancer.  ROS:   Review of Systems  Constitution: Negative for decreased appetite, fever and weight gain.  HENT: Negative for congestion, ear discharge, hoarse voice and sore throat.   Eyes: Negative for discharge, redness, vision loss in right eye and visual halos.  Cardiovascular: Negative for chest pain, dyspnea on exertion, leg swelling, orthopnea  and palpitations.  Respiratory: Negative for cough, hemoptysis, shortness of breath and snoring.   Endocrine: Negative for heat intolerance and polyphagia.  Hematologic/Lymphatic: Negative for bleeding problem. Does not bruise/bleed easily.  Skin: Negative for flushing, nail changes, rash and suspicious lesions.  Musculoskeletal: Negative for arthritis, joint pain, muscle cramps, myalgias, neck pain and stiffness.  Gastrointestinal: Negative for abdominal pain, bowel incontinence, diarrhea and excessive appetite.  Genitourinary: Negative for decreased libido, genital sores and incomplete emptying.  Neurological: Negative for brief paralysis, focal weakness, headaches and loss of balance.  Psychiatric/Behavioral: Negative for altered mental status, depression and suicidal ideas.  Allergic/Immunologic: Negative for HIV exposure and persistent infections.    EKGs/Labs/Other Studies Reviewed:    The following studies were reviewed today:   EKG: None today  LHC Conclusions: 1. Moderate to severe, non-critical two vessel coronary artery disease with sequential 50% proximal/mid LAD stenoses as well as tandem 60-70% proximal RCA lesions.  There is also a 70% stenosis involving rPL1. 2. Grossly normal left ventricular systolic function with moderately elevated filling pressure (LVEDP 25-30 mmHg). 3. Tortuous right subclavian artery limiting catheter manipulation.  Consider use of a long radial sheath versus alternate access if catheterization is needed in the future.  Recommendations: 1. Given recent severe anemia and improving symptoms with rising hemoglobin, as well as moderately elevated LVEDP, I favor optimization of medical therapy.  I will increase furosemide to 40 mg BID and add isosorbide mononitrate 30 mg daily.  If Mr. Dildy has refractory symptoms despite optimal medical therapy, PCI to the RCA could be considered.  He should be challenged with dual antiplatelet therapy before  proceeding with PCI to ensure that he does not experience recurrent GI bleeding. 2. Continue aggressive secondary prevention of coronary artery disease.   Recent Labs: 05/18/2019: ALT 16 10/06/2019: BUN 14; Creatinine, Ser 0.95; Hemoglobin 11.6; Magnesium 1.6; Platelets 335; Potassium 4.5; Sodium 139  Recent Lipid Panel No results found for: CHOL, TRIG,  HDL, CHOLHDL, VLDL, LDLCALC, LDLDIRECT  Physical Exam:    VS:  BP 126/62   Pulse 88   Ht 5\' 11"  (1.803 m)   Wt 276 lb 12.8 oz (125.6 kg)   SpO2 96%   BMI 38.61 kg/m     Wt Readings from Last 3 Encounters:  01/05/20 276 lb 12.8 oz (125.6 kg)  10/13/19 275 lb (124.7 kg)  10/06/19 276 lb 9.6 oz (125.5 kg)     GEN: Well nourished, well developed in no acute distress HEENT: Normal NECK: No JVD; No carotid bruits LYMPHATICS: No lymphadenopathy CARDIAC: S1S2 noted,RRR, no murmurs, rubs, gallops RESPIRATORY:  Clear to auscultation without rales, wheezing or rhonchi  ABDOMEN: Soft, non-tender, non-distended, +bowel sounds, no guarding. EXTREMITIES: No edema, No cyanosis, no clubbing MUSCULOSKELETAL:  No deformity  SKIN: Warm and dry NEUROLOGIC:  Alert and oriented x 3, non-focal PSYCHIATRIC:  Normal affect, good insight  ASSESSMENT:    1. Essential hypertension   2. Coronary artery disease, unspecified vessel or lesion type, unspecified whether angina present, unspecified whether native or transplanted heart   3. Coronary artery disease of native artery of native heart with stable angina pectoris (HCC)   4. Morbid obesity (HCC)   5. Mixed hyperlipidemia    PLAN:     I will try to consider patient is a current medication regimen for his coronary disease which includes aspirin 81 mg a day, atorvastatin 80 mg daily Imdur 30 mg daily and beta-blocker. His blood pressure is acceptable no changes will be made to his antihypertensive regimen. Hyperlipidemia continue patient on his current lipid-lowering medicine. Diabetes is being  managed by his primary care doctor. The patient understands the need to lose weight with diet and exercise. We have discussed specific strategies for this.  The patient is in agreement with the above plan. The patient left the office in stable condition.  The patient will follow up in 6 months or sooner if needed.   Medication Adjustments/Labs and Tests Ordered: Current medicines are reviewed at length with the patient today.  Concerns regarding medicines are outlined above.  Orders Placed This Encounter  Procedures  . Basic metabolic panel  . Magnesium  . CBC   No orders of the defined types were placed in this encounter.   Patient Instructions  Medication Instructions:  Your physician recommends that you continue on your current medications as directed. Please refer to the Current Medication list given to you today.  *If you need a refill on your cardiac medications before your next appointment, please call your pharmacy*   Lab Work: Your physician recommends that you return for lab work today: bmp, mg, cbc  If you have labs (blood work) drawn today and your tests are completely normal, you will receive your results only by: Marland Kitchen MyChart Message (if you have MyChart) OR . A paper copy in the mail If you have any lab test that is abnormal or we need to change your treatment, we will call you to review the results.   Testing/Procedures: None    Follow-Up: At [facility], you and your health needs are our priority.  As part of our continuing mission to provide you with exceptional heart care, we have created designated Provider Care Teams.  These Care Teams include your primary Cardiologist (physician) and Advanced Practice Providers (APPs -  Physician Assistants and Nurse Practitioners) who all work together to provide you with the care you need, when you need it.  We recommend signing up for  the patient portal called "MyChart".  Sign up information is provided on this After  Visit Summary.  MyChart is used to connect with patients for Virtual Visits (Telemedicine).  Patients are able to view lab/test results, encounter notes, upcoming appointments, etc.  Non-urgent messages can be sent to your provider as well.   To learn more about what you can do with MyChart, go to [email or web address].    Your next appointment:   6 month(s)  The format for your next appointment:   In Person  Provider:   Thomasene Ripple, DO   Other Instructions      Adopting a Healthy Lifestyle.  Know what a healthy weight is for you (roughly BMI <25) and aim to maintain this   Aim for 7+ servings of fruits and vegetables daily   65-80+ fluid ounces of water or unsweet tea for healthy kidneys   Limit to max 1 drink of alcohol per day; avoid smoking/tobacco   Limit animal fats in diet for cholesterol and heart health - choose grass fed whenever available   Avoid highly processed foods, and foods high in saturated/trans fats   Aim for low stress - take time to unwind and care for your mental health   Aim for 150 min of moderate intensity exercise weekly for heart health, and weights twice weekly for bone health   Aim for 7-9 hours of sleep daily   When it comes to diets, agreement about the perfect plan isnt easy to find, even among the experts. Experts at the [facility] of [employer] developed an idea known as the Healthy Eating Plate. Just imagine a plate divided into logical, healthy portions.   The emphasis is on diet quality:   Load up on vegetables and fruits - one-half of your plate: Aim for color and variety, and remember that potatoes dont count.   Go for whole grains - one-quarter of your plate: Whole wheat, barley, wheat berries, quinoa, oats, brown rice, and foods made with them. If you want pasta, go with whole wheat pasta.   Protein power - one-quarter of your plate: Fish, chicken, beans, and nuts are all healthy, versatile protein sources. Limit red  meat.   The diet, however, does go beyond the plate, offering a few other suggestions.   Use healthy plant oils, such as olive, canola, soy, corn, sunflower and peanut. Check the labels, and avoid partially hydrogenated oil, which have unhealthy trans fats.   If youre thirsty, drink water. Coffee and tea are good in moderation, but skip sugary drinks and limit milk and dairy products to one or two daily servings.   The type of carbohydrate in the diet is more important than the amount. Some sources of carbohydrates, such as vegetables, fruits, whole grains, and beans-are healthier than others.   Finally, stay active  Signed, [name] Ripple, DO  01/05/2020 11:17 AM    [facility]

## 2020-01-05 NOTE — Patient Instructions (Signed)
Medication Instructions:  Your physician recommends that you continue on your current medications as directed. Please refer to the Current Medication list given to you today.  *If you need a refill on your cardiac medications before your next appointment, please call your pharmacy*   Lab Work: Your physician recommends that you return for lab work today: bmp, mg, cbc  If you have labs (blood work) drawn today and your tests are completely normal, you will receive your results only by:  [place] (if you have MyChart) OR  A paper copy in the mail If you have any lab test that is abnormal or we need to change your treatment, we will call you to review the results.   Testing/Procedures: None    Follow-Up: At [facility], you and your health needs are our priority.  As part of our continuing mission to provide you with exceptional heart care, we have created designated Provider Care Teams.  These Care Teams include your primary Cardiologist (physician) and Advanced Practice Providers (APPs -  Physician Assistants and Nurse Practitioners) who all work together to provide you with the care you need, when you need it.  We recommend signing up for the patient portal called "MyChart".  Sign up information is provided on this After Visit Summary.  MyChart is used to connect with patients for Virtual Visits (Telemedicine).  Patients are able to view lab/test results, encounter notes, upcoming appointments, etc.  Non-urgent messages can be sent to your provider as well.   To learn more about what you can do with MyChart, go to [email or web address].    Your next appointment:   6 month(s)  The format for your next appointment:   In Person  Provider:   Berniece Salines, DO   Other Instructions

## 2020-01-06 ENCOUNTER — Telehealth: Payer: Self-pay

## 2020-01-06 DIAGNOSIS — Z23 Encounter for immunization: Secondary | ICD-10-CM | POA: Diagnosis not present

## 2020-01-06 LAB — CBC
Hematocrit: 33.8 % — ABNORMAL LOW (ref 37.5–51.0)
Hemoglobin: 10.9 g/dL — ABNORMAL LOW (ref 13.0–17.7)
MCH: 27.5 pg (ref 26.6–33.0)
MCHC: 32.2 g/dL (ref 31.5–35.7)
MCV: 85 fL (ref 79–97)
Platelets: 298 10*3/uL (ref 150–450)
RBC: 3.97 x10E6/uL — ABNORMAL LOW (ref 4.14–5.80)
RDW: 14 % (ref 11.6–15.4)
WBC: 8 10*3/uL (ref 3.4–10.8)

## 2020-01-06 LAB — BASIC METABOLIC PANEL
BUN/Creatinine Ratio: 22 (ref 10–24)
BUN: 20 mg/dL (ref 8–27)
CO2: 24 mmol/L (ref 20–29)
Calcium: 10.4 mg/dL — ABNORMAL HIGH (ref 8.6–10.2)
Chloride: 98 mmol/L (ref 96–106)
Creatinine, Ser: 0.9 mg/dL (ref 0.76–1.27)
GFR calc Af Amer: 100 mL/min/{1.73_m2} (ref >59)
GFR calc non Af Amer: 86 mL/min/{1.73_m2} (ref >59)
Glucose: 169 mg/dL — ABNORMAL HIGH (ref 65–99)
Potassium: 4.2 mmol/L (ref 3.5–5.2)
Sodium: 137 mmol/L (ref 134–144)

## 2020-01-06 LAB — MAGNESIUM: Magnesium: 1.4 mg/dL — ABNORMAL LOW (ref 1.6–2.3)

## 2020-01-06 NOTE — Telephone Encounter (Signed)
-----   Message from Berniece Salines, DO sent at 01/06/2020 10:17 AM EST ----- Please send the patient 400 mg of magnesium twice daily for 7 days.  His hemoglobin is stable compared to 3 months ago.  His blood glucose is slightly elevated

## 2020-01-06 NOTE — Telephone Encounter (Signed)
Attempted to call the patient regarding results. No answer, voicemail box full so unable to leave a message.

## 2020-01-09 ENCOUNTER — Other Ambulatory Visit: Payer: Self-pay

## 2020-01-09 MED ORDER — MAGNESIUM 400 MG PO CAPS: 400.0000 mg | ORAL_CAPSULE | Freq: Two times a day (BID) | ORAL | 0 refills | Status: DC

## 2020-01-09 NOTE — Telephone Encounter (Signed)
-----   Message from Berniece Salines, DO sent at 01/06/2020 10:17 AM EST ----- Please send the patient 400 mg of magnesium twice daily for 7 days.  His hemoglobin is stable compared to 3 months ago.  His blood glucose is slightly elevated

## 2020-01-09 NOTE — Telephone Encounter (Signed)
Spoke with patient regarding results and recommendation.  Patient verbalizes understanding and is agreeable to plan of care. Advised patient to call back with any issues or concerns.  

## 2020-01-11 ENCOUNTER — Telehealth: Payer: Self-pay | Admitting: Cardiology

## 2020-01-11 MED ORDER — MAGNESIUM 400 MG PO CAPS: 400.0000 mg | ORAL_CAPSULE | Freq: Two times a day (BID) | ORAL | 0 refills | Status: DC

## 2020-01-11 NOTE — Telephone Encounter (Signed)
*  STAT* If patient is at the pharmacy, call can be transferred to refill team.   1. Which medications need to be refilled? (please list name of each medication and dose if known) Magnesium 400 MG CAPS  2. Which pharmacy/location (including street and city if local pharmacy) is medication to be sent to? [place], [place].  3. Do they need a 30 day or 90 day supply? 14 capsules  Per patient, [facility] pharmacist informed the patient that they have not received the prescription and they are requesting to have it resubmitted. Please assist.

## 2020-01-11 NOTE — Addendum Note (Signed)
Addended by: Resa Miner I on: 01/11/2020 12:26 PM   Modules accepted: Orders

## 2020-01-11 NOTE — Telephone Encounter (Signed)
Prescription sent in again per request

## 2020-01-16 ENCOUNTER — Telehealth: Payer: Self-pay | Admitting: Cardiology

## 2020-01-16 MED ORDER — MAGNESIUM 400 MG PO CAPS: 400.0000 mg | ORAL_CAPSULE | Freq: Two times a day (BID) | ORAL | 0 refills | Status: DC

## 2020-01-16 NOTE — Telephone Encounter (Signed)
°*  STAT* If patient is at the pharmacy, call can be transferred to refill team.   1. Which medications need to be refilled? (please list name of each medication and dose if known) Magnesium 400 MG CAPS  2. Which pharmacy/location (including street and city if local pharmacy) is medication to be sent to? [place], [place]  3. Do they need a 30 day or 90 day supply? 30   Patient states that [facility] still has not received prescription so he is wanting a new script sent to [facility]

## 2020-01-16 NOTE — Telephone Encounter (Signed)
Refill sent in per request.  

## 2020-01-18 ENCOUNTER — Other Ambulatory Visit: Payer: Self-pay | Admitting: Internal Medicine

## 2020-01-18 NOTE — Telephone Encounter (Signed)
Refill request

## 2020-01-19 NOTE — Telephone Encounter (Signed)
Error

## 2020-01-23 ENCOUNTER — Other Ambulatory Visit: Payer: Self-pay | Admitting: Cardiology

## 2020-01-23 NOTE — Telephone Encounter (Signed)
Rx refill sent to pharmacy. 

## 2020-01-26 DIAGNOSIS — E119 Type 2 diabetes mellitus without complications: Secondary | ICD-10-CM | POA: Diagnosis not present

## 2020-01-26 DIAGNOSIS — H2513 Age-related nuclear cataract, bilateral: Secondary | ICD-10-CM | POA: Diagnosis not present

## 2020-02-16 ENCOUNTER — Other Ambulatory Visit: Payer: Self-pay | Admitting: Cardiology

## 2020-02-16 NOTE — Telephone Encounter (Signed)
Rx refill sent to pharmacy. 

## 2020-03-06 DIAGNOSIS — U071 COVID-19: Secondary | ICD-10-CM | POA: Diagnosis not present

## 2020-03-06 DIAGNOSIS — J9601 Acute respiratory failure with hypoxia: Secondary | ICD-10-CM | POA: Diagnosis not present

## 2020-03-06 DIAGNOSIS — J1282 Pneumonia due to coronavirus disease 2019: Secondary | ICD-10-CM | POA: Diagnosis not present

## 2020-03-07 DIAGNOSIS — J1282 Pneumonia due to coronavirus disease 2019: Secondary | ICD-10-CM | POA: Diagnosis not present

## 2020-03-07 DIAGNOSIS — U071 COVID-19: Secondary | ICD-10-CM | POA: Diagnosis not present

## 2020-03-07 DIAGNOSIS — J9601 Acute respiratory failure with hypoxia: Secondary | ICD-10-CM | POA: Diagnosis not present

## 2020-03-08 DIAGNOSIS — J9601 Acute respiratory failure with hypoxia: Secondary | ICD-10-CM | POA: Diagnosis not present

## 2020-03-08 DIAGNOSIS — U071 COVID-19: Secondary | ICD-10-CM | POA: Diagnosis not present

## 2020-03-08 DIAGNOSIS — J1282 Pneumonia due to coronavirus disease 2019: Secondary | ICD-10-CM | POA: Diagnosis not present

## 2020-03-10 DIAGNOSIS — E1165 Type 2 diabetes mellitus with hyperglycemia: Secondary | ICD-10-CM | POA: Diagnosis not present

## 2020-03-10 DIAGNOSIS — J440 Chronic obstructive pulmonary disease with acute lower respiratory infection: Secondary | ICD-10-CM | POA: Diagnosis not present

## 2020-03-10 DIAGNOSIS — K219 Gastro-esophageal reflux disease without esophagitis: Secondary | ICD-10-CM | POA: Diagnosis not present

## 2020-03-10 DIAGNOSIS — J1282 Pneumonia due to coronavirus disease 2019: Secondary | ICD-10-CM | POA: Diagnosis not present

## 2020-03-10 DIAGNOSIS — U071 COVID-19: Secondary | ICD-10-CM | POA: Diagnosis not present

## 2020-03-10 DIAGNOSIS — N400 Enlarged prostate without lower urinary tract symptoms: Secondary | ICD-10-CM | POA: Diagnosis not present

## 2020-03-10 DIAGNOSIS — I10 Essential (primary) hypertension: Secondary | ICD-10-CM | POA: Diagnosis not present

## 2020-03-10 DIAGNOSIS — E7800 Pure hypercholesterolemia, unspecified: Secondary | ICD-10-CM | POA: Diagnosis not present

## 2020-03-10 DIAGNOSIS — J9601 Acute respiratory failure with hypoxia: Secondary | ICD-10-CM | POA: Diagnosis not present

## 2020-03-13 DIAGNOSIS — U071 COVID-19: Secondary | ICD-10-CM | POA: Diagnosis not present

## 2020-03-13 DIAGNOSIS — J9601 Acute respiratory failure with hypoxia: Secondary | ICD-10-CM | POA: Diagnosis not present

## 2020-03-13 DIAGNOSIS — J449 Chronic obstructive pulmonary disease, unspecified: Secondary | ICD-10-CM | POA: Diagnosis not present

## 2020-03-13 DIAGNOSIS — J1282 Pneumonia due to coronavirus disease 2019: Secondary | ICD-10-CM | POA: Diagnosis not present

## 2020-03-14 DIAGNOSIS — I10 Essential (primary) hypertension: Secondary | ICD-10-CM | POA: Diagnosis not present

## 2020-03-14 DIAGNOSIS — U071 COVID-19: Secondary | ICD-10-CM | POA: Diagnosis not present

## 2020-03-14 DIAGNOSIS — N400 Enlarged prostate without lower urinary tract symptoms: Secondary | ICD-10-CM | POA: Diagnosis not present

## 2020-03-14 DIAGNOSIS — E7800 Pure hypercholesterolemia, unspecified: Secondary | ICD-10-CM | POA: Diagnosis not present

## 2020-03-14 DIAGNOSIS — J1282 Pneumonia due to coronavirus disease 2019: Secondary | ICD-10-CM | POA: Diagnosis not present

## 2020-03-14 DIAGNOSIS — J440 Chronic obstructive pulmonary disease with acute lower respiratory infection: Secondary | ICD-10-CM | POA: Diagnosis not present

## 2020-03-14 DIAGNOSIS — E1165 Type 2 diabetes mellitus with hyperglycemia: Secondary | ICD-10-CM | POA: Diagnosis not present

## 2020-03-14 DIAGNOSIS — J9601 Acute respiratory failure with hypoxia: Secondary | ICD-10-CM | POA: Diagnosis not present

## 2020-03-14 DIAGNOSIS — K219 Gastro-esophageal reflux disease without esophagitis: Secondary | ICD-10-CM | POA: Diagnosis not present

## 2020-03-16 DIAGNOSIS — E7800 Pure hypercholesterolemia, unspecified: Secondary | ICD-10-CM | POA: Diagnosis not present

## 2020-03-16 DIAGNOSIS — E1165 Type 2 diabetes mellitus with hyperglycemia: Secondary | ICD-10-CM | POA: Diagnosis not present

## 2020-03-16 DIAGNOSIS — N400 Enlarged prostate without lower urinary tract symptoms: Secondary | ICD-10-CM | POA: Diagnosis not present

## 2020-03-16 DIAGNOSIS — U071 COVID-19: Secondary | ICD-10-CM | POA: Diagnosis not present

## 2020-03-16 DIAGNOSIS — J9601 Acute respiratory failure with hypoxia: Secondary | ICD-10-CM | POA: Diagnosis not present

## 2020-03-16 DIAGNOSIS — K219 Gastro-esophageal reflux disease without esophagitis: Secondary | ICD-10-CM | POA: Diagnosis not present

## 2020-03-16 DIAGNOSIS — J1282 Pneumonia due to coronavirus disease 2019: Secondary | ICD-10-CM | POA: Diagnosis not present

## 2020-03-16 DIAGNOSIS — J440 Chronic obstructive pulmonary disease with acute lower respiratory infection: Secondary | ICD-10-CM | POA: Diagnosis not present

## 2020-03-16 DIAGNOSIS — I10 Essential (primary) hypertension: Secondary | ICD-10-CM | POA: Diagnosis not present

## 2020-03-19 DIAGNOSIS — J440 Chronic obstructive pulmonary disease with acute lower respiratory infection: Secondary | ICD-10-CM | POA: Diagnosis not present

## 2020-03-19 DIAGNOSIS — J1282 Pneumonia due to coronavirus disease 2019: Secondary | ICD-10-CM | POA: Diagnosis not present

## 2020-03-19 DIAGNOSIS — J9601 Acute respiratory failure with hypoxia: Secondary | ICD-10-CM | POA: Diagnosis not present

## 2020-03-19 DIAGNOSIS — K219 Gastro-esophageal reflux disease without esophagitis: Secondary | ICD-10-CM | POA: Diagnosis not present

## 2020-03-19 DIAGNOSIS — E7800 Pure hypercholesterolemia, unspecified: Secondary | ICD-10-CM | POA: Diagnosis not present

## 2020-03-19 DIAGNOSIS — U071 COVID-19: Secondary | ICD-10-CM | POA: Diagnosis not present

## 2020-03-19 DIAGNOSIS — N400 Enlarged prostate without lower urinary tract symptoms: Secondary | ICD-10-CM | POA: Diagnosis not present

## 2020-03-19 DIAGNOSIS — I10 Essential (primary) hypertension: Secondary | ICD-10-CM | POA: Diagnosis not present

## 2020-03-19 DIAGNOSIS — E1165 Type 2 diabetes mellitus with hyperglycemia: Secondary | ICD-10-CM | POA: Diagnosis not present

## 2020-03-20 DIAGNOSIS — I10 Essential (primary) hypertension: Secondary | ICD-10-CM | POA: Diagnosis not present

## 2020-03-20 DIAGNOSIS — E7800 Pure hypercholesterolemia, unspecified: Secondary | ICD-10-CM | POA: Diagnosis not present

## 2020-03-20 DIAGNOSIS — J9601 Acute respiratory failure with hypoxia: Secondary | ICD-10-CM | POA: Diagnosis not present

## 2020-03-20 DIAGNOSIS — U071 COVID-19: Secondary | ICD-10-CM | POA: Diagnosis not present

## 2020-03-20 DIAGNOSIS — N400 Enlarged prostate without lower urinary tract symptoms: Secondary | ICD-10-CM | POA: Diagnosis not present

## 2020-03-20 DIAGNOSIS — K219 Gastro-esophageal reflux disease without esophagitis: Secondary | ICD-10-CM | POA: Diagnosis not present

## 2020-03-20 DIAGNOSIS — J1282 Pneumonia due to coronavirus disease 2019: Secondary | ICD-10-CM | POA: Diagnosis not present

## 2020-03-20 DIAGNOSIS — E1165 Type 2 diabetes mellitus with hyperglycemia: Secondary | ICD-10-CM | POA: Diagnosis not present

## 2020-03-20 DIAGNOSIS — J440 Chronic obstructive pulmonary disease with acute lower respiratory infection: Secondary | ICD-10-CM | POA: Diagnosis not present

## 2020-03-22 ENCOUNTER — Other Ambulatory Visit: Payer: Self-pay | Admitting: Cardiology

## 2020-03-22 DIAGNOSIS — J1282 Pneumonia due to coronavirus disease 2019: Secondary | ICD-10-CM | POA: Diagnosis not present

## 2020-03-22 DIAGNOSIS — I10 Essential (primary) hypertension: Secondary | ICD-10-CM | POA: Diagnosis not present

## 2020-03-22 DIAGNOSIS — E7800 Pure hypercholesterolemia, unspecified: Secondary | ICD-10-CM | POA: Diagnosis not present

## 2020-03-22 DIAGNOSIS — N400 Enlarged prostate without lower urinary tract symptoms: Secondary | ICD-10-CM | POA: Diagnosis not present

## 2020-03-22 DIAGNOSIS — U071 COVID-19: Secondary | ICD-10-CM | POA: Diagnosis not present

## 2020-03-22 DIAGNOSIS — E1165 Type 2 diabetes mellitus with hyperglycemia: Secondary | ICD-10-CM | POA: Diagnosis not present

## 2020-03-22 DIAGNOSIS — J9601 Acute respiratory failure with hypoxia: Secondary | ICD-10-CM | POA: Diagnosis not present

## 2020-03-22 DIAGNOSIS — K219 Gastro-esophageal reflux disease without esophagitis: Secondary | ICD-10-CM | POA: Diagnosis not present

## 2020-03-22 DIAGNOSIS — J440 Chronic obstructive pulmonary disease with acute lower respiratory infection: Secondary | ICD-10-CM | POA: Diagnosis not present

## 2020-03-23 DIAGNOSIS — E1165 Type 2 diabetes mellitus with hyperglycemia: Secondary | ICD-10-CM | POA: Diagnosis not present

## 2020-03-23 DIAGNOSIS — J440 Chronic obstructive pulmonary disease with acute lower respiratory infection: Secondary | ICD-10-CM | POA: Diagnosis not present

## 2020-03-23 DIAGNOSIS — U071 COVID-19: Secondary | ICD-10-CM | POA: Diagnosis not present

## 2020-03-23 DIAGNOSIS — N400 Enlarged prostate without lower urinary tract symptoms: Secondary | ICD-10-CM | POA: Diagnosis not present

## 2020-03-23 DIAGNOSIS — E7800 Pure hypercholesterolemia, unspecified: Secondary | ICD-10-CM | POA: Diagnosis not present

## 2020-03-23 DIAGNOSIS — I10 Essential (primary) hypertension: Secondary | ICD-10-CM | POA: Diagnosis not present

## 2020-03-23 DIAGNOSIS — J1282 Pneumonia due to coronavirus disease 2019: Secondary | ICD-10-CM | POA: Diagnosis not present

## 2020-03-23 DIAGNOSIS — K219 Gastro-esophageal reflux disease without esophagitis: Secondary | ICD-10-CM | POA: Diagnosis not present

## 2020-03-23 DIAGNOSIS — J9601 Acute respiratory failure with hypoxia: Secondary | ICD-10-CM | POA: Diagnosis not present

## 2020-03-26 DIAGNOSIS — J1282 Pneumonia due to coronavirus disease 2019: Secondary | ICD-10-CM | POA: Diagnosis not present

## 2020-03-26 DIAGNOSIS — U071 COVID-19: Secondary | ICD-10-CM | POA: Diagnosis not present

## 2020-03-26 DIAGNOSIS — J440 Chronic obstructive pulmonary disease with acute lower respiratory infection: Secondary | ICD-10-CM | POA: Diagnosis not present

## 2020-03-26 DIAGNOSIS — J9601 Acute respiratory failure with hypoxia: Secondary | ICD-10-CM | POA: Diagnosis not present

## 2020-03-26 DIAGNOSIS — E1165 Type 2 diabetes mellitus with hyperglycemia: Secondary | ICD-10-CM | POA: Diagnosis not present

## 2020-03-26 DIAGNOSIS — E7800 Pure hypercholesterolemia, unspecified: Secondary | ICD-10-CM | POA: Diagnosis not present

## 2020-03-26 DIAGNOSIS — I10 Essential (primary) hypertension: Secondary | ICD-10-CM | POA: Diagnosis not present

## 2020-03-26 DIAGNOSIS — K219 Gastro-esophageal reflux disease without esophagitis: Secondary | ICD-10-CM | POA: Diagnosis not present

## 2020-03-26 DIAGNOSIS — N400 Enlarged prostate without lower urinary tract symptoms: Secondary | ICD-10-CM | POA: Diagnosis not present

## 2020-03-27 DIAGNOSIS — I10 Essential (primary) hypertension: Secondary | ICD-10-CM | POA: Diagnosis not present

## 2020-03-27 DIAGNOSIS — J9601 Acute respiratory failure with hypoxia: Secondary | ICD-10-CM | POA: Diagnosis not present

## 2020-03-27 DIAGNOSIS — U071 COVID-19: Secondary | ICD-10-CM | POA: Diagnosis not present

## 2020-03-27 DIAGNOSIS — E7800 Pure hypercholesterolemia, unspecified: Secondary | ICD-10-CM | POA: Diagnosis not present

## 2020-03-27 DIAGNOSIS — N400 Enlarged prostate without lower urinary tract symptoms: Secondary | ICD-10-CM | POA: Diagnosis not present

## 2020-03-27 DIAGNOSIS — J440 Chronic obstructive pulmonary disease with acute lower respiratory infection: Secondary | ICD-10-CM | POA: Diagnosis not present

## 2020-03-27 DIAGNOSIS — E1165 Type 2 diabetes mellitus with hyperglycemia: Secondary | ICD-10-CM | POA: Diagnosis not present

## 2020-03-27 DIAGNOSIS — K219 Gastro-esophageal reflux disease without esophagitis: Secondary | ICD-10-CM | POA: Diagnosis not present

## 2020-03-27 DIAGNOSIS — J1282 Pneumonia due to coronavirus disease 2019: Secondary | ICD-10-CM | POA: Diagnosis not present

## 2020-03-28 DIAGNOSIS — E1165 Type 2 diabetes mellitus with hyperglycemia: Secondary | ICD-10-CM | POA: Diagnosis not present

## 2020-03-28 DIAGNOSIS — N400 Enlarged prostate without lower urinary tract symptoms: Secondary | ICD-10-CM | POA: Diagnosis not present

## 2020-03-28 DIAGNOSIS — J1282 Pneumonia due to coronavirus disease 2019: Secondary | ICD-10-CM | POA: Diagnosis not present

## 2020-03-28 DIAGNOSIS — E7800 Pure hypercholesterolemia, unspecified: Secondary | ICD-10-CM | POA: Diagnosis not present

## 2020-03-28 DIAGNOSIS — J440 Chronic obstructive pulmonary disease with acute lower respiratory infection: Secondary | ICD-10-CM | POA: Diagnosis not present

## 2020-03-28 DIAGNOSIS — J9601 Acute respiratory failure with hypoxia: Secondary | ICD-10-CM | POA: Diagnosis not present

## 2020-03-28 DIAGNOSIS — K219 Gastro-esophageal reflux disease without esophagitis: Secondary | ICD-10-CM | POA: Diagnosis not present

## 2020-03-28 DIAGNOSIS — U071 COVID-19: Secondary | ICD-10-CM | POA: Diagnosis not present

## 2020-03-28 DIAGNOSIS — I10 Essential (primary) hypertension: Secondary | ICD-10-CM | POA: Diagnosis not present

## 2020-03-30 DIAGNOSIS — J9601 Acute respiratory failure with hypoxia: Secondary | ICD-10-CM | POA: Diagnosis not present

## 2020-03-30 DIAGNOSIS — N400 Enlarged prostate without lower urinary tract symptoms: Secondary | ICD-10-CM | POA: Diagnosis not present

## 2020-03-30 DIAGNOSIS — I10 Essential (primary) hypertension: Secondary | ICD-10-CM | POA: Diagnosis not present

## 2020-03-30 DIAGNOSIS — K219 Gastro-esophageal reflux disease without esophagitis: Secondary | ICD-10-CM | POA: Diagnosis not present

## 2020-03-30 DIAGNOSIS — E7800 Pure hypercholesterolemia, unspecified: Secondary | ICD-10-CM | POA: Diagnosis not present

## 2020-03-30 DIAGNOSIS — E1165 Type 2 diabetes mellitus with hyperglycemia: Secondary | ICD-10-CM | POA: Diagnosis not present

## 2020-03-30 DIAGNOSIS — J1282 Pneumonia due to coronavirus disease 2019: Secondary | ICD-10-CM | POA: Diagnosis not present

## 2020-03-30 DIAGNOSIS — J440 Chronic obstructive pulmonary disease with acute lower respiratory infection: Secondary | ICD-10-CM | POA: Diagnosis not present

## 2020-03-30 DIAGNOSIS — U071 COVID-19: Secondary | ICD-10-CM | POA: Diagnosis not present

## 2020-04-05 DIAGNOSIS — E7800 Pure hypercholesterolemia, unspecified: Secondary | ICD-10-CM | POA: Diagnosis not present

## 2020-04-05 DIAGNOSIS — K219 Gastro-esophageal reflux disease without esophagitis: Secondary | ICD-10-CM | POA: Diagnosis not present

## 2020-04-05 DIAGNOSIS — J440 Chronic obstructive pulmonary disease with acute lower respiratory infection: Secondary | ICD-10-CM | POA: Diagnosis not present

## 2020-04-05 DIAGNOSIS — E1165 Type 2 diabetes mellitus with hyperglycemia: Secondary | ICD-10-CM | POA: Diagnosis not present

## 2020-04-05 DIAGNOSIS — U071 COVID-19: Secondary | ICD-10-CM | POA: Diagnosis not present

## 2020-04-05 DIAGNOSIS — J1282 Pneumonia due to coronavirus disease 2019: Secondary | ICD-10-CM | POA: Diagnosis not present

## 2020-04-05 DIAGNOSIS — I10 Essential (primary) hypertension: Secondary | ICD-10-CM | POA: Diagnosis not present

## 2020-04-05 DIAGNOSIS — N400 Enlarged prostate without lower urinary tract symptoms: Secondary | ICD-10-CM | POA: Diagnosis not present

## 2020-04-05 DIAGNOSIS — J9601 Acute respiratory failure with hypoxia: Secondary | ICD-10-CM | POA: Diagnosis not present

## 2020-04-09 DIAGNOSIS — E1165 Type 2 diabetes mellitus with hyperglycemia: Secondary | ICD-10-CM | POA: Diagnosis not present

## 2020-04-09 DIAGNOSIS — J9601 Acute respiratory failure with hypoxia: Secondary | ICD-10-CM | POA: Diagnosis not present

## 2020-04-09 DIAGNOSIS — K219 Gastro-esophageal reflux disease without esophagitis: Secondary | ICD-10-CM | POA: Diagnosis not present

## 2020-04-09 DIAGNOSIS — I10 Essential (primary) hypertension: Secondary | ICD-10-CM | POA: Diagnosis not present

## 2020-04-09 DIAGNOSIS — J440 Chronic obstructive pulmonary disease with acute lower respiratory infection: Secondary | ICD-10-CM | POA: Diagnosis not present

## 2020-04-09 DIAGNOSIS — U071 COVID-19: Secondary | ICD-10-CM | POA: Diagnosis not present

## 2020-04-09 DIAGNOSIS — N400 Enlarged prostate without lower urinary tract symptoms: Secondary | ICD-10-CM | POA: Diagnosis not present

## 2020-04-09 DIAGNOSIS — E7800 Pure hypercholesterolemia, unspecified: Secondary | ICD-10-CM | POA: Diagnosis not present

## 2020-04-09 DIAGNOSIS — J1282 Pneumonia due to coronavirus disease 2019: Secondary | ICD-10-CM | POA: Diagnosis not present

## 2020-04-10 DIAGNOSIS — N400 Enlarged prostate without lower urinary tract symptoms: Secondary | ICD-10-CM | POA: Diagnosis not present

## 2020-04-10 DIAGNOSIS — J1282 Pneumonia due to coronavirus disease 2019: Secondary | ICD-10-CM | POA: Diagnosis not present

## 2020-04-10 DIAGNOSIS — U071 COVID-19: Secondary | ICD-10-CM | POA: Diagnosis not present

## 2020-04-10 DIAGNOSIS — I10 Essential (primary) hypertension: Secondary | ICD-10-CM | POA: Diagnosis not present

## 2020-04-10 DIAGNOSIS — J440 Chronic obstructive pulmonary disease with acute lower respiratory infection: Secondary | ICD-10-CM | POA: Diagnosis not present

## 2020-04-10 DIAGNOSIS — J9601 Acute respiratory failure with hypoxia: Secondary | ICD-10-CM | POA: Diagnosis not present

## 2020-04-10 DIAGNOSIS — E1165 Type 2 diabetes mellitus with hyperglycemia: Secondary | ICD-10-CM | POA: Diagnosis not present

## 2020-04-10 DIAGNOSIS — E7800 Pure hypercholesterolemia, unspecified: Secondary | ICD-10-CM | POA: Diagnosis not present

## 2020-04-10 DIAGNOSIS — K219 Gastro-esophageal reflux disease without esophagitis: Secondary | ICD-10-CM | POA: Diagnosis not present

## 2020-04-11 DIAGNOSIS — E1165 Type 2 diabetes mellitus with hyperglycemia: Secondary | ICD-10-CM | POA: Diagnosis not present

## 2020-04-11 DIAGNOSIS — J9601 Acute respiratory failure with hypoxia: Secondary | ICD-10-CM | POA: Diagnosis not present

## 2020-04-11 DIAGNOSIS — U071 COVID-19: Secondary | ICD-10-CM | POA: Diagnosis not present

## 2020-04-11 DIAGNOSIS — J440 Chronic obstructive pulmonary disease with acute lower respiratory infection: Secondary | ICD-10-CM | POA: Diagnosis not present

## 2020-04-11 DIAGNOSIS — N400 Enlarged prostate without lower urinary tract symptoms: Secondary | ICD-10-CM | POA: Diagnosis not present

## 2020-04-11 DIAGNOSIS — J1282 Pneumonia due to coronavirus disease 2019: Secondary | ICD-10-CM | POA: Diagnosis not present

## 2020-04-11 DIAGNOSIS — K219 Gastro-esophageal reflux disease without esophagitis: Secondary | ICD-10-CM | POA: Diagnosis not present

## 2020-04-11 DIAGNOSIS — E7800 Pure hypercholesterolemia, unspecified: Secondary | ICD-10-CM | POA: Diagnosis not present

## 2020-04-11 DIAGNOSIS — I10 Essential (primary) hypertension: Secondary | ICD-10-CM | POA: Diagnosis not present

## 2020-04-12 DIAGNOSIS — E1165 Type 2 diabetes mellitus with hyperglycemia: Secondary | ICD-10-CM | POA: Diagnosis not present

## 2020-04-12 DIAGNOSIS — E1142 Type 2 diabetes mellitus with diabetic polyneuropathy: Secondary | ICD-10-CM | POA: Diagnosis not present

## 2020-04-12 DIAGNOSIS — R600 Localized edema: Secondary | ICD-10-CM | POA: Diagnosis not present

## 2020-04-12 DIAGNOSIS — D509 Iron deficiency anemia, unspecified: Secondary | ICD-10-CM | POA: Diagnosis not present

## 2020-04-12 DIAGNOSIS — I10 Essential (primary) hypertension: Secondary | ICD-10-CM | POA: Diagnosis not present

## 2020-04-12 DIAGNOSIS — Z794 Long term (current) use of insulin: Secondary | ICD-10-CM | POA: Diagnosis not present

## 2020-04-12 DIAGNOSIS — Z125 Encounter for screening for malignant neoplasm of prostate: Secondary | ICD-10-CM | POA: Diagnosis not present

## 2020-04-12 DIAGNOSIS — M159 Polyosteoarthritis, unspecified: Secondary | ICD-10-CM | POA: Diagnosis not present

## 2020-04-12 DIAGNOSIS — E785 Hyperlipidemia, unspecified: Secondary | ICD-10-CM | POA: Diagnosis not present

## 2020-04-17 DIAGNOSIS — J1282 Pneumonia due to coronavirus disease 2019: Secondary | ICD-10-CM | POA: Diagnosis not present

## 2020-04-17 DIAGNOSIS — I10 Essential (primary) hypertension: Secondary | ICD-10-CM | POA: Diagnosis not present

## 2020-04-17 DIAGNOSIS — E7800 Pure hypercholesterolemia, unspecified: Secondary | ICD-10-CM | POA: Diagnosis not present

## 2020-04-17 DIAGNOSIS — J9601 Acute respiratory failure with hypoxia: Secondary | ICD-10-CM | POA: Diagnosis not present

## 2020-04-17 DIAGNOSIS — J440 Chronic obstructive pulmonary disease with acute lower respiratory infection: Secondary | ICD-10-CM | POA: Diagnosis not present

## 2020-04-17 DIAGNOSIS — E1165 Type 2 diabetes mellitus with hyperglycemia: Secondary | ICD-10-CM | POA: Diagnosis not present

## 2020-04-17 DIAGNOSIS — K219 Gastro-esophageal reflux disease without esophagitis: Secondary | ICD-10-CM | POA: Diagnosis not present

## 2020-04-17 DIAGNOSIS — N400 Enlarged prostate without lower urinary tract symptoms: Secondary | ICD-10-CM | POA: Diagnosis not present

## 2020-04-17 DIAGNOSIS — U071 COVID-19: Secondary | ICD-10-CM | POA: Diagnosis not present

## 2020-04-18 DIAGNOSIS — M109 Gout, unspecified: Secondary | ICD-10-CM | POA: Diagnosis not present

## 2020-04-18 DIAGNOSIS — E1142 Type 2 diabetes mellitus with diabetic polyneuropathy: Secondary | ICD-10-CM | POA: Diagnosis not present

## 2020-04-19 DIAGNOSIS — E7800 Pure hypercholesterolemia, unspecified: Secondary | ICD-10-CM | POA: Diagnosis not present

## 2020-04-19 DIAGNOSIS — J9601 Acute respiratory failure with hypoxia: Secondary | ICD-10-CM | POA: Diagnosis not present

## 2020-04-19 DIAGNOSIS — E1165 Type 2 diabetes mellitus with hyperglycemia: Secondary | ICD-10-CM | POA: Diagnosis not present

## 2020-04-19 DIAGNOSIS — K219 Gastro-esophageal reflux disease without esophagitis: Secondary | ICD-10-CM | POA: Diagnosis not present

## 2020-04-19 DIAGNOSIS — U071 COVID-19: Secondary | ICD-10-CM | POA: Diagnosis not present

## 2020-04-19 DIAGNOSIS — J440 Chronic obstructive pulmonary disease with acute lower respiratory infection: Secondary | ICD-10-CM | POA: Diagnosis not present

## 2020-04-19 DIAGNOSIS — I10 Essential (primary) hypertension: Secondary | ICD-10-CM | POA: Diagnosis not present

## 2020-04-19 DIAGNOSIS — N400 Enlarged prostate without lower urinary tract symptoms: Secondary | ICD-10-CM | POA: Diagnosis not present

## 2020-04-19 DIAGNOSIS — J1282 Pneumonia due to coronavirus disease 2019: Secondary | ICD-10-CM | POA: Diagnosis not present

## 2020-04-26 DIAGNOSIS — N400 Enlarged prostate without lower urinary tract symptoms: Secondary | ICD-10-CM | POA: Diagnosis not present

## 2020-04-26 DIAGNOSIS — K219 Gastro-esophageal reflux disease without esophagitis: Secondary | ICD-10-CM | POA: Diagnosis not present

## 2020-04-26 DIAGNOSIS — U071 COVID-19: Secondary | ICD-10-CM | POA: Diagnosis not present

## 2020-04-26 DIAGNOSIS — E1165 Type 2 diabetes mellitus with hyperglycemia: Secondary | ICD-10-CM | POA: Diagnosis not present

## 2020-04-26 DIAGNOSIS — J1282 Pneumonia due to coronavirus disease 2019: Secondary | ICD-10-CM | POA: Diagnosis not present

## 2020-04-26 DIAGNOSIS — I10 Essential (primary) hypertension: Secondary | ICD-10-CM | POA: Diagnosis not present

## 2020-04-26 DIAGNOSIS — J9601 Acute respiratory failure with hypoxia: Secondary | ICD-10-CM | POA: Diagnosis not present

## 2020-04-26 DIAGNOSIS — E7800 Pure hypercholesterolemia, unspecified: Secondary | ICD-10-CM | POA: Diagnosis not present

## 2020-04-26 DIAGNOSIS — J440 Chronic obstructive pulmonary disease with acute lower respiratory infection: Secondary | ICD-10-CM | POA: Diagnosis not present

## 2020-05-02 DIAGNOSIS — I10 Essential (primary) hypertension: Secondary | ICD-10-CM | POA: Diagnosis not present

## 2020-05-02 DIAGNOSIS — J9601 Acute respiratory failure with hypoxia: Secondary | ICD-10-CM | POA: Diagnosis not present

## 2020-05-02 DIAGNOSIS — J1282 Pneumonia due to coronavirus disease 2019: Secondary | ICD-10-CM | POA: Diagnosis not present

## 2020-05-02 DIAGNOSIS — E1165 Type 2 diabetes mellitus with hyperglycemia: Secondary | ICD-10-CM | POA: Diagnosis not present

## 2020-05-02 DIAGNOSIS — K219 Gastro-esophageal reflux disease without esophagitis: Secondary | ICD-10-CM | POA: Diagnosis not present

## 2020-05-02 DIAGNOSIS — E7800 Pure hypercholesterolemia, unspecified: Secondary | ICD-10-CM | POA: Diagnosis not present

## 2020-05-02 DIAGNOSIS — U071 COVID-19: Secondary | ICD-10-CM | POA: Diagnosis not present

## 2020-05-02 DIAGNOSIS — N400 Enlarged prostate without lower urinary tract symptoms: Secondary | ICD-10-CM | POA: Diagnosis not present

## 2020-05-02 DIAGNOSIS — J440 Chronic obstructive pulmonary disease with acute lower respiratory infection: Secondary | ICD-10-CM | POA: Diagnosis not present

## 2020-05-06 DIAGNOSIS — J1282 Pneumonia due to coronavirus disease 2019: Secondary | ICD-10-CM | POA: Diagnosis not present

## 2020-06-05 DIAGNOSIS — H25813 Combined forms of age-related cataract, bilateral: Secondary | ICD-10-CM | POA: Diagnosis not present

## 2020-06-05 DIAGNOSIS — J1282 Pneumonia due to coronavirus disease 2019: Secondary | ICD-10-CM | POA: Diagnosis not present

## 2020-06-05 DIAGNOSIS — Z01818 Encounter for other preprocedural examination: Secondary | ICD-10-CM | POA: Diagnosis not present

## 2020-06-05 DIAGNOSIS — H25812 Combined forms of age-related cataract, left eye: Secondary | ICD-10-CM | POA: Diagnosis not present

## 2020-06-05 DIAGNOSIS — H353131 Nonexudative age-related macular degeneration, bilateral, early dry stage: Secondary | ICD-10-CM | POA: Diagnosis not present

## 2020-06-26 DIAGNOSIS — Z01818 Encounter for other preprocedural examination: Secondary | ICD-10-CM | POA: Diagnosis not present

## 2020-06-26 DIAGNOSIS — I10 Essential (primary) hypertension: Secondary | ICD-10-CM | POA: Diagnosis not present

## 2020-06-26 DIAGNOSIS — E785 Hyperlipidemia, unspecified: Secondary | ICD-10-CM | POA: Diagnosis not present

## 2020-07-03 DIAGNOSIS — J449 Chronic obstructive pulmonary disease, unspecified: Secondary | ICD-10-CM | POA: Diagnosis not present

## 2020-07-03 DIAGNOSIS — Z794 Long term (current) use of insulin: Secondary | ICD-10-CM | POA: Diagnosis not present

## 2020-07-03 DIAGNOSIS — H25812 Combined forms of age-related cataract, left eye: Secondary | ICD-10-CM | POA: Diagnosis not present

## 2020-07-03 DIAGNOSIS — E119 Type 2 diabetes mellitus without complications: Secondary | ICD-10-CM | POA: Diagnosis not present

## 2020-07-03 DIAGNOSIS — F32A Depression, unspecified: Secondary | ICD-10-CM | POA: Diagnosis not present

## 2020-07-03 DIAGNOSIS — H259 Unspecified age-related cataract: Secondary | ICD-10-CM | POA: Diagnosis not present

## 2020-07-03 DIAGNOSIS — E785 Hyperlipidemia, unspecified: Secondary | ICD-10-CM | POA: Diagnosis not present

## 2020-07-03 DIAGNOSIS — F419 Anxiety disorder, unspecified: Secondary | ICD-10-CM | POA: Diagnosis not present

## 2020-07-03 DIAGNOSIS — I2510 Atherosclerotic heart disease of native coronary artery without angina pectoris: Secondary | ICD-10-CM | POA: Diagnosis not present

## 2020-07-03 DIAGNOSIS — I10 Essential (primary) hypertension: Secondary | ICD-10-CM | POA: Diagnosis not present

## 2020-07-05 ENCOUNTER — Ambulatory Visit: Payer: Medicare HMO | Admitting: Cardiology

## 2020-07-13 DIAGNOSIS — F3341 Major depressive disorder, recurrent, in partial remission: Secondary | ICD-10-CM | POA: Diagnosis not present

## 2020-07-13 DIAGNOSIS — E1142 Type 2 diabetes mellitus with diabetic polyneuropathy: Secondary | ICD-10-CM | POA: Diagnosis not present

## 2020-07-13 DIAGNOSIS — D509 Iron deficiency anemia, unspecified: Secondary | ICD-10-CM | POA: Diagnosis not present

## 2020-07-13 DIAGNOSIS — I10 Essential (primary) hypertension: Secondary | ICD-10-CM | POA: Diagnosis not present

## 2020-07-13 DIAGNOSIS — E785 Hyperlipidemia, unspecified: Secondary | ICD-10-CM | POA: Diagnosis not present

## 2020-07-13 DIAGNOSIS — Z6838 Body mass index (BMI) 38.0-38.9, adult: Secondary | ICD-10-CM | POA: Diagnosis not present

## 2020-07-13 DIAGNOSIS — R600 Localized edema: Secondary | ICD-10-CM | POA: Diagnosis not present

## 2020-07-13 DIAGNOSIS — I700 Atherosclerosis of aorta: Secondary | ICD-10-CM | POA: Diagnosis not present

## 2020-07-17 ENCOUNTER — Other Ambulatory Visit: Payer: Self-pay | Admitting: Cardiology

## 2020-07-17 NOTE — Telephone Encounter (Signed)
Refill sent to pharmacy.   

## 2020-07-20 DIAGNOSIS — E1142 Type 2 diabetes mellitus with diabetic polyneuropathy: Secondary | ICD-10-CM | POA: Diagnosis not present

## 2020-07-25 ENCOUNTER — Telehealth: Payer: Self-pay | Admitting: Cardiology

## 2020-07-25 NOTE — Telephone Encounter (Signed)
Pt c/o medication issue:  1. Name of Medication: traMADol-acetaminophen (ULTRACET) 37.5-325 MG tablet  2. How are you currently taking this medication (dosage and times per day)?   3. Are you having a reaction (difficulty breathing--STAT)?   4. What is your medication issue? Patient got a notification requesting a refill of this medication from [facility]. The patient was not sure what this medication was for and would like to talk to Dr. Harriet Masson about this before getting the medication refilled

## 2020-07-25 NOTE — Telephone Encounter (Signed)
Spoke to patient. Informed him this medication has not been prescribed by [place] and normally would not be given the kind of medication it is. Advised him to maybe check with pcp. He understood no further questions.

## 2020-07-26 ENCOUNTER — Encounter: Payer: Self-pay | Admitting: Cardiology

## 2020-07-26 ENCOUNTER — Ambulatory Visit (INDEPENDENT_AMBULATORY_CARE_PROVIDER_SITE_OTHER): Payer: Medicare HMO | Admitting: Cardiology

## 2020-07-26 ENCOUNTER — Other Ambulatory Visit: Payer: Self-pay

## 2020-07-26 VITALS — BP 158/72 | HR 78 | Ht 71.0 in | Wt 275.2 lb

## 2020-07-26 DIAGNOSIS — E785 Hyperlipidemia, unspecified: Secondary | ICD-10-CM

## 2020-07-26 DIAGNOSIS — I719 Aortic aneurysm of unspecified site, without rupture: Secondary | ICD-10-CM

## 2020-07-26 DIAGNOSIS — E1165 Type 2 diabetes mellitus with hyperglycemia: Secondary | ICD-10-CM | POA: Diagnosis not present

## 2020-07-26 DIAGNOSIS — I25118 Atherosclerotic heart disease of native coronary artery with other forms of angina pectoris: Secondary | ICD-10-CM

## 2020-07-26 DIAGNOSIS — E1142 Type 2 diabetes mellitus with diabetic polyneuropathy: Secondary | ICD-10-CM | POA: Diagnosis not present

## 2020-07-26 DIAGNOSIS — I10 Essential (primary) hypertension: Secondary | ICD-10-CM

## 2020-07-26 DIAGNOSIS — IMO0002 Reserved for concepts with insufficient information to code with codable children: Secondary | ICD-10-CM

## 2020-07-26 NOTE — Patient Instructions (Signed)
Medication Instructions:   Your physician recommends that you continue on your current medications as directed. Please refer to the Current Medication list given to you today.  *If you need a refill on your cardiac medications before your next appointment, please call your pharmacy*   Lab Work:  Your physician recommends that you return for lab work in:  TODAY: BMET, Trion, CBC  If you have labs (blood work) drawn today and your tests are completely normal, you will receive your results only by: MyChart Message (if you have MyChart) OR A paper copy in the mail If you have any lab test that is abnormal or we need to change your treatment, we will call you to review the results.   Testing/Procedures:  Your physician has requested that you have an echocardiogram. Echocardiography is a painless test that uses sound waves to create images of your heart. It provides your doctor with information about the size and shape of your heart and how well your heart's chambers and valves are working. This procedure takes approximately one hour. There are no restrictions for this procedure.    Follow-Up: At [facility], you and your health needs are our priority.  As part of our continuing mission to provide you with exceptional heart care, we have created designated Provider Care Teams.  These Care Teams include your primary Cardiologist (physician) and Advanced Practice Providers (APPs -  Physician Assistants and Nurse Practitioners) who all work together to provide you with the care you need, when you need it.  We recommend signing up for the patient portal called "MyChart".  Sign up information is provided on this After Visit Summary.  MyChart is used to connect with patients for Virtual Visits (Telemedicine).  Patients are able to view lab/test results, encounter notes, upcoming appointments, etc.  Non-urgent messages can be sent to your provider as well.   To learn more about what you can do with  MyChart, go to [email or web address].    Your next appointment:   6 month(s)  The format for your next appointment:   In Person   Other Instructions  Echocardiogram An echocardiogram is a test that uses sound waves (ultrasound) to produce images of the heart. Images from an echocardiogram can provide important information about: Heart size and shape. The size and thickness and movement of your heart's walls. Heart muscle function and strength. Heart valve function or if you have stenosis. Stenosis is when the heart valves are too narrow. If blood is flowing backward through the heart valves (regurgitation). A tumor or infectious growth around the heart valves. Areas of heart muscle that are not working well because of poor blood flow or injury from a heart attack. Aneurysm detection. An aneurysm is a weak or damaged part of an artery wall. The wall bulges out from the normal force of blood pumping through the body. Tell a health care provider about: Any allergies you have. All medicines you are taking, including vitamins, herbs, eye drops, creams, and over-the-counter medicines. Any blood disorders you have. Any surgeries you have had. Any medical conditions you have. Whether you are pregnant or may be pregnant. What are the risks? Generally, this is a safe test. However, problems may occur, including an allergic reaction to dye (contrast) that may be used during the test. What happens before the test? No specific preparation is needed. You may eat and drink normally. What happens during the test?  You will take off your clothes from the waist up and put  on a hospital gown. Electrodes or electrocardiogram (ECG)patches may be placed on your chest. The electrodes or patches are then connected to a device that monitors your heart rate and rhythm. You will lie down on a table for an ultrasound exam. A gel will be applied to your chest to help sound waves pass through your skin. A  handheld device, called a transducer, will be pressed against your chest and moved over your heart. The transducer produces sound waves that travel to your heart and bounce back (or "echo" back) to the transducer. These sound waves will be captured in real-time and changed into images of your heart that can be viewed on a video monitor. The images will be recorded on a computer and reviewed by your health care provider. You may be asked to change positions or hold your breath for a short time. This makes it easier to get different views or better views of your heart. In some cases, you may receive contrast through an IV in one of your veins. This can improve the quality of the pictures from your heart. The procedure may vary among health care providers and hospitals. What can I expect after the test? You may return to your normal, everyday life, including diet, activities, andmedicines, unless your health care provider tells you not to do that. Follow these instructions at home: It is up to you to get the results of your test. Ask your health care provider, or the department that is doing the test, when your results will be ready. Keep all follow-up visits. This is important. Summary An echocardiogram is a test that uses sound waves (ultrasound) to produce images of the heart. Images from an echocardiogram can provide important information about the size and shape of your heart, heart muscle function, heart valve function, and other possible heart problems. You do not need to do anything to prepare before this test. You may eat and drink normally. After the echocardiogram is completed, you may return to your normal, everyday life, unless your health care provider tells you not to do that. This information is not intended to replace advice given to you by your health care provider. Make sure you discuss any questions you have with your healthcare provider. Document Revised: 09/13/2019 Document  Reviewed: 09/13/2019 [employer] Patient Education  2022 [employer].

## 2020-07-26 NOTE — Progress Notes (Signed)
Cardiology Office Note:    Date:  07/26/2020   ID:  [name] Williams, DOB August 08, 1950, MRN [id number]  PCP:  Paulina Fusi, MD  Cardiologist:  Thomasene Ripple, DO  Electrophysiologist:  None   Referring MD: Paulina Fusi, MD   No chief complaint on file.  I am doing fine History of Present Illness:    [name] Williams is a 70 y.o. male with a hx of diabetes mellitus, hypertension, obesity, hyperlipidemia, coronary artery disease by coronary CTA and abnormal FFR CT.  The patient is planning cardiac catheterization he was noted to have a hemoglobin of 6.8 and he was sent to the hospital at which time he got 2 packed cells of red blood cells.     I saw the patient 06/08/2019 at that time he was supposed to see GI. In there interim he was suppose to see GI- Dr Chales Abrahams but called and reported that he would rather see someone in [place]. He then saw Dr. Charm Barges in [place] but he was referred back to Dr. Chales Abrahams for her endoscopy.  I did see the patient on July 08, 2019 at that time he had a scheduled procedure with Dr. Chales Abrahams on June 25.  He was able to get his colonoscopy with Dr. Chales Abrahams showed evidence of 2 small AVMs and diverticulosis.   I saw the patient on October 06, 2019 at that time he still have some shortness of breath are concerning for angina we proceeded for a left heart catheterization.  He did not undergo any PCI during that time it was recommended that we continue to increase his antianginal and use symptoms persisted despite antianginals PCI to the RCA will be considered.  I saw the patient in January 2021 at that time he appeared to be doing well from a cardiovascular standpoint, will continue his aspirin, atorvastatin as well as Imdur and beta-blocker.  He is here today for follow-up visit.  He tells me he has been doing well.  He did share with me that he had a hospitalization in February for COVID-19 infection as well as COVID-pneumonia.  He has recovered very well.  Past Medical  History:  Diagnosis Date   Anemia 05/18/2019   Anxiety disorder    Blood transfusion without reported diagnosis    BPH (benign prostatic hyperplasia)    Cataract    Colon polyps    DDD (degenerative disc disease), cervical    Depression    Dyslipidemia 05/29/2017   Essential hypertension 05/29/2017   Generalized osteoarthritis of multiple sites    Gout    Hyperlipidemia    Hypertension, benign    Laryngopharyngeal reflux    Morbid obesity (HCC) 11/26/2018   Shortness of breath 07/17/2017   Sinus arrhythmia 11/26/2018   Swelling 05/29/2017   Tobacco abuse    Type 2 diabetes mellitus without complication, without long-term current use of insulin (HCC) 05/29/2017   Uncontrolled type 2 diabetes mellitus with peripheral neuropathy (HCC)     Past Surgical History:  Procedure Laterality Date   HERNIA REPAIR  1998   Abdominal   LEFT HEART CATH AND CORONARY ANGIOGRAPHY N/A 10/13/2019   Procedure: LEFT HEART CATH AND CORONARY ANGIOGRAPHY;  Surgeon: Yvonne Kendall, MD;  Location: [facility];  Service: Cardiovascular;  Laterality: N/A;   MINOR HEMORRHOIDECTOMY  1998   NASAL SINUS SURGERY  1978   SHOULDER SURGERY Left    X2   TONSILLECTOMY  1967    Current Medications: Current Meds  Medication  Sig   allopurinol (ZYLOPRIM) 300 MG tablet Take 300 mg by mouth daily.   amLODipine (NORVASC) 5 MG tablet Take 5 mg by mouth daily.    aspirin EC 81 MG tablet Take 81 mg by mouth daily. Swallow whole.   atorvastatin (LIPITOR) 80 MG tablet Take 80 mg by mouth at bedtime.    cetirizine (ZYRTEC) 10 MG tablet Take 10 mg by mouth daily.    citalopram (CELEXA) 20 MG tablet Take 20 mg by mouth daily.   docusate sodium (COLACE) 100 MG capsule Take 100 mg by mouth daily.   doxycycline (VIBRA-TABS) 100 MG tablet Take 100 mg by mouth 2 (two) times daily.   FEROSUL 325 (65 Fe) MG tablet TAKE 1 TABLET EVERY OTHER DAY   furosemide (LASIX) 40 MG tablet Take 1 tablet (40 mg total) by mouth 2 (two) times  daily.   gemfibrozil (LOPID) 600 MG tablet Take 600 mg by mouth 2 (two) times daily before a meal.   glimepiride (AMARYL) 4 MG tablet Take 4 mg by mouth 2 (two) times daily.   isosorbide mononitrate (IMDUR) 30 MG 24 hr tablet TAKE 1 TABLET EVERY DAY   losartan (COZAAR) 100 MG tablet Take 100 mg by mouth at bedtime.    magnesium oxide (MAG-OX) 400 (241.3 Mg) MG tablet TAKE 1 TABLET IN THE MORNING AND AT BEDTIME   metFORMIN (GLUCOPHAGE) 1000 MG tablet Take 1 tablet (1,000 mg total) by mouth 2 (two) times daily with a meal.   metoprolol succinate (TOPROL-XL) 25 MG 24 hr tablet TAKE 1/2 TABLET EVERY DAY   montelukast (SINGULAIR) 10 MG tablet Take 10 mg by mouth daily.    naphazoline-pheniramine (NAPHCON-A) 0.025-0.3 % ophthalmic solution Place 1 drop into both eyes 3 (three) times daily as needed for eye irritation (dry/irritated/allergy eyes.).    nitroGLYCERIN (NITROSTAT) 0.4 MG SL tablet Place 1 tablet (0.4 mg total) under the tongue every 5 (five) minutes as needed for chest pain. (Patient taking differently: Place 0.4 mg under the tongue every 5 (five) minutes x 3 doses as needed for chest pain.)   NOVOLOG FLEXPEN 100 UNIT/ML FlexPen Inject 20 Units into the skin in the morning, at noon, and at bedtime.    Omega-3 Fatty Acids (OMEGA 3 PO) Take 2 capsules by mouth daily. Omega XL   omeprazole (PRILOSEC) 40 MG capsule Take 40 mg by mouth at bedtime.   pantoprazole (PROTONIX) 40 MG tablet Take 1 tablet (40 mg total) by mouth daily. (Patient taking differently: Take 40 mg by mouth daily before breakfast.)   potassium chloride SA (KLOR-CON) 20 MEQ tablet TAKE 1 TABLET EVERY DAY   tamsulosin (FLOMAX) 0.4 MG CAPS capsule Take 0.4 mg by mouth daily.   traMADol-acetaminophen (ULTRACET) 37.5-325 MG tablet    TRESIBA FLEXTOUCH 100 UNIT/ML FlexTouch Pen Inject 60 Units into the skin daily.      Allergies:   Pseudoephedrine, Penicillins, and Tetracyclines & related   Social History   Socioeconomic  History   Marital status: Married    Spouse name: Not on file   Number of children: Not on file   Years of education: Not on file   Highest education level: Not on file  Occupational History   Not on file  Tobacco Use   Smoking status: Former    Pack years: 0.00   Smokeless tobacco: Former    Types: Chew  Substance and Sexual Activity   Alcohol use: Not Currently   Drug use: Never   Sexual activity: Not  on file  Other Topics Concern   Not on file  Social History Narrative   Not on file   Social Determinants of Health   Financial Resource Strain: Not on file  Food Insecurity: Not on file  Transportation Needs: Not on file  Physical Activity: Not on file  Stress: Not on file  Social Connections: Not on file     Family History: The patient's family history includes Alcohol abuse in his father; Diabetes in his brother; Lung cancer in his father; Stomach cancer in his maternal grandmother and paternal grandmother; Stroke in his father. There is no history of Colon cancer, Esophageal cancer, or Rectal cancer.  ROS:   Review of Systems  Constitution: Negative for decreased appetite, fever and weight gain.  HENT: Negative for congestion, ear discharge, hoarse voice and sore throat.   Eyes: Negative for discharge, redness, vision loss in right eye and visual halos.  Cardiovascular: Negative for chest pain, dyspnea on exertion, leg swelling, orthopnea and palpitations.  Respiratory: Negative for cough, hemoptysis, shortness of breath and snoring.   Endocrine: Negative for heat intolerance and polyphagia.  Hematologic/Lymphatic: Negative for bleeding problem. Does not bruise/bleed easily.  Skin: Negative for flushing, nail changes, rash and suspicious lesions.  Musculoskeletal: Negative for arthritis, joint pain, muscle cramps, myalgias, neck pain and stiffness.  Gastrointestinal: Negative for abdominal pain, bowel incontinence, diarrhea and excessive appetite.  Genitourinary:  Negative for decreased libido, genital sores and incomplete emptying.  Neurological: Negative for brief paralysis, focal weakness, headaches and loss of balance.  Psychiatric/Behavioral: Negative for altered mental status, depression and suicidal ideas.  Allergic/Immunologic: Negative for HIV exposure and persistent infections.    EKGs/Labs/Other Studies Reviewed:    The following studies were reviewed today:   EKG: None today  [facility] September 2021 Conclusions: Moderate to severe, non-critical two vessel coronary artery disease with sequential 50% proximal/mid LAD stenoses as well as tandem 60-70% proximal RCA lesions.  There is also a 70% stenosis involving rPL1. Grossly normal left ventricular systolic function with moderately elevated filling pressure (LVEDP 25-30 mmHg). Tortuous right subclavian artery limiting catheter manipulation.  Consider use of a long radial sheath versus alternate access if catheterization is needed in the future.   Recommendations: Given recent severe anemia and improving symptoms with rising hemoglobin, as well as moderately elevated LVEDP, I favor optimization of medical therapy.  I will increase furosemide to 40 mg BID and add isosorbide mononitrate 30 mg daily.  If Mr. Newberg has refractory symptoms despite optimal medical therapy, PCI to the RCA could be considered.  He should be challenged with dual antiplatelet therapy before proceeding with PCI to ensure that he does not experience recurrent GI bleeding. Continue aggressive secondary prevention of coronary artery disease.  Echocardiogram November 2020 IMPRESSIONS   1. Left ventricular ejection fraction, by visual estimation, is 60 to  65%. The left ventricle has normal function. There is moderately increased  left ventricular hypertrophy.   2. Left ventricular diastolic parameters are consistent with Grade I  diastolic dysfunction (impaired relaxation).   3. Left atrial size was mildly dilated.   4.  The aortic valve is normal in structure. Aortic valve regurgitation is  not visualized. Mild to moderate aortic valve sclerosis/calcification  without any evidence of aortic stenosis.   5. The pulmonic valve was normal in structure. Pulmonic valve  regurgitation is not visualized.   6. 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   FINDINGS   Left Ventricle: Left ventricular  ejection fraction, by visual estimation,  is 60 to 65%. The left ventricle has normal function. There is moderately  increased left ventricular hypertrophy. Left ventricular diastolic  parameters are consistent with Grade I   diastolic dysfunction (impaired relaxation). Normal left atrial pressure.   Right Ventricle: The right ventricular size is normal. No increase in  right ventricular wall thickness. Global RV systolic function is has  normal systolic function.   Left Atrium: Left atrial size was mildly dilated.   Right Atrium: Right atrial size was normal in size   Pericardium: There is no evidence of pericardial effusion.   Mitral Valve: The mitral valve is normal in structure. No evidence of  mitral valve stenosis by observation. No evidence of mitral valve  regurgitation.   Tricuspid Valve: The tricuspid valve is normal in structure. Tricuspid  valve regurgitation is not demonstrated.   Aortic Valve: The aortic valve is normal in structure. Aortic valve  regurgitation is not visualized. Mild to moderate aortic valve  sclerosis/calcification is present, without any evidence of aortic  stenosis.   Pulmonic Valve: The pulmonic valve was normal in structure. Pulmonic valve  regurgitation is not visualized.   Aorta: The aortic root, ascending aorta and aortic arch are all  structurally normal, with no evidence of dilitation or obstruction. There  is moderate dilatation of the ascending aorta measuring 41 mm.   Venous: The inferior vena cava is normal in size with greater than  50%  respiratory variability, suggesting right atrial pressure of 3 mmHg.   IAS/Shunts: No atrial level shunt detected by color flow Doppler. No  ventricular septal defect is seen or detected. There is no evidence of an  atrial septal defect.       Recent Labs: 01/05/2020: BUN 20; Creatinine, Ser 0.90; Hemoglobin 10.9; Magnesium 1.4; Platelets 298; Potassium 4.2; Sodium 137  Recent Lipid Panel No results found for: CHOL, TRIG, HDL, CHOLHDL, VLDL, LDLCALC, LDLDIRECT  Physical Exam:    VS:  BP (!) 158/72   Pulse 78   Ht 5\' 11"  (1.803 m)   Wt 275 lb 3.2 oz (124.8 kg)   SpO2 94%   BMI 38.38 kg/m     Wt Readings from Last 3 Encounters:  07/26/20 275 lb 3.2 oz (124.8 kg)  01/05/20 276 lb 12.8 oz (125.6 kg)  10/13/19 275 lb (124.7 kg)     GEN: Well nourished, well developed in no acute distress HEENT: Normal NECK: No JVD; No carotid bruits LYMPHATICS: No lymphadenopathy CARDIAC: S1S2 noted,RRR, no murmurs, rubs, gallops RESPIRATORY:  Clear to auscultation without rales, wheezing or rhonchi  ABDOMEN: Soft, non-tender, non-distended, +bowel sounds, no guarding. EXTREMITIES: No edema, No cyanosis, no clubbing MUSCULOSKELETAL:  No deformity  SKIN: Warm and dry NEUROLOGIC:  Alert and oriented x 3, non-focal PSYCHIATRIC:  Normal affect, good insight  ASSESSMENT:    1. Coronary artery disease of native artery of native heart with stable angina pectoris (HCC)   2. Essential hypertension   3. Uncontrolled type 2 diabetes mellitus with peripheral neuropathy (HCC)   4. Morbid obesity (HCC)   5. Dyslipidemia   6. Aortic aneurysm without rupture, unspecified portion of aorta (HCC)    PLAN:     1.  His blood pressure is slightly elevated in the office today.  We will continue to monitor this.  This is an isolated elevated reading and advised the patient take his blood pressure daily and send this information to me in the next 2 weeks.  In the meantime  he has dilated aortic aneurysm  we will repeat an echocardiogram to reassess his proximal ascending aorta. No angina symptoms continue patient on his antiplatelet therapy and statin. This is being managed by his primary care doctor.  No adjustments for antidiabetic medications were made today.  We will get blood work for BMP, CBC and mag.  The patient is in agreement with the above plan. The patient left the office in stable condition.  The patient will follow up in 6 months or sooner if needed   Medication Adjustments/Labs and Tests Ordered: Current medicines are reviewed at length with the patient today.  Concerns regarding medicines are outlined above.  Orders Placed This Encounter  Procedures   Basic metabolic panel   Magnesium   CBC with Differential/Platelet   ECHOCARDIOGRAM COMPLETE    No orders of the defined types were placed in this encounter.   Patient Instructions  Medication Instructions:   Your physician recommends that you continue on your current medications as directed. Please refer to the Current Medication list given to you today.  *If you need a refill on your cardiac medications before your next appointment, please call your pharmacy*   Lab Work:  Your physician recommends that you return for lab work in:  TODAY: BMET, Mag, CBC  If you have labs (blood work) drawn today and your tests are completely normal, you will receive your results only by: MyChart Message (if you have MyChart) OR A paper copy in the mail If you have any lab test that is abnormal or we need to change your treatment, we will call you to review the results.   Testing/Procedures:  Your physician has requested that you have an echocardiogram. Echocardiography is a painless test that uses sound waves to create images of your heart. It provides your doctor with information about the size and shape of your heart and how well your heart's chambers and valves are working. This procedure takes approximately one hour. There  are no restrictions for this procedure.    Follow-Up: At [facility], you and your health needs are our priority.  As part of our continuing mission to provide you with exceptional heart care, we have created designated Provider Care Teams.  These Care Teams include your primary Cardiologist (physician) and Advanced Practice Providers (APPs -  Physician Assistants and Nurse Practitioners) who all work together to provide you with the care you need, when you need it.  We recommend signing up for the patient portal called "MyChart".  Sign up information is provided on this After Visit Summary.  MyChart is used to connect with patients for Virtual Visits (Telemedicine).  Patients are able to view lab/test results, encounter notes, upcoming appointments, etc.  Non-urgent messages can be sent to your provider as well.   To learn more about what you can do with MyChart, go to [email or web address].    Your next appointment:   6 month(s)  The format for your next appointment:   In Person   Other Instructions  Echocardiogram An echocardiogram is a test that uses sound waves (ultrasound) to produce images of the heart. Images from an echocardiogram can provide important information about: Heart size and shape. The size and thickness and movement of your heart's walls. Heart muscle function and strength. Heart valve function or if you have stenosis. Stenosis is when the heart valves are too narrow. If blood is flowing backward through the heart valves (regurgitation). A tumor or infectious growth around the heart valves. Areas of  heart muscle that are not working well because of poor blood flow or injury from a heart attack. Aneurysm detection. An aneurysm is a weak or damaged part of an artery wall. The wall bulges out from the normal force of blood pumping through the body. Tell a health care provider about: Any allergies you have. All medicines you are taking, including vitamins, herbs,  eye drops, creams, and over-the-counter medicines. Any blood disorders you have. Any surgeries you have had. Any medical conditions you have. Whether you are pregnant or may be pregnant. What are the risks? Generally, this is a safe test. However, problems may occur, including an allergic reaction to dye (contrast) that may be used during the test. What happens before the test? No specific preparation is needed. You may eat and drink normally. What happens during the test?  You will take off your clothes from the waist up and put on a hospital gown. Electrodes or electrocardiogram (ECG)patches may be placed on your chest. The electrodes or patches are then connected to a device that monitors your heart rate and rhythm. You will lie down on a table for an ultrasound exam. A gel will be applied to your chest to help sound waves pass through your skin. A handheld device, called a transducer, will be pressed against your chest and moved over your heart. The transducer produces sound waves that travel to your heart and bounce back (or "echo" back) to the transducer. These sound waves will be captured in real-time and changed into images of your heart that can be viewed on a video monitor. The images will be recorded on a computer and reviewed by your health care provider. You may be asked to change positions or hold your breath for a short time. This makes it easier to get different views or better views of your heart. In some cases, you may receive contrast through an IV in one of your veins. This can improve the quality of the pictures from your heart. The procedure may vary among health care providers and hospitals. What can I expect after the test? You may return to your normal, everyday life, including diet, activities, andmedicines, unless your health care provider tells you not to do that. Follow these instructions at home: It is up to you to get the results of your test. Ask your health care  provider, or the department that is doing the test, when your results will be ready. Keep all follow-up visits. This is important. Summary An echocardiogram is a test that uses sound waves (ultrasound) to produce images of the heart. Images from an echocardiogram can provide important information about the size and shape of your heart, heart muscle function, heart valve function, and other possible heart problems. You do not need to do anything to prepare before this test. You may eat and drink normally. After the echocardiogram is completed, you may return to your normal, everyday life, unless your health care provider tells you not to do that. This information is not intended to replace advice given to you by your health care provider. Make sure you discuss any questions you have with your healthcare provider. Document Revised: 09/13/2019 Document Reviewed: 09/13/2019 Elsevier Patient Education  2022 Elsevier Inc.    Adopting a Healthy Lifestyle.  Know what a healthy weight is for you (roughly BMI <25) and aim to maintain this   Aim for 7+ servings of fruits and vegetables daily   65-80+ fluid ounces of water or unsweet tea for  healthy kidneys   Limit to max 1 drink of alcohol per day; avoid smoking/tobacco   Limit animal fats in diet for cholesterol and heart health - choose grass fed whenever available   Avoid highly processed foods, and foods high in saturated/trans fats   Aim for low stress - take time to unwind and care for your mental health   Aim for 150 min of moderate intensity exercise weekly for heart health, and weights twice weekly for bone health   Aim for 7-9 hours of sleep daily   When it comes to diets, agreement about the perfect plan isnt easy to find, even among the experts. Experts at the [facility] [employer] developed an idea known as the Healthy Eating Plate. Just imagine a plate divided into logical, healthy portions.   The emphasis is on diet  quality:   Load up on vegetables and fruits - one-half of your plate: Aim for color and variety, and remember that potatoes dont count.   Go for whole grains - one-quarter of your plate: Whole wheat, barley, wheat berries, quinoa, oats, brown rice, and foods made with them. If you want pasta, go with whole wheat pasta.   Protein power - one-quarter of your plate: Fish, chicken, beans, and nuts are all healthy, versatile protein sources. Limit red meat.   The diet, however, does go beyond the plate, offering a few other suggestions.   Use healthy plant oils, such as olive, canola, soy, corn, sunflower and peanut. Check the labels, and avoid partially hydrogenated oil, which have unhealthy trans fats.   If youre thirsty, drink water. Coffee and tea are good in moderation, but skip sugary drinks and limit milk and dairy products to one or two daily servings.   The type of carbohydrate in the diet is more important than the amount. Some sources of carbohydrates, such as vegetables, fruits, whole grains, and beans-are healthier than others.   Finally, stay active  Signed, Thomasene Ripple, DO  07/26/2020 2:37 PM    [facility]   Medication Adjustments/Labs and Tests Ordered: Current medicines are reviewed at length with the patient today.  Concerns regarding medicines are outlined above.  Orders Placed This Encounter  Procedures   Basic metabolic panel   Magnesium   CBC with Differential/Platelet   ECHOCARDIOGRAM COMPLETE    No orders of the defined types were placed in this encounter.   Patient Instructions  Medication Instructions:   Your physician recommends that you continue on your current medications as directed. Please refer to the Current Medication list given to you today.  *If you need a refill on your cardiac medications before your next appointment, please call your pharmacy*   Lab Work:  Your physician recommends that you return for lab work  in:  TODAY: BMET, Mag, CBC  If you have labs (blood work) drawn today and your tests are completely normal, you will receive your results only by: MyChart Message (if you have MyChart) OR A paper copy in the mail If you have any lab test that is abnormal or we need to change your treatment, we will call you to review the results.   Testing/Procedures:  Your physician has requested that you have an echocardiogram. Echocardiography is a painless test that uses sound waves to create images of your heart. It provides your doctor with information about the size and shape of your heart and how well your heart's chambers and valves are working. This procedure takes  approximately one hour. There are no restrictions for this procedure.    Follow-Up: At [facility], you and your health needs are our priority.  As part of our continuing mission to provide you with exceptional heart care, we have created designated Provider Care Teams.  These Care Teams include your primary Cardiologist (physician) and Advanced Practice Providers (APPs -  Physician Assistants and Nurse Practitioners) who all work together to provide you with the care you need, when you need it.  We recommend signing up for the patient portal called "MyChart".  Sign up information is provided on this After Visit Summary.  MyChart is used to connect with patients for Virtual Visits (Telemedicine).  Patients are able to view lab/test results, encounter notes, upcoming appointments, etc.  Non-urgent messages can be sent to your provider as well.   To learn more about what you can do with MyChart, go to [email or web address].    Your next appointment:   6 month(s)  The format for your next appointment:   In Person   Other Instructions  Echocardiogram An echocardiogram is a test that uses sound waves (ultrasound) to produce images of the heart. Images from an echocardiogram can provide important information about: Heart size and  shape. The size and thickness and movement of your heart's walls. Heart muscle function and strength. Heart valve function or if you have stenosis. Stenosis is when the heart valves are too narrow. If blood is flowing backward through the heart valves (regurgitation). A tumor or infectious growth around the heart valves. Areas of heart muscle that are not working well because of poor blood flow or injury from a heart attack. Aneurysm detection. An aneurysm is a weak or damaged part of an artery wall. The wall bulges out from the normal force of blood pumping through the body. Tell a health care provider about: Any allergies you have. All medicines you are taking, including vitamins, herbs, eye drops, creams, and over-the-counter medicines. Any blood disorders you have. Any surgeries you have had. Any medical conditions you have. Whether you are pregnant or may be pregnant. What are the risks? Generally, this is a safe test. However, problems may occur, including an allergic reaction to dye (contrast) that may be used during the test. What happens before the test? No specific preparation is needed. You may eat and drink normally. What happens during the test?  You will take off your clothes from the waist up and put on a hospital gown. Electrodes or electrocardiogram (ECG)patches may be placed on your chest. The electrodes or patches are then connected to a device that monitors your heart rate and rhythm. You will lie down on a table for an ultrasound exam. A gel will be applied to your chest to help sound waves pass through your skin. A handheld device, called a transducer, will be pressed against your chest and moved over your heart. The transducer produces sound waves that travel to your heart and bounce back (or "echo" back) to the transducer. These sound waves will be captured in real-time and changed into images of your heart that can be viewed on a video monitor. The images will be  recorded on a computer and reviewed by your health care provider. You may be asked to change positions or hold your breath for a short time. This makes it easier to get different views or better views of your heart. In some cases, you may receive contrast through an IV in one of your veins. This can  improve the quality of the pictures from your heart. The procedure may vary among health care providers and hospitals. What can I expect after the test? You may return to your normal, everyday life, including diet, activities, andmedicines, unless your health care provider tells you not to do that. Follow these instructions at home: It is up to you to get the results of your test. Ask your health care provider, or the department that is doing the test, when your results will be ready. Keep all follow-up visits. This is important. Summary An echocardiogram is a test that uses sound waves (ultrasound) to produce images of the heart. Images from an echocardiogram can provide important information about the size and shape of your heart, heart muscle function, heart valve function, and other possible heart problems. You do not need to do anything to prepare before this test. You may eat and drink normally. After the echocardiogram is completed, you may return to your normal, everyday life, unless your health care provider tells you not to do that. This information is not intended to replace advice given to you by your health care provider. Make sure you discuss any questions you have with your healthcare provider. Document Revised: 09/13/2019 Document Reviewed: 09/13/2019 Elsevier Patient Education  2022 Elsevier Inc.    Adopting a Healthy Lifestyle.  Know what a healthy weight is for you (roughly BMI <25) and aim to maintain this   Aim for 7+ servings of fruits and vegetables daily   65-80+ fluid ounces of water or unsweet tea for healthy kidneys   Limit to max 1 drink of alcohol per day; avoid  smoking/tobacco   Limit animal fats in diet for cholesterol and heart health - choose grass fed whenever available   Avoid highly processed foods, and foods high in saturated/trans fats   Aim for low stress - take time to unwind and care for your mental health   Aim for 150 min of moderate intensity exercise weekly for heart health, and weights twice weekly for bone health   Aim for 7-9 hours of sleep daily   When it comes to diets, agreement about the perfect plan isnt easy to find, even among the experts. Experts at the [facility] [employer] developed an idea known as the Healthy Eating Plate. Just imagine a plate divided into logical, healthy portions.   The emphasis is on diet quality:   Load up on vegetables and fruits - one-half of your plate: Aim for color and variety, and remember that potatoes dont count.   Go for whole grains - one-quarter of your plate: Whole wheat, barley, wheat berries, quinoa, oats, brown rice, and foods made with them. If you want pasta, go with whole wheat pasta.   Protein power - one-quarter of your plate: Fish, chicken, beans, and nuts are all healthy, versatile protein sources. Limit red meat.   The diet, however, does go beyond the plate, offering a few other suggestions.   Use healthy plant oils, such as olive, canola, soy, corn, sunflower and peanut. Check the labels, and avoid partially hydrogenated oil, which have unhealthy trans fats.   If youre thirsty, drink water. Coffee and tea are good in moderation, but skip sugary drinks and limit milk and dairy products to one or two daily servings.   The type of carbohydrate in the diet is more important than the amount. Some sources of carbohydrates, such as vegetables, fruits, whole grains, and beans-are healthier than others.   Finally, stay  active  Signed, Thomasene Ripple, DO  07/26/2020 2:37 PM    [place], [facility]

## 2020-07-27 LAB — BASIC METABOLIC PANEL
BUN/Creatinine Ratio: 21 (ref 10–24)
BUN: 18 mg/dL (ref 8–27)
CO2: 22 mmol/L (ref 20–29)
Calcium: 9.9 mg/dL (ref 8.6–10.2)
Chloride: 100 mmol/L (ref 96–106)
Creatinine, Ser: 0.87 mg/dL (ref 0.76–1.27)
Glucose: 206 mg/dL — ABNORMAL HIGH (ref 65–99)
Potassium: 4.7 mmol/L (ref 3.5–5.2)
Sodium: 139 mmol/L (ref 134–144)
eGFR: 92 mL/min/{1.73_m2} (ref >59)

## 2020-07-27 LAB — MAGNESIUM: Magnesium: 1.6 mg/dL (ref 1.6–2.3)

## 2020-07-27 LAB — CBC WITH DIFFERENTIAL/PLATELET
Basophils Absolute: 0.1 10*3/uL (ref 0.0–0.2)
Basos: 1 %
EOS (ABSOLUTE): 0.6 10*3/uL — ABNORMAL HIGH (ref 0.0–0.4)
Eos: 6 %
Hematocrit: 33.9 % — ABNORMAL LOW (ref 37.5–51.0)
Hemoglobin: 10.2 g/dL — ABNORMAL LOW (ref 13.0–17.7)
Immature Grans (Abs): 0.1 10*3/uL (ref 0.0–0.1)
Immature Granulocytes: 1 %
Lymphocytes Absolute: 2.1 10*3/uL (ref 0.7–3.1)
Lymphs: 23 %
MCH: 25.4 pg — ABNORMAL LOW (ref 26.6–33.0)
MCHC: 30.1 g/dL — ABNORMAL LOW (ref 31.5–35.7)
MCV: 85 fL (ref 79–97)
Monocytes Absolute: 0.8 10*3/uL (ref 0.1–0.9)
Monocytes: 9 %
Neutrophils Absolute: 5.8 10*3/uL (ref 1.4–7.0)
Neutrophils: 60 %
Platelets: 362 10*3/uL (ref 150–450)
RBC: 4.01 x10E6/uL — ABNORMAL LOW (ref 4.14–5.80)
RDW: 16.4 % — ABNORMAL HIGH (ref 11.6–15.4)
WBC: 9.5 10*3/uL (ref 3.4–10.8)

## 2020-07-31 DIAGNOSIS — H25811 Combined forms of age-related cataract, right eye: Secondary | ICD-10-CM | POA: Diagnosis not present

## 2020-07-31 DIAGNOSIS — E119 Type 2 diabetes mellitus without complications: Secondary | ICD-10-CM | POA: Diagnosis not present

## 2020-07-31 DIAGNOSIS — H25813 Combined forms of age-related cataract, bilateral: Secondary | ICD-10-CM | POA: Diagnosis not present

## 2020-07-31 DIAGNOSIS — E1136 Type 2 diabetes mellitus with diabetic cataract: Secondary | ICD-10-CM | POA: Diagnosis not present

## 2020-07-31 DIAGNOSIS — Z8616 Personal history of COVID-19: Secondary | ICD-10-CM | POA: Diagnosis not present

## 2020-07-31 DIAGNOSIS — I10 Essential (primary) hypertension: Secondary | ICD-10-CM | POA: Diagnosis not present

## 2020-07-31 DIAGNOSIS — H259 Unspecified age-related cataract: Secondary | ICD-10-CM | POA: Diagnosis not present

## 2020-07-31 DIAGNOSIS — I2510 Atherosclerotic heart disease of native coronary artery without angina pectoris: Secondary | ICD-10-CM | POA: Diagnosis not present

## 2020-07-31 DIAGNOSIS — Z87891 Personal history of nicotine dependence: Secondary | ICD-10-CM | POA: Diagnosis not present

## 2020-07-31 DIAGNOSIS — K219 Gastro-esophageal reflux disease without esophagitis: Secondary | ICD-10-CM | POA: Diagnosis not present

## 2020-07-31 DIAGNOSIS — J449 Chronic obstructive pulmonary disease, unspecified: Secondary | ICD-10-CM | POA: Diagnosis not present

## 2020-07-31 DIAGNOSIS — Z794 Long term (current) use of insulin: Secondary | ICD-10-CM | POA: Diagnosis not present

## 2020-08-13 ENCOUNTER — Other Ambulatory Visit: Payer: Self-pay | Admitting: Cardiology

## 2020-08-16 ENCOUNTER — Ambulatory Visit (INDEPENDENT_AMBULATORY_CARE_PROVIDER_SITE_OTHER): Payer: Medicare HMO

## 2020-08-16 ENCOUNTER — Other Ambulatory Visit: Payer: Self-pay

## 2020-08-16 DIAGNOSIS — I719 Aortic aneurysm of unspecified site, without rupture: Secondary | ICD-10-CM

## 2020-08-16 DIAGNOSIS — I712 Thoracic aortic aneurysm, without rupture: Secondary | ICD-10-CM | POA: Diagnosis not present

## 2020-08-16 LAB — ECHOCARDIOGRAM COMPLETE
Area-P 1/2: 6.02 cm2
Calc EF: 46.1 %
S' Lateral: 2.9 cm
Single Plane A2C EF: 46.4 %
Single Plane A4C EF: 48.2 %

## 2020-08-16 NOTE — Progress Notes (Signed)
Complete echocardiogram performed.  Jimmy Elic Vencill RDCS, RVT  

## 2020-09-10 DIAGNOSIS — Z0100 Encounter for examination of eyes and vision without abnormal findings: Secondary | ICD-10-CM | POA: Diagnosis not present

## 2020-09-25 ENCOUNTER — Other Ambulatory Visit: Payer: Self-pay | Admitting: Cardiology

## 2020-10-12 ENCOUNTER — Other Ambulatory Visit: Payer: Self-pay | Admitting: Cardiology

## 2020-10-15 DIAGNOSIS — D509 Iron deficiency anemia, unspecified: Secondary | ICD-10-CM | POA: Diagnosis not present

## 2020-10-15 DIAGNOSIS — E1142 Type 2 diabetes mellitus with diabetic polyneuropathy: Secondary | ICD-10-CM | POA: Diagnosis not present

## 2020-10-15 DIAGNOSIS — E785 Hyperlipidemia, unspecified: Secondary | ICD-10-CM | POA: Diagnosis not present

## 2020-10-15 DIAGNOSIS — R600 Localized edema: Secondary | ICD-10-CM | POA: Diagnosis not present

## 2020-10-15 DIAGNOSIS — F3341 Major depressive disorder, recurrent, in partial remission: Secondary | ICD-10-CM | POA: Diagnosis not present

## 2020-10-15 DIAGNOSIS — I10 Essential (primary) hypertension: Secondary | ICD-10-CM | POA: Diagnosis not present

## 2020-11-09 DIAGNOSIS — J309 Allergic rhinitis, unspecified: Secondary | ICD-10-CM | POA: Diagnosis not present

## 2020-11-29 DIAGNOSIS — E785 Hyperlipidemia, unspecified: Secondary | ICD-10-CM | POA: Diagnosis not present

## 2020-11-29 DIAGNOSIS — Z0000 Encounter for general adult medical examination without abnormal findings: Secondary | ICD-10-CM | POA: Diagnosis not present

## 2020-11-29 DIAGNOSIS — Z1331 Encounter for screening for depression: Secondary | ICD-10-CM | POA: Diagnosis not present

## 2020-11-29 DIAGNOSIS — Z6838 Body mass index (BMI) 38.0-38.9, adult: Secondary | ICD-10-CM | POA: Diagnosis not present

## 2020-11-29 DIAGNOSIS — Z9181 History of falling: Secondary | ICD-10-CM | POA: Diagnosis not present

## 2020-11-29 DIAGNOSIS — E669 Obesity, unspecified: Secondary | ICD-10-CM | POA: Diagnosis not present

## 2020-12-28 ENCOUNTER — Other Ambulatory Visit: Payer: Self-pay | Admitting: Cardiology

## 2020-12-28 DIAGNOSIS — Z23 Encounter for immunization: Secondary | ICD-10-CM | POA: Diagnosis not present

## 2021-01-14 DIAGNOSIS — E785 Hyperlipidemia, unspecified: Secondary | ICD-10-CM | POA: Diagnosis not present

## 2021-01-14 DIAGNOSIS — F3341 Major depressive disorder, recurrent, in partial remission: Secondary | ICD-10-CM | POA: Diagnosis not present

## 2021-01-14 DIAGNOSIS — E1142 Type 2 diabetes mellitus with diabetic polyneuropathy: Secondary | ICD-10-CM | POA: Diagnosis not present

## 2021-01-14 DIAGNOSIS — D509 Iron deficiency anemia, unspecified: Secondary | ICD-10-CM | POA: Diagnosis not present

## 2021-01-14 DIAGNOSIS — I10 Essential (primary) hypertension: Secondary | ICD-10-CM | POA: Diagnosis not present

## 2021-01-14 DIAGNOSIS — R600 Localized edema: Secondary | ICD-10-CM | POA: Diagnosis not present

## 2021-01-14 DIAGNOSIS — Z139 Encounter for screening, unspecified: Secondary | ICD-10-CM | POA: Diagnosis not present

## 2021-01-17 DIAGNOSIS — L739 Follicular disorder, unspecified: Secondary | ICD-10-CM | POA: Diagnosis not present

## 2021-01-17 DIAGNOSIS — B356 Tinea cruris: Secondary | ICD-10-CM | POA: Diagnosis not present

## 2021-01-23 ENCOUNTER — Other Ambulatory Visit: Payer: Self-pay | Admitting: Cardiology

## 2021-01-30 ENCOUNTER — Ambulatory Visit: Payer: Medicare HMO | Admitting: Cardiology

## 2021-02-08 DIAGNOSIS — E113592 Type 2 diabetes mellitus with proliferative diabetic retinopathy without macular edema, left eye: Secondary | ICD-10-CM | POA: Diagnosis not present

## 2021-02-27 ENCOUNTER — Ambulatory Visit: Payer: Medicare HMO | Admitting: Cardiology

## 2021-03-25 DIAGNOSIS — M545 Low back pain, unspecified: Secondary | ICD-10-CM | POA: Diagnosis not present

## 2021-03-25 DIAGNOSIS — B356 Tinea cruris: Secondary | ICD-10-CM | POA: Diagnosis not present

## 2021-03-25 DIAGNOSIS — M47816 Spondylosis without myelopathy or radiculopathy, lumbar region: Secondary | ICD-10-CM | POA: Diagnosis not present

## 2021-03-25 DIAGNOSIS — G8929 Other chronic pain: Secondary | ICD-10-CM | POA: Diagnosis not present

## 2021-04-19 DIAGNOSIS — R69 Illness, unspecified: Secondary | ICD-10-CM | POA: Diagnosis not present

## 2021-04-19 DIAGNOSIS — D509 Iron deficiency anemia, unspecified: Secondary | ICD-10-CM | POA: Diagnosis not present

## 2021-04-19 DIAGNOSIS — I10 Essential (primary) hypertension: Secondary | ICD-10-CM | POA: Diagnosis not present

## 2021-04-19 DIAGNOSIS — R600 Localized edema: Secondary | ICD-10-CM | POA: Diagnosis not present

## 2021-04-19 DIAGNOSIS — Z125 Encounter for screening for malignant neoplasm of prostate: Secondary | ICD-10-CM | POA: Diagnosis not present

## 2021-04-19 DIAGNOSIS — E1142 Type 2 diabetes mellitus with diabetic polyneuropathy: Secondary | ICD-10-CM | POA: Diagnosis not present

## 2021-04-19 DIAGNOSIS — E785 Hyperlipidemia, unspecified: Secondary | ICD-10-CM | POA: Diagnosis not present

## 2021-05-10 ENCOUNTER — Encounter: Payer: Self-pay | Admitting: Cardiology

## 2021-05-10 ENCOUNTER — Ambulatory Visit (INDEPENDENT_AMBULATORY_CARE_PROVIDER_SITE_OTHER): Payer: Medicare HMO | Admitting: Cardiology

## 2021-05-10 VITALS — BP 132/70 | HR 84 | Ht 71.0 in | Wt 283.0 lb

## 2021-05-10 DIAGNOSIS — I10 Essential (primary) hypertension: Secondary | ICD-10-CM | POA: Diagnosis not present

## 2021-05-10 DIAGNOSIS — D649 Anemia, unspecified: Secondary | ICD-10-CM

## 2021-05-10 DIAGNOSIS — I25118 Atherosclerotic heart disease of native coronary artery with other forms of angina pectoris: Secondary | ICD-10-CM

## 2021-05-10 DIAGNOSIS — E785 Hyperlipidemia, unspecified: Secondary | ICD-10-CM

## 2021-05-10 NOTE — Progress Notes (Signed)
?Cardiology Office Note:   ? ?Date:  05/10/2021  ? ?ID:  [name] Williams, DOB Jun 12, 1950, MRN [id number] ? ?PCP:  Paulina Fusi, MD  ?Cardiologist:  Norman Herrlich, MD   ? ?Referring MD: Paulina Fusi, MD  ? ? ?ASSESSMENT:   ? ?1. Coronary artery disease of native artery of native heart with stable angina pectoris (HCC)   ?2. Essential hypertension   ?3. Dyslipidemia   ?4. Anemia, unspecified type   ? ?PLAN:   ? ?In order of problems listed above: ? ?He has stable CAD has had no recent anginal discomfort he is doing well on medical treatment I would continue aspirin oral nitrate beta-blocker calcium channel blocker and lipid-lowering with his high intensity statin.  At this time I would not revisit revascularization and repeat an ischemia evaluation outside of ACS or an abrupt change in his anginal pattern. ?Stable BP at target continue his current treatment including ARB with diabetes ?Lipids at target continue his high intensity statin ?Continues to be anemic ? poorly controlled diabetes ? ? ?Next appointment: 1 year ? ? ?Medication Adjustments/Labs and Tests Ordered: ?Current medicines are reviewed at length with the patient today.  Concerns regarding medicines are outlined above.  ?Orders Placed This Encounter  ?Procedures  ? EKG 12-Lead  ? ?No orders of the defined types were placed in this encounter. ? ? ?Chief Complaint  ?Patient presents with  ? Follow-up  ? Coronary Artery Disease  ? ? ?History of Present Illness:   ? ?[name] Williams is a 71 y.o. male with a hx of CAD type 2 diabetes hypertensive heart disease with heart failure hyperlipidemia and anemia with a hemoglobin of 6.8 requiring outpatient transfusion when last seen 07/26/2020.  Subsequent evaluation showed the presence of moderate to severe two-vessel CAD with clinical recommendation from interventional cardiology of medical therapy. ? ?Compliance with diet, lifestyle and medications: Yes ? ?You do not have a good understanding of the results  of his heart catheterization. ?I reviewed the findings with him. ?We discussed that he did not have any indication for bypass surgery and had a relative contraindication to elective PCI with his history of ulcer disease bleeding and anemia.  In this situation outside of ACS would be a tool to reduce her burden of ischemic symptoms and he is not having frequent angina.  He tells me he has had recurrent bleeding both on toilet paper he sees it in the toilet at times bright red and his recent hemoglobin was diminished at 10.2.  He is not having exertional angina edema shortness of breath palpitation or syncope. ? ?Recent labs 04/13/2021 cholesterol 152 LDL 69 A1c poorly controlled at 10.0 anemic hemoglobin 10.2 creatinine 1.05 ?Past Medical History:  ?Diagnosis Date  ? Anemia 05/18/2019  ? Anxiety disorder   ? Blood transfusion without reported diagnosis   ? BPH (benign prostatic hyperplasia)   ? Cataract   ? Colon polyps   ? DDD (degenerative disc disease), cervical   ? Depression   ? Dyslipidemia 05/29/2017  ? Essential hypertension 05/29/2017  ? Generalized osteoarthritis of multiple sites   ? Gout   ? Hyperlipidemia   ? Hypertension, benign   ? Laryngopharyngeal reflux   ? Morbid obesity (HCC) 11/26/2018  ? Shortness of breath 07/17/2017  ? Sinus arrhythmia 11/26/2018  ? Swelling 05/29/2017  ? Tobacco abuse   ? Type 2 diabetes mellitus without complication, without long-term current use of insulin (HCC) 05/29/2017  ? Uncontrolled type 2 diabetes mellitus  with peripheral neuropathy   ? ? ?Past Surgical History:  ?Procedure Laterality Date  ? HERNIA REPAIR  1998  ? Abdominal  ? LEFT HEART CATH AND CORONARY ANGIOGRAPHY N/A 10/13/2019  ? Procedure: LEFT HEART CATH AND CORONARY ANGIOGRAPHY;  Surgeon: Yvonne Kendall, MD;  Location: [facility];  Service: Cardiovascular;  Laterality: N/A;  ? MINOR HEMORRHOIDECTOMY  1998  ? NASAL SINUS SURGERY  1978  ? SHOULDER SURGERY Left   ? X2  ? TONSILLECTOMY  1967  ? ? ?Current  Medications: ?Current Meds  ?Medication Sig  ? allopurinol (ZYLOPRIM) 300 MG tablet Take 300 mg by mouth daily.  ? amLODipine (NORVASC) 5 MG tablet Take 5 mg by mouth daily.   ? aspirin EC 81 MG tablet Take 81 mg by mouth daily. Swallow whole.  ? atorvastatin (LIPITOR) 80 MG tablet Take 80 mg by mouth at bedtime.   ? cetirizine (ZYRTEC) 10 MG tablet Take 10 mg by mouth daily.   ? citalopram (CELEXA) 20 MG tablet Take 20 mg by mouth daily.  ? docusate sodium (COLACE) 100 MG capsule Take 100 mg by mouth daily.  ? doxycycline (VIBRA-TABS) 100 MG tablet Take 100 mg by mouth 2 (two) times daily.  ? FEROSUL 325 (65 Fe) MG tablet TAKE 1 TABLET EVERY OTHER DAY  ? furosemide (LASIX) 40 MG tablet Take 1 tablet (40 mg total) by mouth 2 (two) times daily.  ? gabapentin (NEURONTIN) 300 MG capsule Take 300 mg by mouth 3 (three) times daily.  ? gemfibrozil (LOPID) 600 MG tablet Take 600 mg by mouth 2 (two) times daily before a meal.  ? glimepiride (AMARYL) 4 MG tablet Take 4 mg by mouth 2 (two) times daily.  ? isosorbide mononitrate (IMDUR) 30 MG 24 hr tablet TAKE 1 TABLET EVERY DAY  ? losartan (COZAAR) 100 MG tablet Take 100 mg by mouth at bedtime.   ? magnesium oxide (MAG-OX) 400 (241.3 Mg) MG tablet TAKE 1 TABLET IN THE MORNING AND AT BEDTIME  ? meloxicam (MOBIC) 7.5 MG tablet Take 7.5 mg by mouth 2 (two) times daily.  ? metFORMIN (GLUCOPHAGE) 1000 MG tablet Take 1 tablet (1,000 mg total) by mouth 2 (two) times daily with a meal.  ? metoprolol succinate (TOPROL-XL) 25 MG 24 hr tablet TAKE 1/2 TABLET EVERY DAY  ? montelukast (SINGULAIR) 10 MG tablet Take 10 mg by mouth daily.   ? naphazoline-pheniramine (NAPHCON-A) 0.025-0.3 % ophthalmic solution Place 1 drop into both eyes 3 (three) times daily as needed for eye irritation (dry/irritated/allergy eyes.).   ? nitroGLYCERIN (NITROSTAT) 0.4 MG SL tablet Place 1 tablet (0.4 mg total) under the tongue every 5 (five) minutes as needed for chest pain. (Patient taking differently: Place  0.4 mg under the tongue every 5 (five) minutes x 3 doses as needed for chest pain.)  ? NOVOLOG FLEXPEN 100 UNIT/ML FlexPen Inject 20 Units into the skin in the morning, at noon, and at bedtime.   ? Omega-3 Fatty Acids (OMEGA 3 PO) Take 2 capsules by mouth daily. Omega XL  ? omeprazole (PRILOSEC) 40 MG capsule Take 40 mg by mouth at bedtime.  ? pantoprazole (PROTONIX) 40 MG tablet TAKE 1 TABLET EVERY DAY  ? potassium chloride SA (KLOR-CON M) 20 MEQ tablet TAKE 1 TABLET EVERY DAY  ? tamsulosin (FLOMAX) 0.4 MG CAPS capsule Take 0.4 mg by mouth daily.  ? traMADol-acetaminophen (ULTRACET) 37.5-325 MG tablet   ? TRESIBA FLEXTOUCH 100 UNIT/ML FlexTouch Pen Inject 60 Units into the skin  daily.   ?  ? ?Allergies:   Pseudoephedrine, Penicillins, and Tetracyclines & related  ? ?Social History  ? ?Socioeconomic History  ? Marital status: Married  ?  Spouse name: Not on file  ? Number of children: Not on file  ? Years of education: Not on file  ? Highest education level: Not on file  ?Occupational History  ? Not on file  ?Tobacco Use  ? Smoking status: Former  ? Smokeless tobacco: Former  ?  Types: Chew  ?Substance and Sexual Activity  ? Alcohol use: Not Currently  ? Drug use: Never  ? Sexual activity: Not on file  ?Other Topics Concern  ? Not on file  ?Social History Narrative  ? Not on file  ? ?Social Determinants of Health  ? ?Financial Resource Strain: Not on file  ?Food Insecurity: Not on file  ?Transportation Needs: Not on file  ?Physical Activity: Not on file  ?Stress: Not on file  ?Social Connections: Not on file  ?  ? ?Family History: ?The patient's family history includes Alcohol abuse in his father; Diabetes in his brother; Lung cancer in his father; Stomach cancer in his maternal grandmother and paternal grandmother; Stroke in his father. There is no history of Colon cancer, Esophageal cancer, or Rectal cancer. ?ROS:   ?Please see the history of present illness.    ?All other systems reviewed and are  negative. ? ?EKGs/Labs/Other Studies Reviewed:   ? ?The following studies were reviewed today: ? ?Echocardiogram 08/16/2020 shows moderate LVH low normal ejection fraction 50 to 55% grade 1 diastolic dysfunction right v

## 2021-05-10 NOTE — Patient Instructions (Signed)
Medication Instructions:  ?Your physician recommends that you continue on your current medications as directed. Please refer to the Current Medication list given to you today.  ?*If you need a refill on your cardiac medications before your next appointment, please call your pharmacy* ? ? ?Lab Work: ?None ordered ?If you have labs (blood work) drawn today and your tests are completely normal, you will receive your results only by: ?MyChart Message (if you have MyChart) OR ?A paper copy in the mail ?If you have any lab test that is abnormal or we need to change your treatment, we will call you to review the results. ? ? ?Testing/Procedures: ?None Ordered ? ? ?Follow-Up: ?At [facility], you and your health needs are our priority.  As part of our continuing mission to provide you with exceptional heart care, we have created designated Provider Care Teams.  These Care Teams include your primary Cardiologist (physician) and Advanced Practice Providers (APPs -  Physician Assistants and Nurse Practitioners) who all work together to provide you with the care you need, when you need it. ? ?We recommend signing up for the patient portal called "MyChart".  Sign up information is provided on this After Visit Summary.  MyChart is used to connect with patients for Virtual Visits (Telemedicine).  Patients are able to view lab/test results, encounter notes, upcoming appointments, etc.  Non-urgent messages can be sent to your provider as well.   ?To learn more about what you can do with MyChart, go to [email or web address].   ? ?Your next appointment:   ?12 month(s) ? ?The format for your next appointment:   ?In Person ? ?Provider:   ?Shirlee More, MD  ? ? ?Other Instructions ?None ? ? ? ? ?

## 2021-05-17 DIAGNOSIS — J019 Acute sinusitis, unspecified: Secondary | ICD-10-CM | POA: Diagnosis not present

## 2021-05-20 DIAGNOSIS — K219 Gastro-esophageal reflux disease without esophagitis: Secondary | ICD-10-CM | POA: Diagnosis not present

## 2021-05-20 DIAGNOSIS — E113599 Type 2 diabetes mellitus with proliferative diabetic retinopathy without macular edema, unspecified eye: Secondary | ICD-10-CM | POA: Diagnosis not present

## 2021-05-20 DIAGNOSIS — R69 Illness, unspecified: Secondary | ICD-10-CM | POA: Diagnosis not present

## 2021-05-20 DIAGNOSIS — E669 Obesity, unspecified: Secondary | ICD-10-CM | POA: Diagnosis not present

## 2021-05-20 DIAGNOSIS — E785 Hyperlipidemia, unspecified: Secondary | ICD-10-CM | POA: Diagnosis not present

## 2021-05-20 DIAGNOSIS — M109 Gout, unspecified: Secondary | ICD-10-CM | POA: Diagnosis not present

## 2021-05-20 DIAGNOSIS — E1151 Type 2 diabetes mellitus with diabetic peripheral angiopathy without gangrene: Secondary | ICD-10-CM | POA: Diagnosis not present

## 2021-05-20 DIAGNOSIS — I25119 Atherosclerotic heart disease of native coronary artery with unspecified angina pectoris: Secondary | ICD-10-CM | POA: Diagnosis not present

## 2021-05-20 DIAGNOSIS — E1142 Type 2 diabetes mellitus with diabetic polyneuropathy: Secondary | ICD-10-CM | POA: Diagnosis not present

## 2021-05-20 DIAGNOSIS — M1990 Unspecified osteoarthritis, unspecified site: Secondary | ICD-10-CM | POA: Diagnosis not present

## 2021-05-20 DIAGNOSIS — I10 Essential (primary) hypertension: Secondary | ICD-10-CM | POA: Diagnosis not present

## 2021-05-20 DIAGNOSIS — Z008 Encounter for other general examination: Secondary | ICD-10-CM | POA: Diagnosis not present

## 2021-06-06 ENCOUNTER — Telehealth: Payer: Self-pay | Admitting: Cardiology

## 2021-06-06 NOTE — Telephone Encounter (Signed)
Called pt per message. Voice mailbox full, unable to leave message ?

## 2021-06-06 NOTE — Telephone Encounter (Signed)
Attempted to call patient. Patient did not answer the phone and the mail box is full and unable to leave message. ?

## 2021-06-06 NOTE — Telephone Encounter (Signed)
?  Pt c/o medication issue: ? ?1. Name of Medication: isosorbide mononitrate (IMDUR) 30 MG 24 hr tablet ?furosemide (LASIX) 40 MG tablet ? ? ?2. How are you currently taking this medication (dosage and times per day)?  ? ?3. Are you having a reaction (difficulty breathing--STAT)?  ? ?4. What is your medication issue? Pt would like to ask Dr. Bettina Gavia if he needs to continue taking these meds ? ?

## 2021-06-07 NOTE — Telephone Encounter (Signed)
Patient informed of Dr. Joya Gaskins recommendation that he continue taking his current medications at this time. Patient had no further questions at this time. ?

## 2021-06-13 DIAGNOSIS — I25118 Atherosclerotic heart disease of native coronary artery with other forms of angina pectoris: Secondary | ICD-10-CM | POA: Diagnosis not present

## 2021-07-22 DIAGNOSIS — E785 Hyperlipidemia, unspecified: Secondary | ICD-10-CM | POA: Diagnosis not present

## 2021-07-22 DIAGNOSIS — E669 Obesity, unspecified: Secondary | ICD-10-CM | POA: Diagnosis not present

## 2021-07-22 DIAGNOSIS — I10 Essential (primary) hypertension: Secondary | ICD-10-CM | POA: Diagnosis not present

## 2021-07-22 DIAGNOSIS — E1142 Type 2 diabetes mellitus with diabetic polyneuropathy: Secondary | ICD-10-CM | POA: Diagnosis not present

## 2021-07-22 DIAGNOSIS — I25118 Atherosclerotic heart disease of native coronary artery with other forms of angina pectoris: Secondary | ICD-10-CM | POA: Diagnosis not present

## 2021-07-22 DIAGNOSIS — R600 Localized edema: Secondary | ICD-10-CM | POA: Diagnosis not present

## 2021-07-22 DIAGNOSIS — D509 Iron deficiency anemia, unspecified: Secondary | ICD-10-CM | POA: Diagnosis not present

## 2021-07-22 DIAGNOSIS — R69 Illness, unspecified: Secondary | ICD-10-CM | POA: Diagnosis not present

## 2021-08-15 IMAGING — CT CT HEART MORP W/ CTA COR W/ SCORE W/ CA W/CM &/OR W/O CM
1 of 8 series · 9 of 20 positions shown, 12 images · IV contrast (APPLIED)
Comparison: None.
COMPARISON: None.

Addendum:
EXAM:
OVER-READ INTERPRETATION  CT CHEST

The following report is an over-read performed by radiologist Dr.
Lazie Love Sydney [REDACTED] on 05/10/2019. This over-read
does not include interpretation of cardiac or coronary anatomy or
pathology. The coronary CTA interpretation by the cardiologist is
attached.
CLINICAL DATA: 69 year old male with atypical angina. Medical
history includes hypertension, hyperlipidemia, Diabetes Mellitus and
obesity.
Cardiac/Coronary  CT
TECHNIQUE: The patient was scanned on a Phillips Force scanner.

[Series 11: multiphase · axial · 0.39mm/px · z∈[+1092,+1193]mm · 9 of 1908 slices shown, 12 images]
[im 191/1908  vessel]
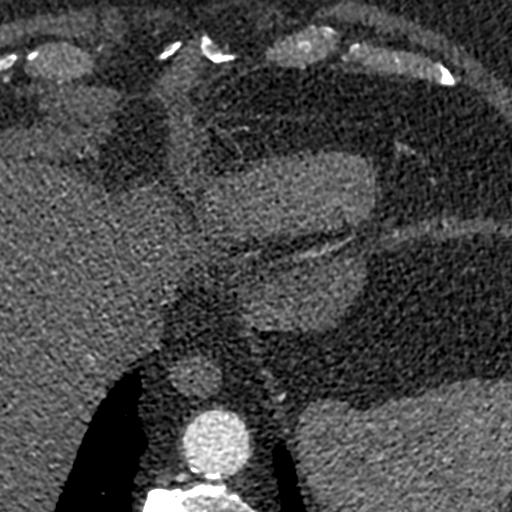
[im 191/1908  lung]
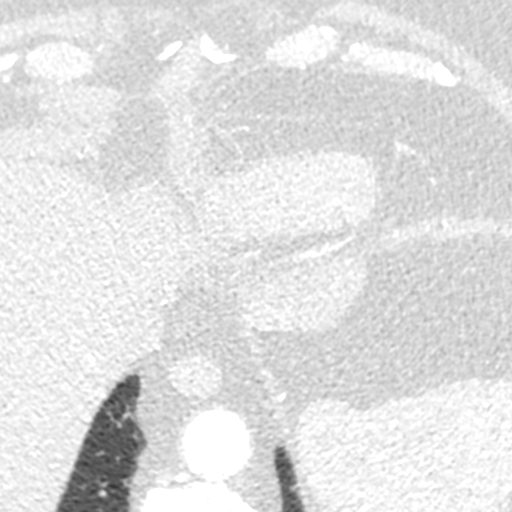
[im 382/1908  vessel]
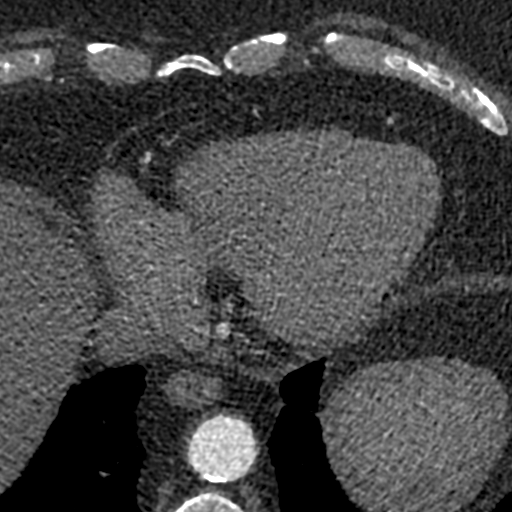
[im 573/1908  vessel]
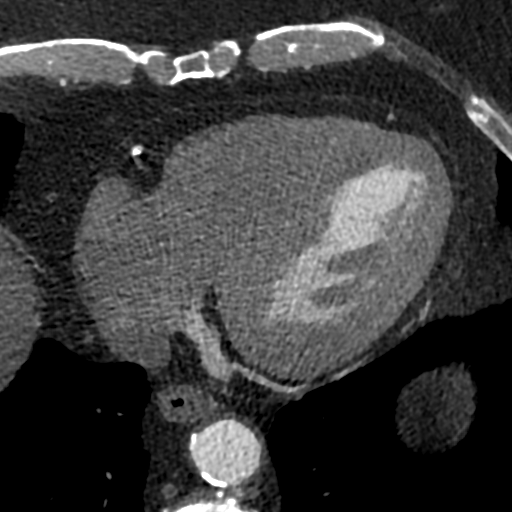
[im 763/1908  vessel]
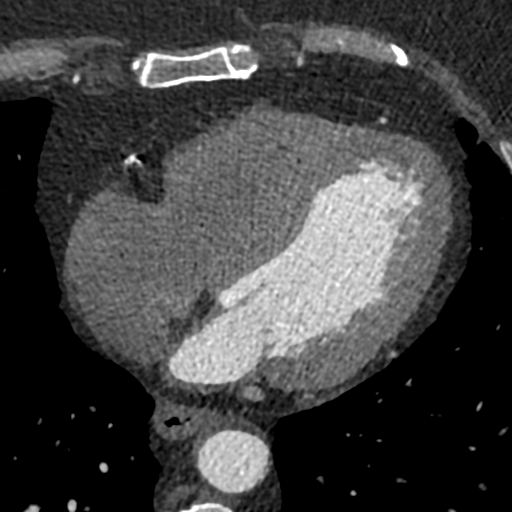
[im 954/1908  vessel]
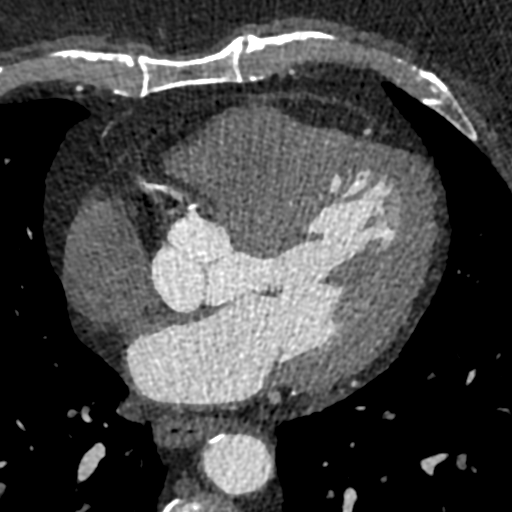
[im 954/1908  lung]
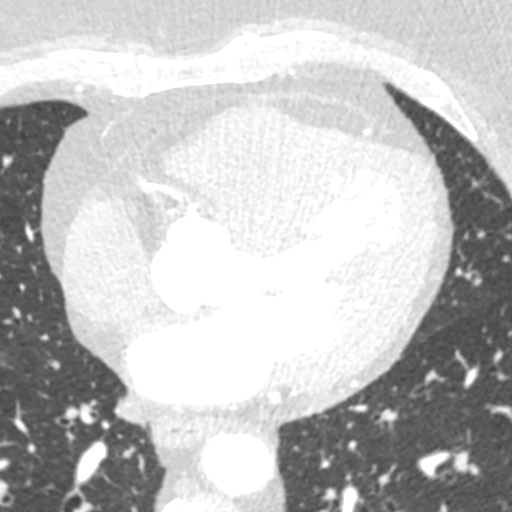
[im 1145/1908  vessel]
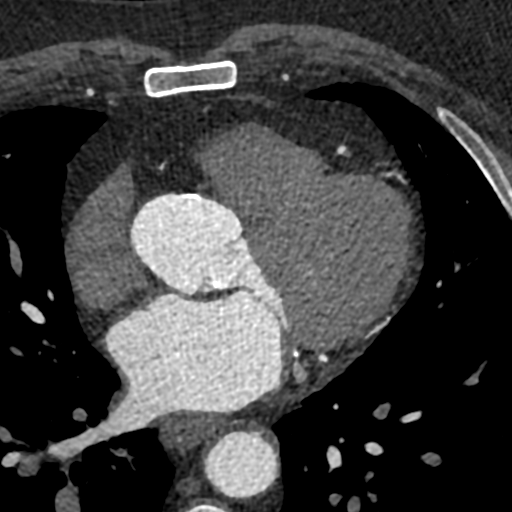
[im 1335/1908  vessel]
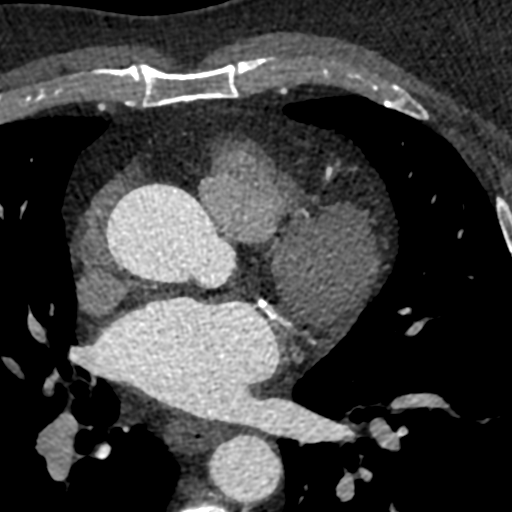
[im 1526/1908  vessel]
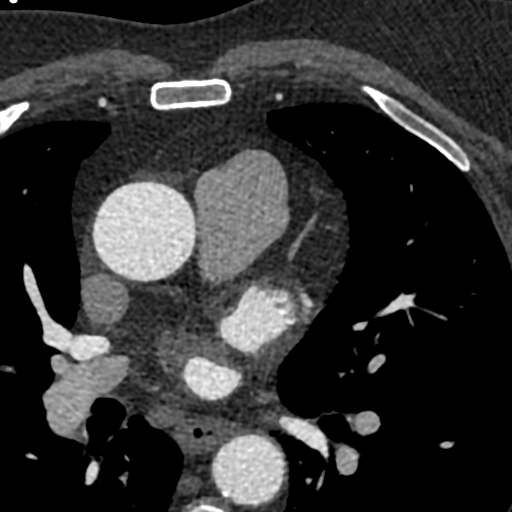
[im 1717/1908  vessel]
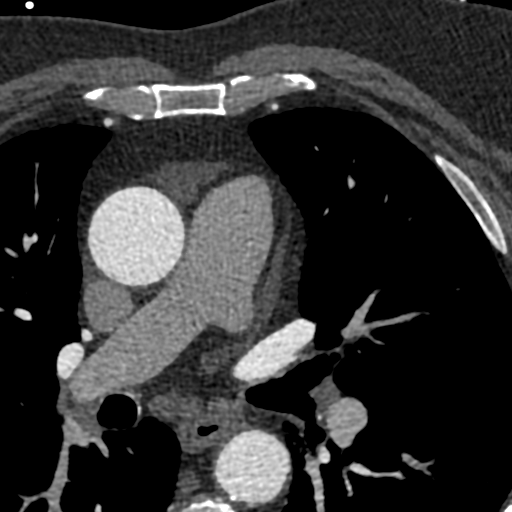
[im 1717/1908  lung]
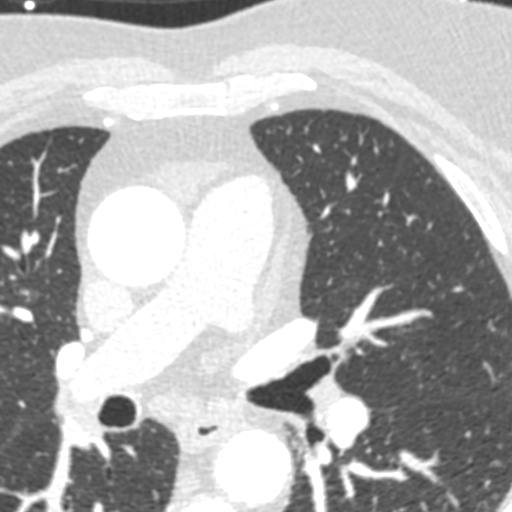

[9 of 20 positions shown; findings below may reference images not displayed]

FINDINGS: Vascular: No significant vascular findings. Normal heart size. No
pericardial effusion.

Mediastinum/Nodes: Upper normal lymph nodes are seen in the
subcarinal station and right hilum.

Lungs/Pleura: No suspicious nodule or mass.  No consolidation.

Upper Abdomen: Visualized portions of the upper liver and spleen are
unremarkable.

Musculoskeletal: No worrisome lytic or sclerotic osseous
abnormality.
IMPRESSION: No acute or clinically significant finding in the visualized
extracardiac anatomy of the chest.
FINDINGS: A 120 kV prospective scan was triggered in the descending thoracic
aorta at 111 HU's. Axial non-contrast 3 mm slices were carried out
through the heart. The data set was analyzed on a dedicated work
station and scored using the Agatson method. Gantry rotation speed
was 250 msecs and collimation was .6 mm. No beta blockade and 0.8 mg
of sl NTG was given. The 3D data set was reconstructed in 5%
intervals of the 67-82 % of the R-R cycle. Diastolic phases were
analyzed on a dedicated work station using MPR, MIP and VRT modes.
The patient received 80 cc of contrast.

Aorta: Normal size. Calcification of the aortic root and descending
aorta. No dissection.

Aortic Valve:  Trileaflet.  No calcifications.

Coronary Arteries:  Normal coronary origin.  CO-dominance.

RCA is a large dominant artery that gives rise to PDA and PLVB.
There is a mild (25-49%) mixed plaque in the proximal RCA. The mid
and distal portion is not well visualized due to blooming artifact.

Left main is a large artery that gives rise to LAD and LCX arteries.
The left main with a mild (25-49%) mixed plaque.

LAD is a large vessel. There is a mild (25-49%) calcified plaque in
the proximal LAD. The mid LAD with moderate (50-69%) calcified
plaque. The mid-distal portion of the vessel with mild (25-49%). D1
is a small vessel with no plaque.

LCX is a co-dominant artery that gives rise to one large OM1 branch.
The proximal LCX with lumpy-bumpy diffuse mild (25-49%) lesions. The
mid LCX with minimal (<24%) soft plaque. OM1 with mild (25-49%) soft
plaque.

Other findings:

Normal pulmonary vein drainage into the left atrium.

Normal left atrial appendage without a thrombus.

Normal size of the pulmonary artery.
IMPRESSION: 1. Coronary calcium score of 7914. This was 86 percentile for age
and sex matched control.

2. Normal coronary origin with Co-Dominant system.

3.  Moderate CAD.  CADRADS 3.  This study will be sent for FFRct.

4.  Aorta root and descending aorta atherosclerosis.

Meylin Hickerson, DO

*** End of Addendum ***
EXAM:
OVER-READ INTERPRETATION  CT CHEST

The following report is an over-read performed by radiologist Dr.
Lazie Love Sydney [REDACTED] on 05/10/2019. This over-read
does not include interpretation of cardiac or coronary anatomy or
pathology. The coronary CTA interpretation by the cardiologist is
attached.
FINDINGS: Vascular: No significant vascular findings. Normal heart size. No
pericardial effusion.

Mediastinum/Nodes: Upper normal lymph nodes are seen in the
subcarinal station and right hilum.

Lungs/Pleura: No suspicious nodule or mass.  No consolidation.

Upper Abdomen: Visualized portions of the upper liver and spleen are
unremarkable.

Musculoskeletal: No worrisome lytic or sclerotic osseous
abnormality.
IMPRESSION: No acute or clinically significant finding in the visualized
extracardiac anatomy of the chest.

## 2021-10-02 DIAGNOSIS — K2100 Gastro-esophageal reflux disease with esophagitis, without bleeding: Secondary | ICD-10-CM | POA: Diagnosis not present

## 2021-10-02 DIAGNOSIS — R21 Rash and other nonspecific skin eruption: Secondary | ICD-10-CM | POA: Diagnosis not present

## 2021-10-28 DIAGNOSIS — R69 Illness, unspecified: Secondary | ICD-10-CM | POA: Diagnosis not present

## 2021-10-28 DIAGNOSIS — I10 Essential (primary) hypertension: Secondary | ICD-10-CM | POA: Diagnosis not present

## 2021-10-28 DIAGNOSIS — Z23 Encounter for immunization: Secondary | ICD-10-CM | POA: Diagnosis not present

## 2021-10-28 DIAGNOSIS — E785 Hyperlipidemia, unspecified: Secondary | ICD-10-CM | POA: Diagnosis not present

## 2021-10-28 DIAGNOSIS — I25118 Atherosclerotic heart disease of native coronary artery with other forms of angina pectoris: Secondary | ICD-10-CM | POA: Diagnosis not present

## 2021-10-28 DIAGNOSIS — E1142 Type 2 diabetes mellitus with diabetic polyneuropathy: Secondary | ICD-10-CM | POA: Diagnosis not present

## 2021-10-28 DIAGNOSIS — R600 Localized edema: Secondary | ICD-10-CM | POA: Diagnosis not present

## 2021-10-28 DIAGNOSIS — D509 Iron deficiency anemia, unspecified: Secondary | ICD-10-CM | POA: Diagnosis not present

## 2022-01-28 DIAGNOSIS — R69 Illness, unspecified: Secondary | ICD-10-CM | POA: Diagnosis not present

## 2022-01-28 DIAGNOSIS — D509 Iron deficiency anemia, unspecified: Secondary | ICD-10-CM | POA: Diagnosis not present

## 2022-01-28 DIAGNOSIS — I10 Essential (primary) hypertension: Secondary | ICD-10-CM | POA: Diagnosis not present

## 2022-01-28 DIAGNOSIS — E785 Hyperlipidemia, unspecified: Secondary | ICD-10-CM | POA: Diagnosis not present

## 2022-01-28 DIAGNOSIS — I25118 Atherosclerotic heart disease of native coronary artery with other forms of angina pectoris: Secondary | ICD-10-CM | POA: Diagnosis not present

## 2022-01-28 DIAGNOSIS — E1142 Type 2 diabetes mellitus with diabetic polyneuropathy: Secondary | ICD-10-CM | POA: Diagnosis not present

## 2022-01-28 DIAGNOSIS — E669 Obesity, unspecified: Secondary | ICD-10-CM | POA: Diagnosis not present

## 2022-01-28 DIAGNOSIS — R600 Localized edema: Secondary | ICD-10-CM | POA: Diagnosis not present

## 2022-02-20 DIAGNOSIS — E1142 Type 2 diabetes mellitus with diabetic polyneuropathy: Secondary | ICD-10-CM | POA: Diagnosis not present

## 2022-03-24 DIAGNOSIS — N401 Enlarged prostate with lower urinary tract symptoms: Secondary | ICD-10-CM | POA: Diagnosis not present

## 2022-03-24 DIAGNOSIS — J019 Acute sinusitis, unspecified: Secondary | ICD-10-CM | POA: Diagnosis not present

## 2022-03-24 DIAGNOSIS — B9689 Other specified bacterial agents as the cause of diseases classified elsewhere: Secondary | ICD-10-CM | POA: Diagnosis not present

## 2022-04-16 DIAGNOSIS — R0982 Postnasal drip: Secondary | ICD-10-CM | POA: Diagnosis not present

## 2022-04-22 DIAGNOSIS — R0981 Nasal congestion: Secondary | ICD-10-CM | POA: Diagnosis not present

## 2022-04-22 DIAGNOSIS — R053 Chronic cough: Secondary | ICD-10-CM | POA: Diagnosis not present

## 2022-04-30 DIAGNOSIS — E1142 Type 2 diabetes mellitus with diabetic polyneuropathy: Secondary | ICD-10-CM | POA: Diagnosis not present

## 2022-04-30 DIAGNOSIS — E785 Hyperlipidemia, unspecified: Secondary | ICD-10-CM | POA: Diagnosis not present

## 2022-04-30 DIAGNOSIS — R600 Localized edema: Secondary | ICD-10-CM | POA: Diagnosis not present

## 2022-04-30 DIAGNOSIS — Z125 Encounter for screening for malignant neoplasm of prostate: Secondary | ICD-10-CM | POA: Diagnosis not present

## 2022-04-30 DIAGNOSIS — D509 Iron deficiency anemia, unspecified: Secondary | ICD-10-CM | POA: Diagnosis not present

## 2022-04-30 DIAGNOSIS — I10 Essential (primary) hypertension: Secondary | ICD-10-CM | POA: Diagnosis not present

## 2022-05-15 DIAGNOSIS — K120 Recurrent oral aphthae: Secondary | ICD-10-CM | POA: Diagnosis not present

## 2022-06-16 DIAGNOSIS — E1142 Type 2 diabetes mellitus with diabetic polyneuropathy: Secondary | ICD-10-CM | POA: Diagnosis not present

## 2022-06-23 DIAGNOSIS — K148 Other diseases of tongue: Secondary | ICD-10-CM | POA: Diagnosis not present

## 2022-06-26 DIAGNOSIS — E669 Obesity, unspecified: Secondary | ICD-10-CM | POA: Diagnosis not present

## 2022-06-26 DIAGNOSIS — K1321 Leukoplakia of oral mucosa, including tongue: Secondary | ICD-10-CM | POA: Diagnosis not present

## 2022-06-26 DIAGNOSIS — R0981 Nasal congestion: Secondary | ICD-10-CM | POA: Diagnosis not present

## 2022-06-26 DIAGNOSIS — Z87891 Personal history of nicotine dependence: Secondary | ICD-10-CM | POA: Diagnosis not present

## 2022-07-02 ENCOUNTER — Other Ambulatory Visit: Payer: Self-pay

## 2022-07-03 NOTE — Progress Notes (Deleted)
Cardiology Office Note:    Date:  07/03/2022   ID:  [name] Williams, DOB 07/21/1950, MRN [id number]  PCP:  Paulina Fusi, MD  Cardiologist:  Norman Herrlich, MD    Referring MD: Paulina Fusi, MD    ASSESSMENT:    No diagnosis found. PLAN:    In order of problems listed above:  ***   Next appointment: ***   Medication Adjustments/Labs and Tests Ordered: Current medicines are reviewed at length with the patient today.  Concerns regarding medicines are outlined above.  No orders of the defined types were placed in this encounter.  No orders of the defined types were placed in this encounter.   No chief complaint on file.   History of Present Illness:    [name] Williams is a 72 y.o. 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) 10/13/2019   DDD (degenerative disc disease), cervical    Depression    Dyslipidemia 05/29/2017   Essential hypertension 05/29/2017   Generalized osteoarthritis of multiple sites    Gout    Hyperlipidemia    Hypertension, benign    Laryngopharyngeal reflux    Morbid obesity (HCC) 11/26/2018   Shortness of breath 07/17/2017   Sinus arrhythmia 11/26/2018   Swelling 05/29/2017   Tobacco abuse    Type 2 diabetes mellitus without complication, without long-term current use of insulin (HCC) 05/29/2017   Uncontrolled type 2 diabetes mellitus with peripheral neuropathy     Past Surgical History:  Procedure Laterality Date    HERNIA REPAIR  1998   Abdominal   LEFT HEART CATH AND CORONARY ANGIOGRAPHY N/A 10/13/2019   Procedure: LEFT HEART CATH AND CORONARY ANGIOGRAPHY;  Surgeon: Yvonne Kendall, MD;  Location: [facility];  Service: Cardiovascular;  Laterality: N/A;   MINOR HEMORRHOIDECTOMY  1998   NASAL SINUS SURGERY  1978   SHOULDER SURGERY Left    X2   TONSILLECTOMY  1967    Current Medications: No outpatient medications have been marked as taking for the 07/04/22 encounter (Appointment) with Baldo Daub, MD.     Allergies:   Pseudoephedrine, Penicillins, and Tetracyclines & related   Social History   Socioeconomic History   Marital status: Married    Spouse name: Not on file   Number of children: Not on file   Years of education: Not on file   Highest education level: Not on file  Occupational History   Not on file  Tobacco Use   Smoking status: Former   Smokeless tobacco: Former    Types: Chew  Substance and Sexual Activity   Alcohol use: Not Currently   Drug use: Never   Sexual activity: Not on file  Other Topics Concern   Not on file  Social History Narrative   Not on file   Social Determinants of Health   Financial Resource Strain: Not on file  Food Insecurity: Not on file  Transportation Needs: Not on file  Physical Activity: Not on file  Cardiology Office Note:    Date:  07/03/2022   ID:  [name] Williams, DOB 07/21/1950, MRN [id number]  PCP:  Paulina Fusi, MD  Cardiologist:  Norman Herrlich, MD    Referring MD: Paulina Fusi, MD    ASSESSMENT:    No diagnosis found. PLAN:    In order of problems listed above:  ***   Next appointment: ***   Medication Adjustments/Labs and Tests Ordered: Current medicines are reviewed at length with the patient today.  Concerns regarding medicines are outlined above.  No orders of the defined types were placed in this encounter.  No orders of the defined types were placed in this encounter.   No chief complaint on file.   History of Present Illness:    [name] Williams is a 72 y.o. 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) 10/13/2019   DDD (degenerative disc disease), cervical    Depression    Dyslipidemia 05/29/2017   Essential hypertension 05/29/2017   Generalized osteoarthritis of multiple sites    Gout    Hyperlipidemia    Hypertension, benign    Laryngopharyngeal reflux    Morbid obesity (HCC) 11/26/2018   Shortness of breath 07/17/2017   Sinus arrhythmia 11/26/2018   Swelling 05/29/2017   Tobacco abuse    Type 2 diabetes mellitus without complication, without long-term current use of insulin (HCC) 05/29/2017   Uncontrolled type 2 diabetes mellitus with peripheral neuropathy     Past Surgical History:  Procedure Laterality Date    HERNIA REPAIR  1998   Abdominal   LEFT HEART CATH AND CORONARY ANGIOGRAPHY N/A 10/13/2019   Procedure: LEFT HEART CATH AND CORONARY ANGIOGRAPHY;  Surgeon: Yvonne Kendall, MD;  Location: [facility];  Service: Cardiovascular;  Laterality: N/A;   MINOR HEMORRHOIDECTOMY  1998   NASAL SINUS SURGERY  1978   SHOULDER SURGERY Left    X2   TONSILLECTOMY  1967    Current Medications: No outpatient medications have been marked as taking for the 07/04/22 encounter (Appointment) with Baldo Daub, MD.     Allergies:   Pseudoephedrine, Penicillins, and Tetracyclines & related   Social History   Socioeconomic History   Marital status: Married    Spouse name: Not on file   Number of children: Not on file   Years of education: Not on file   Highest education level: Not on file  Occupational History   Not on file  Tobacco Use   Smoking status: Former   Smokeless tobacco: Former    Types: Chew  Substance and Sexual Activity   Alcohol use: Not Currently   Drug use: Never   Sexual activity: Not on file  Other Topics Concern   Not on file  Social History Narrative   Not on file   Social Determinants of Health   Financial Resource Strain: Not on file  Food Insecurity: Not on file  Transportation Needs: Not on file  Physical Activity: Not on file  Cardiology Office Note:    Date:  07/03/2022   ID:  [name] Williams, DOB 07/21/1950, MRN [id number]  PCP:  Paulina Fusi, MD  Cardiologist:  Norman Herrlich, MD    Referring MD: Paulina Fusi, MD    ASSESSMENT:    No diagnosis found. PLAN:    In order of problems listed above:  ***   Next appointment: ***   Medication Adjustments/Labs and Tests Ordered: Current medicines are reviewed at length with the patient today.  Concerns regarding medicines are outlined above.  No orders of the defined types were placed in this encounter.  No orders of the defined types were placed in this encounter.   No chief complaint on file.   History of Present Illness:    [name] Williams is a 72 y.o. 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) 10/13/2019   DDD (degenerative disc disease), cervical    Depression    Dyslipidemia 05/29/2017   Essential hypertension 05/29/2017   Generalized osteoarthritis of multiple sites    Gout    Hyperlipidemia    Hypertension, benign    Laryngopharyngeal reflux    Morbid obesity (HCC) 11/26/2018   Shortness of breath 07/17/2017   Sinus arrhythmia 11/26/2018   Swelling 05/29/2017   Tobacco abuse    Type 2 diabetes mellitus without complication, without long-term current use of insulin (HCC) 05/29/2017   Uncontrolled type 2 diabetes mellitus with peripheral neuropathy     Past Surgical History:  Procedure Laterality Date    HERNIA REPAIR  1998   Abdominal   LEFT HEART CATH AND CORONARY ANGIOGRAPHY N/A 10/13/2019   Procedure: LEFT HEART CATH AND CORONARY ANGIOGRAPHY;  Surgeon: Yvonne Kendall, MD;  Location: [facility];  Service: Cardiovascular;  Laterality: N/A;   MINOR HEMORRHOIDECTOMY  1998   NASAL SINUS SURGERY  1978   SHOULDER SURGERY Left    X2   TONSILLECTOMY  1967    Current Medications: No outpatient medications have been marked as taking for the 07/04/22 encounter (Appointment) with Baldo Daub, MD.     Allergies:   Pseudoephedrine, Penicillins, and Tetracyclines & related   Social History   Socioeconomic History   Marital status: Married    Spouse name: Not on file   Number of children: Not on file   Years of education: Not on file   Highest education level: Not on file  Occupational History   Not on file  Tobacco Use   Smoking status: Former   Smokeless tobacco: Former    Types: Chew  Substance and Sexual Activity   Alcohol use: Not Currently   Drug use: Never   Sexual activity: Not on file  Other Topics Concern   Not on file  Social History Narrative   Not on file   Social Determinants of Health   Financial Resource Strain: Not on file  Food Insecurity: Not on file  Transportation Needs: Not on file  Physical Activity: Not on file  Cardiology Office Note:    Date:  07/03/2022   ID:  [name] Williams, DOB 07/21/1950, MRN [id number]  PCP:  Paulina Fusi, MD  Cardiologist:  Norman Herrlich, MD    Referring MD: Paulina Fusi, MD    ASSESSMENT:    No diagnosis found. PLAN:    In order of problems listed above:  ***   Next appointment: ***   Medication Adjustments/Labs and Tests Ordered: Current medicines are reviewed at length with the patient today.  Concerns regarding medicines are outlined above.  No orders of the defined types were placed in this encounter.  No orders of the defined types were placed in this encounter.   No chief complaint on file.   History of Present Illness:    [name] Williams is a 72 y.o. 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) 10/13/2019   DDD (degenerative disc disease), cervical    Depression    Dyslipidemia 05/29/2017   Essential hypertension 05/29/2017   Generalized osteoarthritis of multiple sites    Gout    Hyperlipidemia    Hypertension, benign    Laryngopharyngeal reflux    Morbid obesity (HCC) 11/26/2018   Shortness of breath 07/17/2017   Sinus arrhythmia 11/26/2018   Swelling 05/29/2017   Tobacco abuse    Type 2 diabetes mellitus without complication, without long-term current use of insulin (HCC) 05/29/2017   Uncontrolled type 2 diabetes mellitus with peripheral neuropathy     Past Surgical History:  Procedure Laterality Date    HERNIA REPAIR  1998   Abdominal   LEFT HEART CATH AND CORONARY ANGIOGRAPHY N/A 10/13/2019   Procedure: LEFT HEART CATH AND CORONARY ANGIOGRAPHY;  Surgeon: Yvonne Kendall, MD;  Location: [facility];  Service: Cardiovascular;  Laterality: N/A;   MINOR HEMORRHOIDECTOMY  1998   NASAL SINUS SURGERY  1978   SHOULDER SURGERY Left    X2   TONSILLECTOMY  1967    Current Medications: No outpatient medications have been marked as taking for the 07/04/22 encounter (Appointment) with Baldo Daub, MD.     Allergies:   Pseudoephedrine, Penicillins, and Tetracyclines & related   Social History   Socioeconomic History   Marital status: Married    Spouse name: Not on file   Number of children: Not on file   Years of education: Not on file   Highest education level: Not on file  Occupational History   Not on file  Tobacco Use   Smoking status: Former   Smokeless tobacco: Former    Types: Chew  Substance and Sexual Activity   Alcohol use: Not Currently   Drug use: Never   Sexual activity: Not on file  Other Topics Concern   Not on file  Social History Narrative   Not on file   Social Determinants of Health   Financial Resource Strain: Not on file  Food Insecurity: Not on file  Transportation Needs: Not on file  Physical Activity: Not on file  Stress: Not on file  Social Connections: Not on file     Family History: The patient's ***family history includes Alcohol abuse in his father; Diabetes in his brother; Lung cancer in his father; Stomach cancer in his maternal grandmother and paternal grandmother; Stroke in his father. There is no history of Colon cancer, Esophageal cancer, or Rectal cancer. ROS:   Please see the history of present illness.    All other systems reviewed and are negative.  EKGs/Labs/Other Studies Reviewed:    The following studies were reviewed today:  Cardiac Studies & Procedures   CARDIAC CATHETERIZATION  CARDIAC CATHETERIZATION  10/13/2019  Narrative Conclusions: 1. Moderate to severe, non-critical two vessel coronary artery disease with sequential 50% proximal/mid LAD stenoses as well as tandem 60-70% proximal RCA lesions.  There is also a 70% stenosis involving rPL1. 2. Grossly normal left ventricular systolic function with moderately elevated filling pressure (LVEDP 25-30 mmHg). 3. Tortuous right subclavian artery limiting catheter manipulation.  Consider use of a long radial sheath versus alternate access if catheterization is needed in the future.  Recommendations: 1. Given recent severe anemia and improving symptoms with rising hemoglobin, as well as moderately elevated LVEDP, I favor optimization of medical therapy.  I will increase furosemide to 40 mg BID and add isosorbide mononitrate 30 mg daily.  If Mr. Leibensperger has refractory symptoms despite optimal medical therapy, PCI to the RCA could be considered.  He should be challenged with dual antiplatelet therapy before proceeding with PCI to ensure that he does not experience recurrent GI bleeding. 2. Continue aggressive secondary prevention of coronary artery disease.  Yvonne Kendall, MD [facility]  Findings Coronary Findings Diagnostic  Dominance: Right  Left Main Vessel is large. Vessel is angiographically normal.  Left Anterior Descending Vessel is large. Prox LAD to Mid LAD lesion is 50% stenosed. Mid LAD lesion is 50% stenosed.  First Diagonal Branch Vessel is small in size.  Second Diagonal Branch Vessel is small in size.  Third Diagonal Branch Vessel is moderate in size.  Left Circumflex Vessel is large. Prox Cx lesion is 20% stenosed.  First Obtuse Marginal Branch Vessel is small in size.  Second Obtuse Marginal Branch Vessel is moderate in size.  Third Obtuse Marginal Branch Vessel is large in size.  Fourth Obtuse Marginal Branch Vessel is moderate in size.  Right Coronary Artery Vessel is moderate in size. Prox RCA-1  lesion is 60% stenosed. Prox RCA-2 lesion is 70% stenosed.  Right Posterior Descending Artery Vessel is small in size.  First Right Posterolateral Branch Vessel is moderate in size. 1st RPL lesion is 70% stenosed.  Intervention  No interventions have been documented.   STRESS TESTS  MYOCARDIAL PERFUSION IMAGING 12/16/2018  Narrative  The left ventricular ejection fraction is normal (55-65%).  Nuclear stress EF: 56%.  There was no ST segment deviation noted during stress.  This is a low risk study.  No evidence of ischemia or MI  Normal EF   ECHOCARDIOGRAM  ECHOCARDIOGRAM COMPLETE 08/16/2020  Narrative ECHOCARDIOGRAM REPORT    Patient Name:   ROARK HOLIK Date of Exam: 08/16/2020 Medical Rec #:  [id number]      Height:       71.0 in Accession #:    [id number]     Weight:       275.2 lb Date of Birth:  Jun 02, 1950       BSA:          2.415 m Patient Age:    12

## 2022-07-04 ENCOUNTER — Ambulatory Visit: Payer: Medicare HMO | Attending: Cardiology | Admitting: Cardiology

## 2022-07-08 DIAGNOSIS — H5203 Hypermetropia, bilateral: Secondary | ICD-10-CM | POA: Diagnosis not present

## 2022-07-08 DIAGNOSIS — E113292 Type 2 diabetes mellitus with mild nonproliferative diabetic retinopathy without macular edema, left eye: Secondary | ICD-10-CM | POA: Diagnosis not present

## 2022-07-08 DIAGNOSIS — Z794 Long term (current) use of insulin: Secondary | ICD-10-CM | POA: Diagnosis not present

## 2022-07-28 DIAGNOSIS — Z743 Need for continuous supervision: Secondary | ICD-10-CM | POA: Diagnosis not present

## 2022-07-28 DIAGNOSIS — R531 Weakness: Secondary | ICD-10-CM | POA: Diagnosis not present

## 2022-07-28 DIAGNOSIS — R9431 Abnormal electrocardiogram [ECG] [EKG]: Secondary | ICD-10-CM | POA: Diagnosis not present

## 2022-07-28 DIAGNOSIS — N400 Enlarged prostate without lower urinary tract symptoms: Secondary | ICD-10-CM | POA: Diagnosis not present

## 2022-07-28 DIAGNOSIS — M109 Gout, unspecified: Secondary | ICD-10-CM | POA: Diagnosis not present

## 2022-07-28 DIAGNOSIS — I639 Cerebral infarction, unspecified: Secondary | ICD-10-CM | POA: Diagnosis not present

## 2022-07-28 DIAGNOSIS — W19XXXA Unspecified fall, initial encounter: Secondary | ICD-10-CM | POA: Diagnosis not present

## 2022-07-28 DIAGNOSIS — I6523 Occlusion and stenosis of bilateral carotid arteries: Secondary | ICD-10-CM | POA: Diagnosis not present

## 2022-07-28 DIAGNOSIS — R29898 Other symptoms and signs involving the musculoskeletal system: Secondary | ICD-10-CM | POA: Diagnosis not present

## 2022-07-28 DIAGNOSIS — R29818 Other symptoms and signs involving the nervous system: Secondary | ICD-10-CM | POA: Diagnosis not present

## 2022-07-28 DIAGNOSIS — E1165 Type 2 diabetes mellitus with hyperglycemia: Secondary | ICD-10-CM | POA: Diagnosis not present

## 2022-07-28 DIAGNOSIS — I10 Essential (primary) hypertension: Secondary | ICD-10-CM | POA: Diagnosis not present

## 2022-07-28 DIAGNOSIS — I4510 Unspecified right bundle-branch block: Secondary | ICD-10-CM | POA: Diagnosis not present

## 2022-07-28 DIAGNOSIS — J449 Chronic obstructive pulmonary disease, unspecified: Secondary | ICD-10-CM | POA: Diagnosis not present

## 2022-07-28 DIAGNOSIS — I6782 Cerebral ischemia: Secondary | ICD-10-CM | POA: Diagnosis not present

## 2022-07-28 DIAGNOSIS — I440 Atrioventricular block, first degree: Secondary | ICD-10-CM | POA: Diagnosis not present

## 2022-07-28 DIAGNOSIS — E7800 Pure hypercholesterolemia, unspecified: Secondary | ICD-10-CM | POA: Diagnosis not present

## 2022-07-28 DIAGNOSIS — M1990 Unspecified osteoarthritis, unspecified site: Secondary | ICD-10-CM | POA: Diagnosis not present

## 2022-07-29 DIAGNOSIS — I10 Essential (primary) hypertension: Secondary | ICD-10-CM | POA: Diagnosis not present

## 2022-08-01 DIAGNOSIS — Z7401 Bed confinement status: Secondary | ICD-10-CM | POA: Diagnosis not present

## 2022-08-01 DIAGNOSIS — R2681 Unsteadiness on feet: Secondary | ICD-10-CM | POA: Diagnosis not present

## 2022-08-01 DIAGNOSIS — R262 Difficulty in walking, not elsewhere classified: Secondary | ICD-10-CM | POA: Diagnosis not present

## 2022-08-01 DIAGNOSIS — J302 Other seasonal allergic rhinitis: Secondary | ICD-10-CM | POA: Diagnosis not present

## 2022-08-01 DIAGNOSIS — I119 Hypertensive heart disease without heart failure: Secondary | ICD-10-CM | POA: Diagnosis not present

## 2022-08-01 DIAGNOSIS — K922 Gastrointestinal hemorrhage, unspecified: Secondary | ICD-10-CM | POA: Diagnosis not present

## 2022-08-01 DIAGNOSIS — E1165 Type 2 diabetes mellitus with hyperglycemia: Secondary | ICD-10-CM | POA: Diagnosis not present

## 2022-08-01 DIAGNOSIS — I10 Essential (primary) hypertension: Secondary | ICD-10-CM | POA: Diagnosis not present

## 2022-08-01 DIAGNOSIS — E1151 Type 2 diabetes mellitus with diabetic peripheral angiopathy without gangrene: Secondary | ICD-10-CM | POA: Diagnosis not present

## 2022-08-01 DIAGNOSIS — R531 Weakness: Secondary | ICD-10-CM | POA: Diagnosis not present

## 2022-08-01 DIAGNOSIS — R2689 Other abnormalities of gait and mobility: Secondary | ICD-10-CM | POA: Diagnosis not present

## 2022-08-01 DIAGNOSIS — M6281 Muscle weakness (generalized): Secondary | ICD-10-CM | POA: Diagnosis not present

## 2022-08-01 DIAGNOSIS — F4320 Adjustment disorder, unspecified: Secondary | ICD-10-CM | POA: Diagnosis not present

## 2022-08-01 DIAGNOSIS — F32A Depression, unspecified: Secondary | ICD-10-CM | POA: Diagnosis not present

## 2022-08-01 DIAGNOSIS — Z743 Need for continuous supervision: Secondary | ICD-10-CM | POA: Diagnosis not present

## 2022-08-01 DIAGNOSIS — E1140 Type 2 diabetes mellitus with diabetic neuropathy, unspecified: Secondary | ICD-10-CM | POA: Diagnosis not present

## 2022-08-01 DIAGNOSIS — M1A9XX0 Chronic gout, unspecified, without tophus (tophi): Secondary | ICD-10-CM | POA: Diagnosis not present

## 2022-08-01 DIAGNOSIS — E785 Hyperlipidemia, unspecified: Secondary | ICD-10-CM | POA: Diagnosis not present

## 2022-08-01 DIAGNOSIS — I69354 Hemiplegia and hemiparesis following cerebral infarction affecting left non-dominant side: Secondary | ICD-10-CM | POA: Diagnosis not present

## 2022-08-01 DIAGNOSIS — F411 Generalized anxiety disorder: Secondary | ICD-10-CM | POA: Diagnosis not present

## 2022-08-01 DIAGNOSIS — I639 Cerebral infarction, unspecified: Secondary | ICD-10-CM | POA: Diagnosis not present

## 2022-08-04 DIAGNOSIS — I639 Cerebral infarction, unspecified: Secondary | ICD-10-CM | POA: Diagnosis not present

## 2022-08-04 DIAGNOSIS — R262 Difficulty in walking, not elsewhere classified: Secondary | ICD-10-CM | POA: Diagnosis not present

## 2022-08-04 DIAGNOSIS — E1151 Type 2 diabetes mellitus with diabetic peripheral angiopathy without gangrene: Secondary | ICD-10-CM | POA: Diagnosis not present

## 2022-08-04 DIAGNOSIS — I119 Hypertensive heart disease without heart failure: Secondary | ICD-10-CM | POA: Diagnosis not present

## 2022-08-04 HISTORY — DX: Cerebral infarction, unspecified: I63.9

## 2022-08-14 DIAGNOSIS — F4320 Adjustment disorder, unspecified: Secondary | ICD-10-CM | POA: Diagnosis not present

## 2022-08-14 DIAGNOSIS — F411 Generalized anxiety disorder: Secondary | ICD-10-CM | POA: Diagnosis not present

## 2022-08-14 DIAGNOSIS — F32A Depression, unspecified: Secondary | ICD-10-CM | POA: Diagnosis not present

## 2022-08-19 ENCOUNTER — Other Ambulatory Visit: Payer: Self-pay | Admitting: *Deleted

## 2022-08-19 NOTE — Patient Outreach (Signed)
Per [facility] Mrs. Farver resides in [facility] skilled nursing facility. Screening for potential care coordination services as benefit of health plan and Primary Care Provider.   Secure communication sent to Shanda Bumps, [employer], to collaborate about transition plans and potential care coordination needs.   Will continue to follow.  Raiford Noble, MSN, RN,BSN [facility] Coordinator [phone] (Direct dial)

## 2022-08-20 DIAGNOSIS — I69354 Hemiplegia and hemiparesis following cerebral infarction affecting left non-dominant side: Secondary | ICD-10-CM | POA: Diagnosis not present

## 2022-08-23 DIAGNOSIS — D509 Iron deficiency anemia, unspecified: Secondary | ICD-10-CM | POA: Diagnosis not present

## 2022-08-23 DIAGNOSIS — K148 Other diseases of tongue: Secondary | ICD-10-CM | POA: Diagnosis not present

## 2022-08-23 DIAGNOSIS — Z791 Long term (current) use of non-steroidal anti-inflammatories (NSAID): Secondary | ICD-10-CM | POA: Diagnosis not present

## 2022-08-23 DIAGNOSIS — D291 Benign neoplasm of prostate: Secondary | ICD-10-CM | POA: Diagnosis not present

## 2022-08-23 DIAGNOSIS — I2510 Atherosclerotic heart disease of native coronary artery without angina pectoris: Secondary | ICD-10-CM | POA: Diagnosis not present

## 2022-08-23 DIAGNOSIS — I639 Cerebral infarction, unspecified: Secondary | ICD-10-CM | POA: Diagnosis not present

## 2022-08-23 DIAGNOSIS — Z7902 Long term (current) use of antithrombotics/antiplatelets: Secondary | ICD-10-CM | POA: Diagnosis not present

## 2022-08-23 DIAGNOSIS — I69354 Hemiplegia and hemiparesis following cerebral infarction affecting left non-dominant side: Secondary | ICD-10-CM | POA: Diagnosis not present

## 2022-08-23 DIAGNOSIS — I69393 Ataxia following cerebral infarction: Secondary | ICD-10-CM | POA: Diagnosis not present

## 2022-08-23 DIAGNOSIS — F419 Anxiety disorder, unspecified: Secondary | ICD-10-CM | POA: Diagnosis not present

## 2022-08-23 DIAGNOSIS — M519 Unspecified thoracic, thoracolumbar and lumbosacral intervertebral disc disorder: Secondary | ICD-10-CM | POA: Diagnosis not present

## 2022-08-23 DIAGNOSIS — E785 Hyperlipidemia, unspecified: Secondary | ICD-10-CM | POA: Diagnosis not present

## 2022-08-23 DIAGNOSIS — K5901 Slow transit constipation: Secondary | ICD-10-CM | POA: Diagnosis not present

## 2022-08-23 DIAGNOSIS — J302 Other seasonal allergic rhinitis: Secondary | ICD-10-CM | POA: Diagnosis not present

## 2022-08-23 DIAGNOSIS — M159 Polyosteoarthritis, unspecified: Secondary | ICD-10-CM | POA: Diagnosis not present

## 2022-08-23 DIAGNOSIS — F3341 Major depressive disorder, recurrent, in partial remission: Secondary | ICD-10-CM | POA: Diagnosis not present

## 2022-08-23 DIAGNOSIS — K635 Polyp of colon: Secondary | ICD-10-CM | POA: Diagnosis not present

## 2022-08-23 DIAGNOSIS — Z794 Long term (current) use of insulin: Secondary | ICD-10-CM | POA: Diagnosis not present

## 2022-08-23 DIAGNOSIS — M1A9XX0 Chronic gout, unspecified, without tophus (tophi): Secondary | ICD-10-CM | POA: Diagnosis not present

## 2022-08-23 DIAGNOSIS — Z7984 Long term (current) use of oral hypoglycemic drugs: Secondary | ICD-10-CM | POA: Diagnosis not present

## 2022-08-23 DIAGNOSIS — K219 Gastro-esophageal reflux disease without esophagitis: Secondary | ICD-10-CM | POA: Diagnosis not present

## 2022-08-23 DIAGNOSIS — I69391 Dysphagia following cerebral infarction: Secondary | ICD-10-CM | POA: Diagnosis not present

## 2022-08-23 DIAGNOSIS — E1142 Type 2 diabetes mellitus with diabetic polyneuropathy: Secondary | ICD-10-CM | POA: Diagnosis not present

## 2022-08-23 DIAGNOSIS — I10 Essential (primary) hypertension: Secondary | ICD-10-CM | POA: Diagnosis not present

## 2022-08-23 DIAGNOSIS — K922 Gastrointestinal hemorrhage, unspecified: Secondary | ICD-10-CM | POA: Diagnosis not present

## 2022-08-23 DIAGNOSIS — E1151 Type 2 diabetes mellitus with diabetic peripheral angiopathy without gangrene: Secondary | ICD-10-CM | POA: Diagnosis not present

## 2022-08-25 DIAGNOSIS — Z791 Long term (current) use of non-steroidal anti-inflammatories (NSAID): Secondary | ICD-10-CM | POA: Diagnosis not present

## 2022-08-25 DIAGNOSIS — I10 Essential (primary) hypertension: Secondary | ICD-10-CM | POA: Diagnosis not present

## 2022-08-25 DIAGNOSIS — I69391 Dysphagia following cerebral infarction: Secondary | ICD-10-CM | POA: Diagnosis not present

## 2022-08-25 DIAGNOSIS — E785 Hyperlipidemia, unspecified: Secondary | ICD-10-CM | POA: Diagnosis not present

## 2022-08-25 DIAGNOSIS — F3341 Major depressive disorder, recurrent, in partial remission: Secondary | ICD-10-CM | POA: Diagnosis not present

## 2022-08-25 DIAGNOSIS — M519 Unspecified thoracic, thoracolumbar and lumbosacral intervertebral disc disorder: Secondary | ICD-10-CM | POA: Diagnosis not present

## 2022-08-25 DIAGNOSIS — Z7902 Long term (current) use of antithrombotics/antiplatelets: Secondary | ICD-10-CM | POA: Diagnosis not present

## 2022-08-25 DIAGNOSIS — I69354 Hemiplegia and hemiparesis following cerebral infarction affecting left non-dominant side: Secondary | ICD-10-CM | POA: Diagnosis not present

## 2022-08-25 DIAGNOSIS — K922 Gastrointestinal hemorrhage, unspecified: Secondary | ICD-10-CM | POA: Diagnosis not present

## 2022-08-25 DIAGNOSIS — E1142 Type 2 diabetes mellitus with diabetic polyneuropathy: Secondary | ICD-10-CM | POA: Diagnosis not present

## 2022-08-25 DIAGNOSIS — J302 Other seasonal allergic rhinitis: Secondary | ICD-10-CM | POA: Diagnosis not present

## 2022-08-25 DIAGNOSIS — K635 Polyp of colon: Secondary | ICD-10-CM | POA: Diagnosis not present

## 2022-08-25 DIAGNOSIS — I2510 Atherosclerotic heart disease of native coronary artery without angina pectoris: Secondary | ICD-10-CM | POA: Diagnosis not present

## 2022-08-25 DIAGNOSIS — Z7984 Long term (current) use of oral hypoglycemic drugs: Secondary | ICD-10-CM | POA: Diagnosis not present

## 2022-08-25 DIAGNOSIS — K219 Gastro-esophageal reflux disease without esophagitis: Secondary | ICD-10-CM | POA: Diagnosis not present

## 2022-08-25 DIAGNOSIS — I69393 Ataxia following cerebral infarction: Secondary | ICD-10-CM | POA: Diagnosis not present

## 2022-08-25 DIAGNOSIS — D291 Benign neoplasm of prostate: Secondary | ICD-10-CM | POA: Diagnosis not present

## 2022-08-25 DIAGNOSIS — M1A9XX0 Chronic gout, unspecified, without tophus (tophi): Secondary | ICD-10-CM | POA: Diagnosis not present

## 2022-08-25 DIAGNOSIS — K148 Other diseases of tongue: Secondary | ICD-10-CM | POA: Diagnosis not present

## 2022-08-25 DIAGNOSIS — F419 Anxiety disorder, unspecified: Secondary | ICD-10-CM | POA: Diagnosis not present

## 2022-08-25 DIAGNOSIS — M159 Polyosteoarthritis, unspecified: Secondary | ICD-10-CM | POA: Diagnosis not present

## 2022-08-25 DIAGNOSIS — D509 Iron deficiency anemia, unspecified: Secondary | ICD-10-CM | POA: Diagnosis not present

## 2022-08-25 DIAGNOSIS — E1151 Type 2 diabetes mellitus with diabetic peripheral angiopathy without gangrene: Secondary | ICD-10-CM | POA: Diagnosis not present

## 2022-08-25 DIAGNOSIS — Z794 Long term (current) use of insulin: Secondary | ICD-10-CM | POA: Diagnosis not present

## 2022-08-25 DIAGNOSIS — K5901 Slow transit constipation: Secondary | ICD-10-CM | POA: Diagnosis not present

## 2022-08-26 DIAGNOSIS — Z794 Long term (current) use of insulin: Secondary | ICD-10-CM | POA: Diagnosis not present

## 2022-08-26 DIAGNOSIS — F3341 Major depressive disorder, recurrent, in partial remission: Secondary | ICD-10-CM | POA: Diagnosis not present

## 2022-08-26 DIAGNOSIS — Z791 Long term (current) use of non-steroidal anti-inflammatories (NSAID): Secondary | ICD-10-CM | POA: Diagnosis not present

## 2022-08-26 DIAGNOSIS — Z7902 Long term (current) use of antithrombotics/antiplatelets: Secondary | ICD-10-CM | POA: Diagnosis not present

## 2022-08-26 DIAGNOSIS — I69354 Hemiplegia and hemiparesis following cerebral infarction affecting left non-dominant side: Secondary | ICD-10-CM | POA: Diagnosis not present

## 2022-08-26 DIAGNOSIS — K5901 Slow transit constipation: Secondary | ICD-10-CM | POA: Diagnosis not present

## 2022-08-26 DIAGNOSIS — I69391 Dysphagia following cerebral infarction: Secondary | ICD-10-CM | POA: Diagnosis not present

## 2022-08-26 DIAGNOSIS — E1151 Type 2 diabetes mellitus with diabetic peripheral angiopathy without gangrene: Secondary | ICD-10-CM | POA: Diagnosis not present

## 2022-08-26 DIAGNOSIS — K219 Gastro-esophageal reflux disease without esophagitis: Secondary | ICD-10-CM | POA: Diagnosis not present

## 2022-08-26 DIAGNOSIS — E785 Hyperlipidemia, unspecified: Secondary | ICD-10-CM | POA: Diagnosis not present

## 2022-08-26 DIAGNOSIS — J302 Other seasonal allergic rhinitis: Secondary | ICD-10-CM | POA: Diagnosis not present

## 2022-08-26 DIAGNOSIS — D291 Benign neoplasm of prostate: Secondary | ICD-10-CM | POA: Diagnosis not present

## 2022-08-26 DIAGNOSIS — M519 Unspecified thoracic, thoracolumbar and lumbosacral intervertebral disc disorder: Secondary | ICD-10-CM | POA: Diagnosis not present

## 2022-08-26 DIAGNOSIS — I69393 Ataxia following cerebral infarction: Secondary | ICD-10-CM | POA: Diagnosis not present

## 2022-08-26 DIAGNOSIS — I2510 Atherosclerotic heart disease of native coronary artery without angina pectoris: Secondary | ICD-10-CM | POA: Diagnosis not present

## 2022-08-26 DIAGNOSIS — M1A9XX0 Chronic gout, unspecified, without tophus (tophi): Secondary | ICD-10-CM | POA: Diagnosis not present

## 2022-08-26 DIAGNOSIS — K922 Gastrointestinal hemorrhage, unspecified: Secondary | ICD-10-CM | POA: Diagnosis not present

## 2022-08-26 DIAGNOSIS — F419 Anxiety disorder, unspecified: Secondary | ICD-10-CM | POA: Diagnosis not present

## 2022-08-26 DIAGNOSIS — D509 Iron deficiency anemia, unspecified: Secondary | ICD-10-CM | POA: Diagnosis not present

## 2022-08-26 DIAGNOSIS — E1142 Type 2 diabetes mellitus with diabetic polyneuropathy: Secondary | ICD-10-CM | POA: Diagnosis not present

## 2022-08-26 DIAGNOSIS — K148 Other diseases of tongue: Secondary | ICD-10-CM | POA: Diagnosis not present

## 2022-08-26 DIAGNOSIS — K635 Polyp of colon: Secondary | ICD-10-CM | POA: Diagnosis not present

## 2022-08-26 DIAGNOSIS — Z7984 Long term (current) use of oral hypoglycemic drugs: Secondary | ICD-10-CM | POA: Diagnosis not present

## 2022-08-26 DIAGNOSIS — M159 Polyosteoarthritis, unspecified: Secondary | ICD-10-CM | POA: Diagnosis not present

## 2022-08-26 DIAGNOSIS — I10 Essential (primary) hypertension: Secondary | ICD-10-CM | POA: Diagnosis not present

## 2022-08-28 DIAGNOSIS — I69354 Hemiplegia and hemiparesis following cerebral infarction affecting left non-dominant side: Secondary | ICD-10-CM | POA: Diagnosis not present

## 2022-08-28 DIAGNOSIS — M1A9XX0 Chronic gout, unspecified, without tophus (tophi): Secondary | ICD-10-CM | POA: Diagnosis not present

## 2022-08-28 DIAGNOSIS — K922 Gastrointestinal hemorrhage, unspecified: Secondary | ICD-10-CM | POA: Diagnosis not present

## 2022-08-28 DIAGNOSIS — J302 Other seasonal allergic rhinitis: Secondary | ICD-10-CM | POA: Diagnosis not present

## 2022-08-28 DIAGNOSIS — K635 Polyp of colon: Secondary | ICD-10-CM | POA: Diagnosis not present

## 2022-08-28 DIAGNOSIS — E1142 Type 2 diabetes mellitus with diabetic polyneuropathy: Secondary | ICD-10-CM | POA: Diagnosis not present

## 2022-08-28 DIAGNOSIS — E1151 Type 2 diabetes mellitus with diabetic peripheral angiopathy without gangrene: Secondary | ICD-10-CM | POA: Diagnosis not present

## 2022-08-28 DIAGNOSIS — Z791 Long term (current) use of non-steroidal anti-inflammatories (NSAID): Secondary | ICD-10-CM | POA: Diagnosis not present

## 2022-08-28 DIAGNOSIS — D291 Benign neoplasm of prostate: Secondary | ICD-10-CM | POA: Diagnosis not present

## 2022-08-28 DIAGNOSIS — I10 Essential (primary) hypertension: Secondary | ICD-10-CM | POA: Diagnosis not present

## 2022-08-28 DIAGNOSIS — Z7984 Long term (current) use of oral hypoglycemic drugs: Secondary | ICD-10-CM | POA: Diagnosis not present

## 2022-08-28 DIAGNOSIS — K219 Gastro-esophageal reflux disease without esophagitis: Secondary | ICD-10-CM | POA: Diagnosis not present

## 2022-08-28 DIAGNOSIS — M519 Unspecified thoracic, thoracolumbar and lumbosacral intervertebral disc disorder: Secondary | ICD-10-CM | POA: Diagnosis not present

## 2022-08-28 DIAGNOSIS — M159 Polyosteoarthritis, unspecified: Secondary | ICD-10-CM | POA: Diagnosis not present

## 2022-08-28 DIAGNOSIS — F419 Anxiety disorder, unspecified: Secondary | ICD-10-CM | POA: Diagnosis not present

## 2022-08-28 DIAGNOSIS — E785 Hyperlipidemia, unspecified: Secondary | ICD-10-CM | POA: Diagnosis not present

## 2022-08-28 DIAGNOSIS — K5901 Slow transit constipation: Secondary | ICD-10-CM | POA: Diagnosis not present

## 2022-08-28 DIAGNOSIS — Z794 Long term (current) use of insulin: Secondary | ICD-10-CM | POA: Diagnosis not present

## 2022-08-28 DIAGNOSIS — I69393 Ataxia following cerebral infarction: Secondary | ICD-10-CM | POA: Diagnosis not present

## 2022-08-28 DIAGNOSIS — I2510 Atherosclerotic heart disease of native coronary artery without angina pectoris: Secondary | ICD-10-CM | POA: Diagnosis not present

## 2022-08-28 DIAGNOSIS — F3341 Major depressive disorder, recurrent, in partial remission: Secondary | ICD-10-CM | POA: Diagnosis not present

## 2022-08-28 DIAGNOSIS — K148 Other diseases of tongue: Secondary | ICD-10-CM | POA: Diagnosis not present

## 2022-08-28 DIAGNOSIS — I69391 Dysphagia following cerebral infarction: Secondary | ICD-10-CM | POA: Diagnosis not present

## 2022-08-28 DIAGNOSIS — Z7902 Long term (current) use of antithrombotics/antiplatelets: Secondary | ICD-10-CM | POA: Diagnosis not present

## 2022-08-28 DIAGNOSIS — D509 Iron deficiency anemia, unspecified: Secondary | ICD-10-CM | POA: Diagnosis not present

## 2022-08-29 DIAGNOSIS — I10 Essential (primary) hypertension: Secondary | ICD-10-CM | POA: Diagnosis not present

## 2022-08-29 DIAGNOSIS — E785 Hyperlipidemia, unspecified: Secondary | ICD-10-CM | POA: Diagnosis not present

## 2022-08-29 DIAGNOSIS — E1142 Type 2 diabetes mellitus with diabetic polyneuropathy: Secondary | ICD-10-CM | POA: Diagnosis not present

## 2022-08-29 DIAGNOSIS — I69354 Hemiplegia and hemiparesis following cerebral infarction affecting left non-dominant side: Secondary | ICD-10-CM | POA: Diagnosis not present

## 2022-08-29 DIAGNOSIS — Z8719 Personal history of other diseases of the digestive system: Secondary | ICD-10-CM | POA: Diagnosis not present

## 2022-09-02 DIAGNOSIS — I69391 Dysphagia following cerebral infarction: Secondary | ICD-10-CM | POA: Diagnosis not present

## 2022-09-02 DIAGNOSIS — M519 Unspecified thoracic, thoracolumbar and lumbosacral intervertebral disc disorder: Secondary | ICD-10-CM | POA: Diagnosis not present

## 2022-09-02 DIAGNOSIS — K148 Other diseases of tongue: Secondary | ICD-10-CM | POA: Diagnosis not present

## 2022-09-02 DIAGNOSIS — K219 Gastro-esophageal reflux disease without esophagitis: Secondary | ICD-10-CM | POA: Diagnosis not present

## 2022-09-02 DIAGNOSIS — F419 Anxiety disorder, unspecified: Secondary | ICD-10-CM | POA: Diagnosis not present

## 2022-09-02 DIAGNOSIS — I69354 Hemiplegia and hemiparesis following cerebral infarction affecting left non-dominant side: Secondary | ICD-10-CM | POA: Diagnosis not present

## 2022-09-02 DIAGNOSIS — D509 Iron deficiency anemia, unspecified: Secondary | ICD-10-CM | POA: Diagnosis not present

## 2022-09-02 DIAGNOSIS — Z7902 Long term (current) use of antithrombotics/antiplatelets: Secondary | ICD-10-CM | POA: Diagnosis not present

## 2022-09-02 DIAGNOSIS — K5901 Slow transit constipation: Secondary | ICD-10-CM | POA: Diagnosis not present

## 2022-09-02 DIAGNOSIS — E785 Hyperlipidemia, unspecified: Secondary | ICD-10-CM | POA: Diagnosis not present

## 2022-09-02 DIAGNOSIS — K922 Gastrointestinal hemorrhage, unspecified: Secondary | ICD-10-CM | POA: Diagnosis not present

## 2022-09-02 DIAGNOSIS — F3341 Major depressive disorder, recurrent, in partial remission: Secondary | ICD-10-CM | POA: Diagnosis not present

## 2022-09-02 DIAGNOSIS — D291 Benign neoplasm of prostate: Secondary | ICD-10-CM | POA: Diagnosis not present

## 2022-09-02 DIAGNOSIS — I10 Essential (primary) hypertension: Secondary | ICD-10-CM | POA: Diagnosis not present

## 2022-09-02 DIAGNOSIS — Z7984 Long term (current) use of oral hypoglycemic drugs: Secondary | ICD-10-CM | POA: Diagnosis not present

## 2022-09-02 DIAGNOSIS — J302 Other seasonal allergic rhinitis: Secondary | ICD-10-CM | POA: Diagnosis not present

## 2022-09-02 DIAGNOSIS — I69393 Ataxia following cerebral infarction: Secondary | ICD-10-CM | POA: Diagnosis not present

## 2022-09-02 DIAGNOSIS — Z791 Long term (current) use of non-steroidal anti-inflammatories (NSAID): Secondary | ICD-10-CM | POA: Diagnosis not present

## 2022-09-02 DIAGNOSIS — I2510 Atherosclerotic heart disease of native coronary artery without angina pectoris: Secondary | ICD-10-CM | POA: Diagnosis not present

## 2022-09-02 DIAGNOSIS — M1A9XX0 Chronic gout, unspecified, without tophus (tophi): Secondary | ICD-10-CM | POA: Diagnosis not present

## 2022-09-02 DIAGNOSIS — E1151 Type 2 diabetes mellitus with diabetic peripheral angiopathy without gangrene: Secondary | ICD-10-CM | POA: Diagnosis not present

## 2022-09-02 DIAGNOSIS — K635 Polyp of colon: Secondary | ICD-10-CM | POA: Diagnosis not present

## 2022-09-02 DIAGNOSIS — M159 Polyosteoarthritis, unspecified: Secondary | ICD-10-CM | POA: Diagnosis not present

## 2022-09-02 DIAGNOSIS — E1142 Type 2 diabetes mellitus with diabetic polyneuropathy: Secondary | ICD-10-CM | POA: Diagnosis not present

## 2022-09-02 DIAGNOSIS — Z794 Long term (current) use of insulin: Secondary | ICD-10-CM | POA: Diagnosis not present

## 2022-09-04 DIAGNOSIS — I10 Essential (primary) hypertension: Secondary | ICD-10-CM | POA: Diagnosis not present

## 2022-09-04 DIAGNOSIS — E1142 Type 2 diabetes mellitus with diabetic polyneuropathy: Secondary | ICD-10-CM | POA: Diagnosis not present

## 2022-09-04 DIAGNOSIS — Z794 Long term (current) use of insulin: Secondary | ICD-10-CM | POA: Diagnosis not present

## 2022-09-04 DIAGNOSIS — Z7902 Long term (current) use of antithrombotics/antiplatelets: Secondary | ICD-10-CM | POA: Diagnosis not present

## 2022-09-04 DIAGNOSIS — K635 Polyp of colon: Secondary | ICD-10-CM | POA: Diagnosis not present

## 2022-09-04 DIAGNOSIS — F419 Anxiety disorder, unspecified: Secondary | ICD-10-CM | POA: Diagnosis not present

## 2022-09-04 DIAGNOSIS — M159 Polyosteoarthritis, unspecified: Secondary | ICD-10-CM | POA: Diagnosis not present

## 2022-09-04 DIAGNOSIS — M1A9XX0 Chronic gout, unspecified, without tophus (tophi): Secondary | ICD-10-CM | POA: Diagnosis not present

## 2022-09-04 DIAGNOSIS — M519 Unspecified thoracic, thoracolumbar and lumbosacral intervertebral disc disorder: Secondary | ICD-10-CM | POA: Diagnosis not present

## 2022-09-04 DIAGNOSIS — Z7984 Long term (current) use of oral hypoglycemic drugs: Secondary | ICD-10-CM | POA: Diagnosis not present

## 2022-09-04 DIAGNOSIS — D509 Iron deficiency anemia, unspecified: Secondary | ICD-10-CM | POA: Diagnosis not present

## 2022-09-04 DIAGNOSIS — I69393 Ataxia following cerebral infarction: Secondary | ICD-10-CM | POA: Diagnosis not present

## 2022-09-04 DIAGNOSIS — E1151 Type 2 diabetes mellitus with diabetic peripheral angiopathy without gangrene: Secondary | ICD-10-CM | POA: Diagnosis not present

## 2022-09-04 DIAGNOSIS — F3341 Major depressive disorder, recurrent, in partial remission: Secondary | ICD-10-CM | POA: Diagnosis not present

## 2022-09-04 DIAGNOSIS — I69391 Dysphagia following cerebral infarction: Secondary | ICD-10-CM | POA: Diagnosis not present

## 2022-09-04 DIAGNOSIS — E785 Hyperlipidemia, unspecified: Secondary | ICD-10-CM | POA: Diagnosis not present

## 2022-09-04 DIAGNOSIS — D291 Benign neoplasm of prostate: Secondary | ICD-10-CM | POA: Diagnosis not present

## 2022-09-04 DIAGNOSIS — K922 Gastrointestinal hemorrhage, unspecified: Secondary | ICD-10-CM | POA: Diagnosis not present

## 2022-09-04 DIAGNOSIS — K219 Gastro-esophageal reflux disease without esophagitis: Secondary | ICD-10-CM | POA: Diagnosis not present

## 2022-09-04 DIAGNOSIS — K5901 Slow transit constipation: Secondary | ICD-10-CM | POA: Diagnosis not present

## 2022-09-04 DIAGNOSIS — J302 Other seasonal allergic rhinitis: Secondary | ICD-10-CM | POA: Diagnosis not present

## 2022-09-04 DIAGNOSIS — I69354 Hemiplegia and hemiparesis following cerebral infarction affecting left non-dominant side: Secondary | ICD-10-CM | POA: Diagnosis not present

## 2022-09-04 DIAGNOSIS — I2510 Atherosclerotic heart disease of native coronary artery without angina pectoris: Secondary | ICD-10-CM | POA: Diagnosis not present

## 2022-09-04 DIAGNOSIS — Z791 Long term (current) use of non-steroidal anti-inflammatories (NSAID): Secondary | ICD-10-CM | POA: Diagnosis not present

## 2022-09-04 DIAGNOSIS — K148 Other diseases of tongue: Secondary | ICD-10-CM | POA: Diagnosis not present

## 2022-09-09 DIAGNOSIS — I69354 Hemiplegia and hemiparesis following cerebral infarction affecting left non-dominant side: Secondary | ICD-10-CM | POA: Diagnosis not present

## 2022-09-09 DIAGNOSIS — E785 Hyperlipidemia, unspecified: Secondary | ICD-10-CM | POA: Diagnosis not present

## 2022-09-09 DIAGNOSIS — Z7902 Long term (current) use of antithrombotics/antiplatelets: Secondary | ICD-10-CM | POA: Diagnosis not present

## 2022-09-09 DIAGNOSIS — F3341 Major depressive disorder, recurrent, in partial remission: Secondary | ICD-10-CM | POA: Diagnosis not present

## 2022-09-09 DIAGNOSIS — M519 Unspecified thoracic, thoracolumbar and lumbosacral intervertebral disc disorder: Secondary | ICD-10-CM | POA: Diagnosis not present

## 2022-09-09 DIAGNOSIS — I69393 Ataxia following cerebral infarction: Secondary | ICD-10-CM | POA: Diagnosis not present

## 2022-09-09 DIAGNOSIS — K148 Other diseases of tongue: Secondary | ICD-10-CM | POA: Diagnosis not present

## 2022-09-09 DIAGNOSIS — Z794 Long term (current) use of insulin: Secondary | ICD-10-CM | POA: Diagnosis not present

## 2022-09-09 DIAGNOSIS — D291 Benign neoplasm of prostate: Secondary | ICD-10-CM | POA: Diagnosis not present

## 2022-09-09 DIAGNOSIS — D509 Iron deficiency anemia, unspecified: Secondary | ICD-10-CM | POA: Diagnosis not present

## 2022-09-09 DIAGNOSIS — I69391 Dysphagia following cerebral infarction: Secondary | ICD-10-CM | POA: Diagnosis not present

## 2022-09-09 DIAGNOSIS — I10 Essential (primary) hypertension: Secondary | ICD-10-CM | POA: Diagnosis not present

## 2022-09-09 DIAGNOSIS — I2510 Atherosclerotic heart disease of native coronary artery without angina pectoris: Secondary | ICD-10-CM | POA: Diagnosis not present

## 2022-09-09 DIAGNOSIS — J302 Other seasonal allergic rhinitis: Secondary | ICD-10-CM | POA: Diagnosis not present

## 2022-09-09 DIAGNOSIS — M159 Polyosteoarthritis, unspecified: Secondary | ICD-10-CM | POA: Diagnosis not present

## 2022-09-09 DIAGNOSIS — M1A9XX0 Chronic gout, unspecified, without tophus (tophi): Secondary | ICD-10-CM | POA: Diagnosis not present

## 2022-09-09 DIAGNOSIS — K635 Polyp of colon: Secondary | ICD-10-CM | POA: Diagnosis not present

## 2022-09-09 DIAGNOSIS — Z7984 Long term (current) use of oral hypoglycemic drugs: Secondary | ICD-10-CM | POA: Diagnosis not present

## 2022-09-09 DIAGNOSIS — E1151 Type 2 diabetes mellitus with diabetic peripheral angiopathy without gangrene: Secondary | ICD-10-CM | POA: Diagnosis not present

## 2022-09-09 DIAGNOSIS — K219 Gastro-esophageal reflux disease without esophagitis: Secondary | ICD-10-CM | POA: Diagnosis not present

## 2022-09-09 DIAGNOSIS — K5901 Slow transit constipation: Secondary | ICD-10-CM | POA: Diagnosis not present

## 2022-09-09 DIAGNOSIS — Z791 Long term (current) use of non-steroidal anti-inflammatories (NSAID): Secondary | ICD-10-CM | POA: Diagnosis not present

## 2022-09-09 DIAGNOSIS — E1142 Type 2 diabetes mellitus with diabetic polyneuropathy: Secondary | ICD-10-CM | POA: Diagnosis not present

## 2022-09-09 DIAGNOSIS — K922 Gastrointestinal hemorrhage, unspecified: Secondary | ICD-10-CM | POA: Diagnosis not present

## 2022-09-09 DIAGNOSIS — F419 Anxiety disorder, unspecified: Secondary | ICD-10-CM | POA: Diagnosis not present

## 2022-09-10 DIAGNOSIS — K5901 Slow transit constipation: Secondary | ICD-10-CM | POA: Diagnosis not present

## 2022-09-10 DIAGNOSIS — E1151 Type 2 diabetes mellitus with diabetic peripheral angiopathy without gangrene: Secondary | ICD-10-CM | POA: Diagnosis not present

## 2022-09-10 DIAGNOSIS — M159 Polyosteoarthritis, unspecified: Secondary | ICD-10-CM | POA: Diagnosis not present

## 2022-09-10 DIAGNOSIS — J302 Other seasonal allergic rhinitis: Secondary | ICD-10-CM | POA: Diagnosis not present

## 2022-09-10 DIAGNOSIS — I2510 Atherosclerotic heart disease of native coronary artery without angina pectoris: Secondary | ICD-10-CM | POA: Diagnosis not present

## 2022-09-10 DIAGNOSIS — F3341 Major depressive disorder, recurrent, in partial remission: Secondary | ICD-10-CM | POA: Diagnosis not present

## 2022-09-10 DIAGNOSIS — M519 Unspecified thoracic, thoracolumbar and lumbosacral intervertebral disc disorder: Secondary | ICD-10-CM | POA: Diagnosis not present

## 2022-09-10 DIAGNOSIS — Z791 Long term (current) use of non-steroidal anti-inflammatories (NSAID): Secondary | ICD-10-CM | POA: Diagnosis not present

## 2022-09-10 DIAGNOSIS — E785 Hyperlipidemia, unspecified: Secondary | ICD-10-CM | POA: Diagnosis not present

## 2022-09-10 DIAGNOSIS — I10 Essential (primary) hypertension: Secondary | ICD-10-CM | POA: Diagnosis not present

## 2022-09-10 DIAGNOSIS — I69354 Hemiplegia and hemiparesis following cerebral infarction affecting left non-dominant side: Secondary | ICD-10-CM | POA: Diagnosis not present

## 2022-09-10 DIAGNOSIS — I69393 Ataxia following cerebral infarction: Secondary | ICD-10-CM | POA: Diagnosis not present

## 2022-09-10 DIAGNOSIS — Z794 Long term (current) use of insulin: Secondary | ICD-10-CM | POA: Diagnosis not present

## 2022-09-10 DIAGNOSIS — K148 Other diseases of tongue: Secondary | ICD-10-CM | POA: Diagnosis not present

## 2022-09-10 DIAGNOSIS — K219 Gastro-esophageal reflux disease without esophagitis: Secondary | ICD-10-CM | POA: Diagnosis not present

## 2022-09-10 DIAGNOSIS — D291 Benign neoplasm of prostate: Secondary | ICD-10-CM | POA: Diagnosis not present

## 2022-09-10 DIAGNOSIS — F419 Anxiety disorder, unspecified: Secondary | ICD-10-CM | POA: Diagnosis not present

## 2022-09-10 DIAGNOSIS — K922 Gastrointestinal hemorrhage, unspecified: Secondary | ICD-10-CM | POA: Diagnosis not present

## 2022-09-10 DIAGNOSIS — Z7902 Long term (current) use of antithrombotics/antiplatelets: Secondary | ICD-10-CM | POA: Diagnosis not present

## 2022-09-10 DIAGNOSIS — E1142 Type 2 diabetes mellitus with diabetic polyneuropathy: Secondary | ICD-10-CM | POA: Diagnosis not present

## 2022-09-10 DIAGNOSIS — D509 Iron deficiency anemia, unspecified: Secondary | ICD-10-CM | POA: Diagnosis not present

## 2022-09-10 DIAGNOSIS — Z7984 Long term (current) use of oral hypoglycemic drugs: Secondary | ICD-10-CM | POA: Diagnosis not present

## 2022-09-10 DIAGNOSIS — K635 Polyp of colon: Secondary | ICD-10-CM | POA: Diagnosis not present

## 2022-09-10 DIAGNOSIS — I69391 Dysphagia following cerebral infarction: Secondary | ICD-10-CM | POA: Diagnosis not present

## 2022-09-10 DIAGNOSIS — M1A9XX0 Chronic gout, unspecified, without tophus (tophi): Secondary | ICD-10-CM | POA: Diagnosis not present

## 2022-09-12 DIAGNOSIS — E785 Hyperlipidemia, unspecified: Secondary | ICD-10-CM | POA: Diagnosis not present

## 2022-09-12 DIAGNOSIS — M1A9XX0 Chronic gout, unspecified, without tophus (tophi): Secondary | ICD-10-CM | POA: Diagnosis not present

## 2022-09-12 DIAGNOSIS — Z7902 Long term (current) use of antithrombotics/antiplatelets: Secondary | ICD-10-CM | POA: Diagnosis not present

## 2022-09-12 DIAGNOSIS — I69391 Dysphagia following cerebral infarction: Secondary | ICD-10-CM | POA: Diagnosis not present

## 2022-09-12 DIAGNOSIS — E1142 Type 2 diabetes mellitus with diabetic polyneuropathy: Secondary | ICD-10-CM | POA: Diagnosis not present

## 2022-09-12 DIAGNOSIS — E1151 Type 2 diabetes mellitus with diabetic peripheral angiopathy without gangrene: Secondary | ICD-10-CM | POA: Diagnosis not present

## 2022-09-12 DIAGNOSIS — F419 Anxiety disorder, unspecified: Secondary | ICD-10-CM | POA: Diagnosis not present

## 2022-09-12 DIAGNOSIS — M519 Unspecified thoracic, thoracolumbar and lumbosacral intervertebral disc disorder: Secondary | ICD-10-CM | POA: Diagnosis not present

## 2022-09-12 DIAGNOSIS — K922 Gastrointestinal hemorrhage, unspecified: Secondary | ICD-10-CM | POA: Diagnosis not present

## 2022-09-12 DIAGNOSIS — J302 Other seasonal allergic rhinitis: Secondary | ICD-10-CM | POA: Diagnosis not present

## 2022-09-12 DIAGNOSIS — D509 Iron deficiency anemia, unspecified: Secondary | ICD-10-CM | POA: Diagnosis not present

## 2022-09-12 DIAGNOSIS — K219 Gastro-esophageal reflux disease without esophagitis: Secondary | ICD-10-CM | POA: Diagnosis not present

## 2022-09-12 DIAGNOSIS — K148 Other diseases of tongue: Secondary | ICD-10-CM | POA: Diagnosis not present

## 2022-09-12 DIAGNOSIS — D291 Benign neoplasm of prostate: Secondary | ICD-10-CM | POA: Diagnosis not present

## 2022-09-12 DIAGNOSIS — M159 Polyosteoarthritis, unspecified: Secondary | ICD-10-CM | POA: Diagnosis not present

## 2022-09-12 DIAGNOSIS — I2510 Atherosclerotic heart disease of native coronary artery without angina pectoris: Secondary | ICD-10-CM | POA: Diagnosis not present

## 2022-09-12 DIAGNOSIS — K5901 Slow transit constipation: Secondary | ICD-10-CM | POA: Diagnosis not present

## 2022-09-12 DIAGNOSIS — I10 Essential (primary) hypertension: Secondary | ICD-10-CM | POA: Diagnosis not present

## 2022-09-12 DIAGNOSIS — Z794 Long term (current) use of insulin: Secondary | ICD-10-CM | POA: Diagnosis not present

## 2022-09-12 DIAGNOSIS — F3341 Major depressive disorder, recurrent, in partial remission: Secondary | ICD-10-CM | POA: Diagnosis not present

## 2022-09-12 DIAGNOSIS — I69354 Hemiplegia and hemiparesis following cerebral infarction affecting left non-dominant side: Secondary | ICD-10-CM | POA: Diagnosis not present

## 2022-09-12 DIAGNOSIS — Z791 Long term (current) use of non-steroidal anti-inflammatories (NSAID): Secondary | ICD-10-CM | POA: Diagnosis not present

## 2022-09-12 DIAGNOSIS — I69393 Ataxia following cerebral infarction: Secondary | ICD-10-CM | POA: Diagnosis not present

## 2022-09-12 DIAGNOSIS — K635 Polyp of colon: Secondary | ICD-10-CM | POA: Diagnosis not present

## 2022-09-12 DIAGNOSIS — Z7984 Long term (current) use of oral hypoglycemic drugs: Secondary | ICD-10-CM | POA: Diagnosis not present

## 2022-09-15 DIAGNOSIS — I69391 Dysphagia following cerebral infarction: Secondary | ICD-10-CM | POA: Diagnosis not present

## 2022-09-15 DIAGNOSIS — Z7984 Long term (current) use of oral hypoglycemic drugs: Secondary | ICD-10-CM | POA: Diagnosis not present

## 2022-09-15 DIAGNOSIS — F3341 Major depressive disorder, recurrent, in partial remission: Secondary | ICD-10-CM | POA: Diagnosis not present

## 2022-09-15 DIAGNOSIS — Z791 Long term (current) use of non-steroidal anti-inflammatories (NSAID): Secondary | ICD-10-CM | POA: Diagnosis not present

## 2022-09-15 DIAGNOSIS — I10 Essential (primary) hypertension: Secondary | ICD-10-CM | POA: Diagnosis not present

## 2022-09-15 DIAGNOSIS — K219 Gastro-esophageal reflux disease without esophagitis: Secondary | ICD-10-CM | POA: Diagnosis not present

## 2022-09-15 DIAGNOSIS — E1151 Type 2 diabetes mellitus with diabetic peripheral angiopathy without gangrene: Secondary | ICD-10-CM | POA: Diagnosis not present

## 2022-09-15 DIAGNOSIS — M159 Polyosteoarthritis, unspecified: Secondary | ICD-10-CM | POA: Diagnosis not present

## 2022-09-15 DIAGNOSIS — I69393 Ataxia following cerebral infarction: Secondary | ICD-10-CM | POA: Diagnosis not present

## 2022-09-15 DIAGNOSIS — J302 Other seasonal allergic rhinitis: Secondary | ICD-10-CM | POA: Diagnosis not present

## 2022-09-15 DIAGNOSIS — K148 Other diseases of tongue: Secondary | ICD-10-CM | POA: Diagnosis not present

## 2022-09-15 DIAGNOSIS — D509 Iron deficiency anemia, unspecified: Secondary | ICD-10-CM | POA: Diagnosis not present

## 2022-09-15 DIAGNOSIS — E1142 Type 2 diabetes mellitus with diabetic polyneuropathy: Secondary | ICD-10-CM | POA: Diagnosis not present

## 2022-09-15 DIAGNOSIS — D291 Benign neoplasm of prostate: Secondary | ICD-10-CM | POA: Diagnosis not present

## 2022-09-15 DIAGNOSIS — Z7902 Long term (current) use of antithrombotics/antiplatelets: Secondary | ICD-10-CM | POA: Diagnosis not present

## 2022-09-15 DIAGNOSIS — M519 Unspecified thoracic, thoracolumbar and lumbosacral intervertebral disc disorder: Secondary | ICD-10-CM | POA: Diagnosis not present

## 2022-09-15 DIAGNOSIS — K922 Gastrointestinal hemorrhage, unspecified: Secondary | ICD-10-CM | POA: Diagnosis not present

## 2022-09-15 DIAGNOSIS — I2510 Atherosclerotic heart disease of native coronary artery without angina pectoris: Secondary | ICD-10-CM | POA: Diagnosis not present

## 2022-09-15 DIAGNOSIS — I69354 Hemiplegia and hemiparesis following cerebral infarction affecting left non-dominant side: Secondary | ICD-10-CM | POA: Diagnosis not present

## 2022-09-15 DIAGNOSIS — Z794 Long term (current) use of insulin: Secondary | ICD-10-CM | POA: Diagnosis not present

## 2022-09-15 DIAGNOSIS — K635 Polyp of colon: Secondary | ICD-10-CM | POA: Diagnosis not present

## 2022-09-15 DIAGNOSIS — K5901 Slow transit constipation: Secondary | ICD-10-CM | POA: Diagnosis not present

## 2022-09-15 DIAGNOSIS — F419 Anxiety disorder, unspecified: Secondary | ICD-10-CM | POA: Diagnosis not present

## 2022-09-15 DIAGNOSIS — M1A9XX0 Chronic gout, unspecified, without tophus (tophi): Secondary | ICD-10-CM | POA: Diagnosis not present

## 2022-09-15 DIAGNOSIS — E785 Hyperlipidemia, unspecified: Secondary | ICD-10-CM | POA: Diagnosis not present

## 2022-09-16 DIAGNOSIS — I69391 Dysphagia following cerebral infarction: Secondary | ICD-10-CM | POA: Diagnosis not present

## 2022-09-16 DIAGNOSIS — I69354 Hemiplegia and hemiparesis following cerebral infarction affecting left non-dominant side: Secondary | ICD-10-CM | POA: Diagnosis not present

## 2022-09-16 DIAGNOSIS — M1A9XX0 Chronic gout, unspecified, without tophus (tophi): Secondary | ICD-10-CM | POA: Diagnosis not present

## 2022-09-16 DIAGNOSIS — D509 Iron deficiency anemia, unspecified: Secondary | ICD-10-CM | POA: Diagnosis not present

## 2022-09-16 DIAGNOSIS — Z7902 Long term (current) use of antithrombotics/antiplatelets: Secondary | ICD-10-CM | POA: Diagnosis not present

## 2022-09-16 DIAGNOSIS — F419 Anxiety disorder, unspecified: Secondary | ICD-10-CM | POA: Diagnosis not present

## 2022-09-16 DIAGNOSIS — F3341 Major depressive disorder, recurrent, in partial remission: Secondary | ICD-10-CM | POA: Diagnosis not present

## 2022-09-16 DIAGNOSIS — E1142 Type 2 diabetes mellitus with diabetic polyneuropathy: Secondary | ICD-10-CM | POA: Diagnosis not present

## 2022-09-16 DIAGNOSIS — Z794 Long term (current) use of insulin: Secondary | ICD-10-CM | POA: Diagnosis not present

## 2022-09-16 DIAGNOSIS — J302 Other seasonal allergic rhinitis: Secondary | ICD-10-CM | POA: Diagnosis not present

## 2022-09-16 DIAGNOSIS — M159 Polyosteoarthritis, unspecified: Secondary | ICD-10-CM | POA: Diagnosis not present

## 2022-09-16 DIAGNOSIS — D291 Benign neoplasm of prostate: Secondary | ICD-10-CM | POA: Diagnosis not present

## 2022-09-16 DIAGNOSIS — I2510 Atherosclerotic heart disease of native coronary artery without angina pectoris: Secondary | ICD-10-CM | POA: Diagnosis not present

## 2022-09-16 DIAGNOSIS — E1151 Type 2 diabetes mellitus with diabetic peripheral angiopathy without gangrene: Secondary | ICD-10-CM | POA: Diagnosis not present

## 2022-09-16 DIAGNOSIS — M519 Unspecified thoracic, thoracolumbar and lumbosacral intervertebral disc disorder: Secondary | ICD-10-CM | POA: Diagnosis not present

## 2022-09-16 DIAGNOSIS — K922 Gastrointestinal hemorrhage, unspecified: Secondary | ICD-10-CM | POA: Diagnosis not present

## 2022-09-16 DIAGNOSIS — I69393 Ataxia following cerebral infarction: Secondary | ICD-10-CM | POA: Diagnosis not present

## 2022-09-16 DIAGNOSIS — K635 Polyp of colon: Secondary | ICD-10-CM | POA: Diagnosis not present

## 2022-09-16 DIAGNOSIS — Z791 Long term (current) use of non-steroidal anti-inflammatories (NSAID): Secondary | ICD-10-CM | POA: Diagnosis not present

## 2022-09-16 DIAGNOSIS — K219 Gastro-esophageal reflux disease without esophagitis: Secondary | ICD-10-CM | POA: Diagnosis not present

## 2022-09-16 DIAGNOSIS — K148 Other diseases of tongue: Secondary | ICD-10-CM | POA: Diagnosis not present

## 2022-09-16 DIAGNOSIS — I10 Essential (primary) hypertension: Secondary | ICD-10-CM | POA: Diagnosis not present

## 2022-09-16 DIAGNOSIS — K5901 Slow transit constipation: Secondary | ICD-10-CM | POA: Diagnosis not present

## 2022-09-16 DIAGNOSIS — E785 Hyperlipidemia, unspecified: Secondary | ICD-10-CM | POA: Diagnosis not present

## 2022-09-16 DIAGNOSIS — Z7984 Long term (current) use of oral hypoglycemic drugs: Secondary | ICD-10-CM | POA: Diagnosis not present

## 2022-09-18 DIAGNOSIS — E785 Hyperlipidemia, unspecified: Secondary | ICD-10-CM | POA: Diagnosis not present

## 2022-09-18 DIAGNOSIS — I10 Essential (primary) hypertension: Secondary | ICD-10-CM | POA: Diagnosis not present

## 2022-09-18 DIAGNOSIS — D509 Iron deficiency anemia, unspecified: Secondary | ICD-10-CM | POA: Diagnosis not present

## 2022-09-18 DIAGNOSIS — K5901 Slow transit constipation: Secondary | ICD-10-CM | POA: Diagnosis not present

## 2022-09-18 DIAGNOSIS — E1151 Type 2 diabetes mellitus with diabetic peripheral angiopathy without gangrene: Secondary | ICD-10-CM | POA: Diagnosis not present

## 2022-09-18 DIAGNOSIS — I69391 Dysphagia following cerebral infarction: Secondary | ICD-10-CM | POA: Diagnosis not present

## 2022-09-18 DIAGNOSIS — E1142 Type 2 diabetes mellitus with diabetic polyneuropathy: Secondary | ICD-10-CM | POA: Diagnosis not present

## 2022-09-18 DIAGNOSIS — Z791 Long term (current) use of non-steroidal anti-inflammatories (NSAID): Secondary | ICD-10-CM | POA: Diagnosis not present

## 2022-09-18 DIAGNOSIS — Z794 Long term (current) use of insulin: Secondary | ICD-10-CM | POA: Diagnosis not present

## 2022-09-18 DIAGNOSIS — K148 Other diseases of tongue: Secondary | ICD-10-CM | POA: Diagnosis not present

## 2022-09-18 DIAGNOSIS — J302 Other seasonal allergic rhinitis: Secondary | ICD-10-CM | POA: Diagnosis not present

## 2022-09-18 DIAGNOSIS — K922 Gastrointestinal hemorrhage, unspecified: Secondary | ICD-10-CM | POA: Diagnosis not present

## 2022-09-18 DIAGNOSIS — F3341 Major depressive disorder, recurrent, in partial remission: Secondary | ICD-10-CM | POA: Diagnosis not present

## 2022-09-18 DIAGNOSIS — Z7902 Long term (current) use of antithrombotics/antiplatelets: Secondary | ICD-10-CM | POA: Diagnosis not present

## 2022-09-18 DIAGNOSIS — F419 Anxiety disorder, unspecified: Secondary | ICD-10-CM | POA: Diagnosis not present

## 2022-09-18 DIAGNOSIS — M1A9XX0 Chronic gout, unspecified, without tophus (tophi): Secondary | ICD-10-CM | POA: Diagnosis not present

## 2022-09-18 DIAGNOSIS — I2510 Atherosclerotic heart disease of native coronary artery without angina pectoris: Secondary | ICD-10-CM | POA: Diagnosis not present

## 2022-09-18 DIAGNOSIS — K219 Gastro-esophageal reflux disease without esophagitis: Secondary | ICD-10-CM | POA: Diagnosis not present

## 2022-09-18 DIAGNOSIS — K635 Polyp of colon: Secondary | ICD-10-CM | POA: Diagnosis not present

## 2022-09-18 DIAGNOSIS — M159 Polyosteoarthritis, unspecified: Secondary | ICD-10-CM | POA: Diagnosis not present

## 2022-09-18 DIAGNOSIS — M519 Unspecified thoracic, thoracolumbar and lumbosacral intervertebral disc disorder: Secondary | ICD-10-CM | POA: Diagnosis not present

## 2022-09-18 DIAGNOSIS — D291 Benign neoplasm of prostate: Secondary | ICD-10-CM | POA: Diagnosis not present

## 2022-09-18 DIAGNOSIS — I69393 Ataxia following cerebral infarction: Secondary | ICD-10-CM | POA: Diagnosis not present

## 2022-09-18 DIAGNOSIS — Z7984 Long term (current) use of oral hypoglycemic drugs: Secondary | ICD-10-CM | POA: Diagnosis not present

## 2022-09-18 DIAGNOSIS — I69354 Hemiplegia and hemiparesis following cerebral infarction affecting left non-dominant side: Secondary | ICD-10-CM | POA: Diagnosis not present

## 2022-09-22 DIAGNOSIS — I10 Essential (primary) hypertension: Secondary | ICD-10-CM | POA: Diagnosis not present

## 2022-09-22 DIAGNOSIS — K148 Other diseases of tongue: Secondary | ICD-10-CM | POA: Diagnosis not present

## 2022-09-22 DIAGNOSIS — I69393 Ataxia following cerebral infarction: Secondary | ICD-10-CM | POA: Diagnosis not present

## 2022-09-22 DIAGNOSIS — Z794 Long term (current) use of insulin: Secondary | ICD-10-CM | POA: Diagnosis not present

## 2022-09-22 DIAGNOSIS — E1151 Type 2 diabetes mellitus with diabetic peripheral angiopathy without gangrene: Secondary | ICD-10-CM | POA: Diagnosis not present

## 2022-09-22 DIAGNOSIS — D509 Iron deficiency anemia, unspecified: Secondary | ICD-10-CM | POA: Diagnosis not present

## 2022-09-22 DIAGNOSIS — K5901 Slow transit constipation: Secondary | ICD-10-CM | POA: Diagnosis not present

## 2022-09-22 DIAGNOSIS — K219 Gastro-esophageal reflux disease without esophagitis: Secondary | ICD-10-CM | POA: Diagnosis not present

## 2022-09-22 DIAGNOSIS — J302 Other seasonal allergic rhinitis: Secondary | ICD-10-CM | POA: Diagnosis not present

## 2022-09-22 DIAGNOSIS — D291 Benign neoplasm of prostate: Secondary | ICD-10-CM | POA: Diagnosis not present

## 2022-09-22 DIAGNOSIS — K922 Gastrointestinal hemorrhage, unspecified: Secondary | ICD-10-CM | POA: Diagnosis not present

## 2022-09-22 DIAGNOSIS — M1A9XX0 Chronic gout, unspecified, without tophus (tophi): Secondary | ICD-10-CM | POA: Diagnosis not present

## 2022-09-22 DIAGNOSIS — K635 Polyp of colon: Secondary | ICD-10-CM | POA: Diagnosis not present

## 2022-09-22 DIAGNOSIS — I2510 Atherosclerotic heart disease of native coronary artery without angina pectoris: Secondary | ICD-10-CM | POA: Diagnosis not present

## 2022-09-22 DIAGNOSIS — Z7984 Long term (current) use of oral hypoglycemic drugs: Secondary | ICD-10-CM | POA: Diagnosis not present

## 2022-09-22 DIAGNOSIS — F3341 Major depressive disorder, recurrent, in partial remission: Secondary | ICD-10-CM | POA: Diagnosis not present

## 2022-09-22 DIAGNOSIS — M159 Polyosteoarthritis, unspecified: Secondary | ICD-10-CM | POA: Diagnosis not present

## 2022-09-22 DIAGNOSIS — I69391 Dysphagia following cerebral infarction: Secondary | ICD-10-CM | POA: Diagnosis not present

## 2022-09-22 DIAGNOSIS — M519 Unspecified thoracic, thoracolumbar and lumbosacral intervertebral disc disorder: Secondary | ICD-10-CM | POA: Diagnosis not present

## 2022-09-22 DIAGNOSIS — F419 Anxiety disorder, unspecified: Secondary | ICD-10-CM | POA: Diagnosis not present

## 2022-09-22 DIAGNOSIS — Z7902 Long term (current) use of antithrombotics/antiplatelets: Secondary | ICD-10-CM | POA: Diagnosis not present

## 2022-09-22 DIAGNOSIS — E785 Hyperlipidemia, unspecified: Secondary | ICD-10-CM | POA: Diagnosis not present

## 2022-09-22 DIAGNOSIS — Z791 Long term (current) use of non-steroidal anti-inflammatories (NSAID): Secondary | ICD-10-CM | POA: Diagnosis not present

## 2022-09-22 DIAGNOSIS — E1142 Type 2 diabetes mellitus with diabetic polyneuropathy: Secondary | ICD-10-CM | POA: Diagnosis not present

## 2022-09-22 DIAGNOSIS — I69354 Hemiplegia and hemiparesis following cerebral infarction affecting left non-dominant side: Secondary | ICD-10-CM | POA: Diagnosis not present

## 2022-09-24 DIAGNOSIS — D509 Iron deficiency anemia, unspecified: Secondary | ICD-10-CM | POA: Diagnosis not present

## 2022-09-24 DIAGNOSIS — D291 Benign neoplasm of prostate: Secondary | ICD-10-CM | POA: Diagnosis not present

## 2022-09-24 DIAGNOSIS — Z794 Long term (current) use of insulin: Secondary | ICD-10-CM | POA: Diagnosis not present

## 2022-09-24 DIAGNOSIS — K148 Other diseases of tongue: Secondary | ICD-10-CM | POA: Diagnosis not present

## 2022-09-24 DIAGNOSIS — K5901 Slow transit constipation: Secondary | ICD-10-CM | POA: Diagnosis not present

## 2022-09-24 DIAGNOSIS — I69354 Hemiplegia and hemiparesis following cerebral infarction affecting left non-dominant side: Secondary | ICD-10-CM | POA: Diagnosis not present

## 2022-09-24 DIAGNOSIS — J302 Other seasonal allergic rhinitis: Secondary | ICD-10-CM | POA: Diagnosis not present

## 2022-09-24 DIAGNOSIS — M519 Unspecified thoracic, thoracolumbar and lumbosacral intervertebral disc disorder: Secondary | ICD-10-CM | POA: Diagnosis not present

## 2022-09-24 DIAGNOSIS — I69391 Dysphagia following cerebral infarction: Secondary | ICD-10-CM | POA: Diagnosis not present

## 2022-09-24 DIAGNOSIS — Z791 Long term (current) use of non-steroidal anti-inflammatories (NSAID): Secondary | ICD-10-CM | POA: Diagnosis not present

## 2022-09-24 DIAGNOSIS — Z7984 Long term (current) use of oral hypoglycemic drugs: Secondary | ICD-10-CM | POA: Diagnosis not present

## 2022-09-24 DIAGNOSIS — Z7902 Long term (current) use of antithrombotics/antiplatelets: Secondary | ICD-10-CM | POA: Diagnosis not present

## 2022-09-24 DIAGNOSIS — K922 Gastrointestinal hemorrhage, unspecified: Secondary | ICD-10-CM | POA: Diagnosis not present

## 2022-09-24 DIAGNOSIS — E785 Hyperlipidemia, unspecified: Secondary | ICD-10-CM | POA: Diagnosis not present

## 2022-09-24 DIAGNOSIS — I69393 Ataxia following cerebral infarction: Secondary | ICD-10-CM | POA: Diagnosis not present

## 2022-09-24 DIAGNOSIS — F419 Anxiety disorder, unspecified: Secondary | ICD-10-CM | POA: Diagnosis not present

## 2022-09-24 DIAGNOSIS — K219 Gastro-esophageal reflux disease without esophagitis: Secondary | ICD-10-CM | POA: Diagnosis not present

## 2022-09-24 DIAGNOSIS — I2510 Atherosclerotic heart disease of native coronary artery without angina pectoris: Secondary | ICD-10-CM | POA: Diagnosis not present

## 2022-09-24 DIAGNOSIS — K635 Polyp of colon: Secondary | ICD-10-CM | POA: Diagnosis not present

## 2022-09-24 DIAGNOSIS — E1142 Type 2 diabetes mellitus with diabetic polyneuropathy: Secondary | ICD-10-CM | POA: Diagnosis not present

## 2022-09-24 DIAGNOSIS — E1151 Type 2 diabetes mellitus with diabetic peripheral angiopathy without gangrene: Secondary | ICD-10-CM | POA: Diagnosis not present

## 2022-09-24 DIAGNOSIS — M159 Polyosteoarthritis, unspecified: Secondary | ICD-10-CM | POA: Diagnosis not present

## 2022-09-24 DIAGNOSIS — F3341 Major depressive disorder, recurrent, in partial remission: Secondary | ICD-10-CM | POA: Diagnosis not present

## 2022-09-24 DIAGNOSIS — M1A9XX0 Chronic gout, unspecified, without tophus (tophi): Secondary | ICD-10-CM | POA: Diagnosis not present

## 2022-09-24 DIAGNOSIS — I10 Essential (primary) hypertension: Secondary | ICD-10-CM | POA: Diagnosis not present

## 2022-09-25 ENCOUNTER — Encounter: Payer: Self-pay | Admitting: Gastroenterology

## 2022-09-25 ENCOUNTER — Ambulatory Visit (INDEPENDENT_AMBULATORY_CARE_PROVIDER_SITE_OTHER): Payer: Medicare HMO | Admitting: Gastroenterology

## 2022-09-25 ENCOUNTER — Other Ambulatory Visit (INDEPENDENT_AMBULATORY_CARE_PROVIDER_SITE_OTHER): Payer: Medicare HMO

## 2022-09-25 VITALS — BP 120/80 | HR 92 | Ht 71.0 in | Wt 276.5 lb

## 2022-09-25 DIAGNOSIS — D509 Iron deficiency anemia, unspecified: Secondary | ICD-10-CM

## 2022-09-25 DIAGNOSIS — K5900 Constipation, unspecified: Secondary | ICD-10-CM | POA: Diagnosis not present

## 2022-09-25 DIAGNOSIS — K219 Gastro-esophageal reflux disease without esophagitis: Secondary | ICD-10-CM | POA: Diagnosis not present

## 2022-09-25 LAB — CBC WITH DIFFERENTIAL/PLATELET
Basophils Absolute: 0.1 10*3/uL (ref 0.0–0.1)
Basophils Relative: 0.7 % (ref 0.0–3.0)
Eosinophils Absolute: 0.7 10*3/uL (ref 0.0–0.7)
Eosinophils Relative: 7.5 % — ABNORMAL HIGH (ref 0.0–5.0)
HCT: 35.8 % — ABNORMAL LOW (ref 39.0–52.0)
Hemoglobin: 11.6 g/dL — ABNORMAL LOW (ref 13.0–17.0)
Lymphocytes Relative: 27.7 % (ref 12.0–46.0)
Lymphs Abs: 2.4 10*3/uL (ref 0.7–4.0)
MCHC: 32.4 g/dL (ref 30.0–36.0)
MCV: 86 fl (ref 78.0–100.0)
Monocytes Absolute: 0.7 10*3/uL (ref 0.1–1.0)
Monocytes Relative: 8.6 % (ref 3.0–12.0)
Neutro Abs: 4.8 10*3/uL (ref 1.4–7.7)
Neutrophils Relative %: 55.5 % (ref 43.0–77.0)
Platelets: 305 10*3/uL (ref 150.0–400.0)
RBC: 4.16 Mil/uL — ABNORMAL LOW (ref 4.22–5.81)
RDW: 15.5 % (ref 11.5–15.5)
WBC: 8.7 10*3/uL (ref 4.0–10.5)

## 2022-09-25 LAB — COMPREHENSIVE METABOLIC PANEL
ALT: 37 U/L (ref 0–53)
AST: 48 U/L — ABNORMAL HIGH (ref 0–37)
Albumin: 4.3 g/dL (ref 3.5–5.2)
Alkaline Phosphatase: 67 U/L (ref 39–117)
BUN: 24 mg/dL — ABNORMAL HIGH (ref 6–23)
CO2: 29 meq/L (ref 19–32)
Calcium: 9.8 mg/dL (ref 8.4–10.5)
Chloride: 97 meq/L (ref 96–112)
Creatinine, Ser: 1.03 mg/dL (ref 0.40–1.50)
GFR: 72.08 mL/min (ref >60.00)
Glucose, Bld: 130 mg/dL — ABNORMAL HIGH (ref 70–99)
Potassium: 3.8 meq/L (ref 3.5–5.1)
Sodium: 136 meq/L (ref 135–145)
Total Bilirubin: 0.3 mg/dL (ref 0.2–1.2)
Total Protein: 8 g/dL (ref 6.0–8.3)

## 2022-09-25 NOTE — Patient Instructions (Signed)
_______________________________________________________  If your blood pressure at your visit was 140/90 or greater, please contact your primary care physician to follow up on this.  _______________________________________________________  If you are age 73 or older, your body mass index should be between 23-30. Your Body mass index is 38.56 kg/m. If this is out of the aforementioned range listed, please consider follow up with your Primary Care Provider.  If you are age 49 or younger, your body mass index should be between 19-25. Your Body mass index is 38.56 kg/m. If this is out of the aformentioned range listed, please consider follow up with your Primary Care Provider.   ________________________________________________________  The [facility] providers would like to encourage you to use [facility] to communicate with providers for non-urgent requests or questions.  Due to long hold times on the telephone, sending your provider a message by [facility] may be a faster and more efficient way to get a response.  Please allow 48 business hours for a response.  Please remember that this is for non-urgent requests.  _______________________________________________________  Your provider has requested that you go to the basement level for lab work before leaving today. Press "B" on the elevator. The lab is located at the first door on the left as you exit the elevator.  Please call Dr Dianne Dun office regarding plavix   Continue omeprazole 40mg  daily  Avoid NSAID's including mobic  Please purchase the following medications over the counter and take as directed: Colace 1 tablet 2 times a day  You have been scheduled for an appointment with Dr. Chales Abrahams  on 12-26-2022 at 10:40am . Please arrive 10 minutes early for your appointment.  You have been scheduled for an abdominal ultrasound at [facility] (1st floor of hospital) on 10-01-2022 at 930am. Please arrive 30 minutes prior to your appointment  for registration. Make certain not to have anything to eat or drink midnight prior to your appointment. Should you need to reschedule your appointment, please contact radiology at [phone]. This test typically takes about 30 minutes to perform.  Thank you,  Dr. Lynann Bologna

## 2022-09-25 NOTE — Progress Notes (Signed)
Chief Complaint: IDA  Referring Provider:  Paulina Fusi, MD      ASSESSMENT AND PLAN;   #1. IDA  #2. GERD  #3.  Constipation  #4. H/O fatty liver  #5. 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 -FU in 12 weeks. If still with problems, would consider rpt EGD/colon at [facility] (may need APC) after neurology clearance.   HPI:    [name] Williams is a 72 y.o. male  With multiple medical problems including CAD (Nl EF 50 to 55% 08/2020), anxiety/depression, hypertension, hyperlipidemia, DM II, gout, GERD, obesity, colon polyps and IDA  Very poor historian  S/P very recent CVA with L leg weakness 08/2022- on plavix now.  Had recurrent IDA-I could not find any recent CBC  NO rectal bleeding  No nausea, vomiting, heartburn, regurgitation, odynophagia or dysphagia.  No significant diarrhea.  Continues to have constipation with hard Bms-1 every other day.  No melena or hematochezia. No unintentional weight loss. No abdominal pain.  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  - A single non-bleeding angiodysplastic lesion in the stomach.  - Multiple duodenal ulcers  Colonoscopy 07/2019 -Fair prep -One 4 mm polyp in the distal sigmoid colon, removed with a cold biopsy forceps. Resected and retrieved. - Minimal sigmoid diverticulosis. - 2 small AVMs. 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) 10/13/2019   DDD (degenerative disc disease), cervical    Depression    Dyslipidemia 05/29/2017   Essential hypertension 05/29/2017   Generalized osteoarthritis of multiple sites    Gout    Hyperlipidemia    Hypertension, benign    Laryngopharyngeal reflux    Morbid obesity (HCC) 11/26/2018   Shortness of breath 07/17/2017   Sinus arrhythmia 11/26/2018   Stroke (HCC) 08/2022   [facility] hospitalized   Swelling 05/29/2017   Tobacco abuse    Type 2 diabetes mellitus without complication, without long-term current use of insulin (HCC) 05/29/2017   Uncontrolled type 2 diabetes mellitus with peripheral neuropathy     Past Surgical History:  Procedure Laterality Date   HERNIA REPAIR  1998   Abdominal   LEFT HEART CATH AND CORONARY ANGIOGRAPHY N/A 10/13/2019   Procedure: LEFT HEART CATH AND CORONARY ANGIOGRAPHY;  Surgeon: Yvonne Kendall, MD;  Location: [facility];  Service: Cardiovascular;  Laterality: N/A;   MINOR HEMORRHOIDECTOMY  1998   NASAL SINUS SURGERY  1978   SHOULDER SURGERY Left    X2   TONSILLECTOMY  1967    Family History  Problem Relation Age of Onset   Stroke Father    Lung cancer Father    Alcohol abuse Father    Diabetes Brother    Stomach cancer Maternal Grandmother    Stomach cancer Paternal Grandmother    Colon  cancer Neg Hx    Esophageal cancer Neg Hx    Rectal cancer Neg Hx     Social History   Tobacco Use   Smoking status: Former   Smokeless tobacco: Former    Types: Associate Professor status: Never Used  Substance Use Topics   Alcohol use: Not Currently   Drug use: Never    Current Outpatient Medications  Medication Sig Dispense Refill   allopurinol (ZYLOPRIM) 300 MG tablet Take 300 mg by mouth daily.     amLODipine (NORVASC) 5 MG tablet Take 5 mg by mouth daily.      aspirin EC 81 MG tablet Take 81 mg by mouth  daily. Swallow whole.     atorvastatin (LIPITOR) 80 MG tablet Take 80 mg by mouth at bedtime.      cetirizine (ZYRTEC) 10 MG tablet Take 10 mg by mouth daily.      citalopram (CELEXA) 20 MG tablet Take 20 mg by mouth daily.     clopidogrel (PLAVIX) 75 MG tablet Take 75 mg by mouth daily.     docusate sodium (COLACE) 100 MG capsule Take 100 mg by mouth daily.     doxycycline (VIBRA-TABS) 100 MG tablet Take 100 mg by mouth 2 (two) times daily.     FEROSUL 325 (65 Fe) MG tablet TAKE 1 TABLET EVERY OTHER DAY 45 tablet 2   finasteride (PROSCAR) 5 MG tablet Take 5 mg by mouth daily.     furosemide (LASIX) 20 MG tablet Take 20 mg by mouth daily.     gabapentin (NEURONTIN) 300 MG capsule Take 300 mg by mouth 3 (three) times daily.     gemfibrozil (LOPID) 600 MG tablet Take 600 mg by mouth 2 (two) times daily before a meal.     glimepiride (AMARYL) 4 MG tablet Take 4 mg by mouth 2 (two) times daily.     isosorbide mononitrate (IMDUR) 30 MG 24 hr tablet Take 30 mg by mouth daily.     losartan (COZAAR) 100 MG tablet Take 100 mg by mouth at bedtime.      meloxicam (MOBIC) 7.5 MG tablet Take 7.5 mg by mouth 2 (two) times daily.     metFORMIN (GLUCOPHAGE) 1000 MG tablet Take 1 tablet (1,000 mg total) by mouth 2 (two) times daily with a meal.     metoprolol succinate (TOPROL-XL) 25 MG 24 hr tablet Take 12.5 mg by mouth daily.     montelukast (SINGULAIR) 10 MG tablet Take 10 mg by mouth daily.      naphazoline-pheniramine (NAPHCON-A) 0.025-0.3 % ophthalmic solution Place 1 drop into both eyes 3 (three) times daily as needed for eye irritation (dry/irritated/allergy eyes.).      nitroGLYCERIN (NITROSTAT) 0.4 MG SL tablet Place 0.4 mg under the tongue every 5 (five) minutes as needed for chest pain.     NOVOLOG FLEXPEN 100 UNIT/ML FlexPen Inject 20 Units into the skin in the morning, at noon, and at bedtime.      Omega-3 Fatty Acids (OMEGA 3 PO) Take 2 capsules by mouth daily. Omega XL     omeprazole (PRILOSEC)  40 MG capsule Take 40 mg by mouth at bedtime.     potassium chloride SA (KLOR-CON M) 20 MEQ tablet Take 20 mEq by mouth daily.     tamsulosin (FLOMAX) 0.4 MG CAPS capsule Take 0.4 mg by mouth daily.     traMADol-acetaminophen (ULTRACET) 37.5-325 MG tablet Take 1 tablet by mouth as needed for moderate  pain or severe pain.     TRESIBA FLEXTOUCH 100 UNIT/ML FlexTouch Pen Inject 60 Units into the skin daily.      pantoprazole (PROTONIX) 40 MG tablet Take 40 mg by mouth daily. (Patient not taking: Reported on 09/25/2022)     No current facility-administered medications for this visit.    Allergies  Allergen Reactions   Pseudoephedrine Other (See Comments)   Penicillins Rash   Tetracyclines & Related Rash    Review of Systems:  neg     Physical Exam:    BP 120/80   Pulse 92   Ht 5\' 11"  (1.803 m)   Wt 276 lb 8 oz (125.4 kg)   BMI 38.56 kg/m  Wt Readings from Last 3 Encounters:  09/25/22 276 lb 8 oz (125.4 kg)  05/10/21 283 lb (128.4 kg)  07/26/20 275 lb 3.2 oz (124.8 kg)   Constitutional:  Well-developed, in no acute distress. Psychiatric: Normal mood and affect. Behavior is normal. HEENT: Pupils normal.  Conjunctivae are normal. No scleral icterus. Cardiovascular: Normal rate, regular rhythm. No edema Pulmonary/chest: Effort normal and breath sounds normal. No wheezing, rales or rhonchi. Abdominal: Soft, nondistended. Nontender. Bowel sounds active throughout. There are no masses palpable. No hepatomegaly. Rectal: Deferred Neurological: Alert and oriented to person place and time. Skin: Skin is warm and dry. No rashes noted.  Data Reviewed: I have personally reviewed following labs and imaging studies  CBC:    Latest Ref Rng & Units 07/26/2020    2:28 PM 01/05/2020   11:20 AM 10/06/2019    2:05 PM  CBC  WBC 3.4 - 10.8 x10E3/uL 9.5  8.0  8.2   Hemoglobin 13.0 - 17.7 g/dL 40.9  81.1  91.4   Hematocrit 37.5 - 51.0 % 33.9  33.8  35.3   Platelets 150 - 450 x10E3/uL 362  298   335     CMP:    Latest Ref Rng & Units 07/26/2020    2:28 PM 01/05/2020   11:20 AM 10/06/2019    2:05 PM  CMP  Glucose 65 - 99 mg/dL 782  956  213   BUN 8 - 27 mg/dL 18  20  14    Creatinine 0.76 - 1.27 mg/dL 0.86  5.78  4.69   Sodium 134 - 144 mmol/L 139  137  139   Potassium 3.5 - 5.2 mmol/L 4.7  4.2  4.5   Chloride 96 - 106 mmol/L 100  98  98   CO2 20 - 29 mmol/L 22  24  24    Calcium 8.6 - 10.2 mg/dL 9.9  62.9  52.8       Edman Circle, MD 09/25/2022, 11:21 AM  Cc: Paulina Fusi, MD

## 2022-09-26 ENCOUNTER — Telehealth: Payer: Self-pay

## 2022-09-26 DIAGNOSIS — D509 Iron deficiency anemia, unspecified: Secondary | ICD-10-CM

## 2022-09-26 LAB — IBC + FERRITIN
Ferritin: 16.7 ng/mL — ABNORMAL LOW (ref 22.0–322.0)
Iron: 48 ug/dL (ref 42–165)
Saturation Ratios: 8.1 % — ABNORMAL LOW (ref 20.0–50.0)
TIBC: 595 ug/dL — ABNORMAL HIGH (ref 250.0–450.0)
Transferrin: 425 mg/dL — ABNORMAL HIGH (ref 212.0–360.0)

## 2022-09-26 NOTE — Telephone Encounter (Signed)
Called both number and they didn't work. Will call the son on Monday regarding this

## 2022-09-26 NOTE — Telephone Encounter (Signed)
-----   Message from Lynann Bologna sent at 09/26/2022  4:36 PM EDT ----- Please inform the patient. Hemoglobin better at 11.6 but low iron indices suggestive of IDA Should see hematology for possible IV iron since he is now on Plavix And needs to follow-up as per last note.  Then likely would need EGD/colonoscopy at [facility] Send report to family physician

## 2022-09-28 NOTE — Progress Notes (Deleted)
could be considered.  He should be challenged with dual antiplatelet therapy before proceeding with PCI to ensure that he does not experience recurrent GI bleeding. 2. Continue aggressive secondary prevention of coronary artery disease.  Yvonne Kendall, MD [facility]  Findings Coronary  Findings Diagnostic  Dominance: Right  Left Main Vessel is large. Vessel is angiographically normal.  Left Anterior Descending Vessel is large. Prox LAD to Mid LAD lesion is 50% stenosed. Mid LAD lesion is 50% stenosed.  First Diagonal Branch Vessel is small in size.  Second Diagonal Branch Vessel is small in size.  Third Diagonal Branch Vessel is moderate in size.  Left Circumflex Vessel is large. Prox Cx lesion is 20% stenosed.  First Obtuse Marginal Branch Vessel is small in size.  Second Obtuse Marginal Branch Vessel is moderate in size.  Third Obtuse Marginal Branch Vessel is large in size.  Fourth Obtuse Marginal Branch Vessel is moderate in size.  Right Coronary Artery Vessel is moderate in size. Prox RCA-1 lesion is 60% stenosed. Prox RCA-2 lesion is 70% stenosed.  Right Posterior Descending Artery Vessel is small in size.  First Right Posterolateral Branch Vessel is moderate in size. 1st RPL lesion is 70% stenosed.  Intervention  No interventions have been documented.   STRESS TESTS  MYOCARDIAL PERFUSION IMAGING 12/16/2018  Narrative  The left ventricular ejection fraction is normal (55-65%).  Nuclear stress EF: 56%.  There was no ST segment deviation noted during stress.  This is a low risk study.  No evidence of ischemia or MI  Normal EF   ECHOCARDIOGRAM  ECHOCARDIOGRAM COMPLETE 08/16/2020  Narrative ECHOCARDIOGRAM REPORT    Patient Name:   [name] Williams Date of Exam: 08/16/2020 Medical Rec #:  [id number]      Height:       71.0 in Accession #:    [id number]     Weight:       275.2 lb Date of Birth:  [birth year]       BSA:          2.415 m Patient Age:    72 years       BP:           158/72 mmHg Patient Gender: M              HR:           94 bpm. Exam Location:  [place]  Procedure: 2D Echo  Indications:    Ascending aortic aneurysm I71  History:        Patient has prior history of Echocardiogram examinations,  most recent 12/29/2018. Risk Factors:Hypertension, Diabetes, Tobacco abuse and Dyslipidemia.  Sonographer:    Louie Boston Referring Phys: [id number] KARDIE TOBB   Sonographer Comments: Suboptimal subcostal window and suboptimal apical window. IMPRESSIONS   1. Left ventricular ejection fraction, by estimation, is 50 to 55%. The left ventricle has low normal function. Left ventricular endocardial border not optimally defined to evaluate regional wall motion. There is moderate concentric left ventricular hypertrophy. Left ventricular diastolic parameters are consistent with Grade I diastolic dysfunction (impaired relaxation). 2. Right ventricular systolic function is normal. The right ventricular size is normal. 3. Left atrial size was mildly dilated by M mode. 4. The mitral valve is normal in structure. No evidence of mitral valve regurgitation. No evidence of mitral stenosis. 5. The aortic valve is tricuspid. Aortic valve regurgitation is not visualized. Mild aortic valve sclerosis is present, with no evidence of aortic valve stenosis. 6. There is moderate  Cardiology Office Note:    Date:  09/28/2022   ID:  [name] Williams, DOB [birth year], MRN [id number]  PCP:  Paulina Fusi, MD  Cardiologist:  Norman Herrlich, MD    Referring MD: Paulina Fusi, MD    ASSESSMENT:    No diagnosis found. PLAN:    In order of problems listed above:  ***   Next appointment: ***   Medication Adjustments/Labs and Tests Ordered: Current medicines are reviewed at length with the patient today.  Concerns regarding medicines are outlined above.  No orders of the defined types were placed in this encounter.  No orders of the defined types were placed in this encounter.    History of Present Illness:    [name] Williams is a 72 y.o. 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  Recent coronary angiogram showed the presence of moderate to severe two-vessel CAD with the recommendations for interventional show cardiology to continue medical treatment.  In particular he did not have any indication for CABG and had a relative contraindication for elective PCI with his history of ulcer disease bleeding and significant anemia requiring transfusion.  In this case outside of ACS medical therapy was advocated. 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) 10/13/2019   DDD (degenerative disc disease), cervical    Depression    Dyslipidemia 05/29/2017   Essential hypertension 05/29/2017   Generalized osteoarthritis of multiple sites     Gout    Hyperlipidemia    Hypertension, benign    Laryngopharyngeal reflux    Morbid obesity (HCC) 11/26/2018   Shortness of breath 07/17/2017   Sinus arrhythmia 11/26/2018   Stroke (HCC) 08/2022   [facility] hospitalized   Swelling 05/29/2017   Tobacco abuse    Type 2 diabetes mellitus without complication, without long-term current use of insulin (HCC) 05/29/2017   Uncontrolled type 2 diabetes mellitus with peripheral neuropathy     Current Medications: No outpatient medications have been marked as taking for the 09/29/22 encounter (Appointment) with Baldo Daub, MD.      EKGs/Labs/Other Studies Reviewed:    The following studies were reviewed today:  Cardiac Studies & Procedures   CARDIAC CATHETERIZATION  CARDIAC CATHETERIZATION 10/13/2019  Narrative Conclusions: 1. Moderate to severe, non-critical two vessel coronary artery disease with sequential 50% proximal/mid LAD stenoses as well as tandem 60-70% proximal RCA lesions.  There is also a 70% stenosis involving rPL1. 2. Grossly normal left ventricular systolic function with moderately elevated filling pressure (LVEDP 25-30 mmHg). 3. Tortuous right subclavian artery limiting catheter manipulation.  Consider use of a long radial sheath versus alternate access if catheterization is needed in the future.  Recommendations: 1. Given recent severe anemia and improving symptoms with rising hemoglobin, as well as moderately elevated LVEDP, I favor optimization of medical therapy.  I will increase furosemide to 40 mg BID and add isosorbide mononitrate 30 mg daily.  If Mr. Wolfinger has refractory symptoms despite optimal medical therapy, PCI to the RCA  could be considered.  He should be challenged with dual antiplatelet therapy before proceeding with PCI to ensure that he does not experience recurrent GI bleeding. 2. Continue aggressive secondary prevention of coronary artery disease.  Yvonne Kendall, MD [facility]  Findings Coronary  Findings Diagnostic  Dominance: Right  Left Main Vessel is large. Vessel is angiographically normal.  Left Anterior Descending Vessel is large. Prox LAD to Mid LAD lesion is 50% stenosed. Mid LAD lesion is 50% stenosed.  First Diagonal Branch Vessel is small in size.  Second Diagonal Branch Vessel is small in size.  Third Diagonal Branch Vessel is moderate in size.  Left Circumflex Vessel is large. Prox Cx lesion is 20% stenosed.  First Obtuse Marginal Branch Vessel is small in size.  Second Obtuse Marginal Branch Vessel is moderate in size.  Third Obtuse Marginal Branch Vessel is large in size.  Fourth Obtuse Marginal Branch Vessel is moderate in size.  Right Coronary Artery Vessel is moderate in size. Prox RCA-1 lesion is 60% stenosed. Prox RCA-2 lesion is 70% stenosed.  Right Posterior Descending Artery Vessel is small in size.  First Right Posterolateral Branch Vessel is moderate in size. 1st RPL lesion is 70% stenosed.  Intervention  No interventions have been documented.   STRESS TESTS  MYOCARDIAL PERFUSION IMAGING 12/16/2018  Narrative  The left ventricular ejection fraction is normal (55-65%).  Nuclear stress EF: 56%.  There was no ST segment deviation noted during stress.  This is a low risk study.  No evidence of ischemia or MI  Normal EF   ECHOCARDIOGRAM  ECHOCARDIOGRAM COMPLETE 08/16/2020  Narrative ECHOCARDIOGRAM REPORT    Patient Name:   [name] Williams Date of Exam: 08/16/2020 Medical Rec #:  [id number]      Height:       71.0 in Accession #:    [id number]     Weight:       275.2 lb Date of Birth:  [birth year]       BSA:          2.415 m Patient Age:    72 years       BP:           158/72 mmHg Patient Gender: M              HR:           94 bpm. Exam Location:  [place]  Procedure: 2D Echo  Indications:    Ascending aortic aneurysm I71  History:        Patient has prior history of Echocardiogram examinations,  most recent 12/29/2018. Risk Factors:Hypertension, Diabetes, Tobacco abuse and Dyslipidemia.  Sonographer:    Louie Boston Referring Phys: [id number] KARDIE TOBB   Sonographer Comments: Suboptimal subcostal window and suboptimal apical window. IMPRESSIONS   1. Left ventricular ejection fraction, by estimation, is 50 to 55%. The left ventricle has low normal function. Left ventricular endocardial border not optimally defined to evaluate regional wall motion. There is moderate concentric left ventricular hypertrophy. Left ventricular diastolic parameters are consistent with Grade I diastolic dysfunction (impaired relaxation). 2. Right ventricular systolic function is normal. The right ventricular size is normal. 3. Left atrial size was mildly dilated by M mode. 4. The mitral valve is normal in structure. No evidence of mitral valve regurgitation. No evidence of mitral stenosis. 5. The aortic valve is tricuspid. Aortic valve regurgitation is not visualized. Mild aortic valve sclerosis is present, with no evidence of aortic valve stenosis. 6. There is moderate  Cardiology Office Note:    Date:  09/28/2022   ID:  [name] Williams, DOB [birth year], MRN [id number]  PCP:  Paulina Fusi, MD  Cardiologist:  Norman Herrlich, MD    Referring MD: Paulina Fusi, MD    ASSESSMENT:    No diagnosis found. PLAN:    In order of problems listed above:  ***   Next appointment: ***   Medication Adjustments/Labs and Tests Ordered: Current medicines are reviewed at length with the patient today.  Concerns regarding medicines are outlined above.  No orders of the defined types were placed in this encounter.  No orders of the defined types were placed in this encounter.    History of Present Illness:    [name] Williams is a 72 y.o. 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  Recent coronary angiogram showed the presence of moderate to severe two-vessel CAD with the recommendations for interventional show cardiology to continue medical treatment.  In particular he did not have any indication for CABG and had a relative contraindication for elective PCI with his history of ulcer disease bleeding and significant anemia requiring transfusion.  In this case outside of ACS medical therapy was advocated. 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) 10/13/2019   DDD (degenerative disc disease), cervical    Depression    Dyslipidemia 05/29/2017   Essential hypertension 05/29/2017   Generalized osteoarthritis of multiple sites     Gout    Hyperlipidemia    Hypertension, benign    Laryngopharyngeal reflux    Morbid obesity (HCC) 11/26/2018   Shortness of breath 07/17/2017   Sinus arrhythmia 11/26/2018   Stroke (HCC) 08/2022   [facility] hospitalized   Swelling 05/29/2017   Tobacco abuse    Type 2 diabetes mellitus without complication, without long-term current use of insulin (HCC) 05/29/2017   Uncontrolled type 2 diabetes mellitus with peripheral neuropathy     Current Medications: No outpatient medications have been marked as taking for the 09/29/22 encounter (Appointment) with Baldo Daub, MD.      EKGs/Labs/Other Studies Reviewed:    The following studies were reviewed today:  Cardiac Studies & Procedures   CARDIAC CATHETERIZATION  CARDIAC CATHETERIZATION 10/13/2019  Narrative Conclusions: 1. Moderate to severe, non-critical two vessel coronary artery disease with sequential 50% proximal/mid LAD stenoses as well as tandem 60-70% proximal RCA lesions.  There is also a 70% stenosis involving rPL1. 2. Grossly normal left ventricular systolic function with moderately elevated filling pressure (LVEDP 25-30 mmHg). 3. Tortuous right subclavian artery limiting catheter manipulation.  Consider use of a long radial sheath versus alternate access if catheterization is needed in the future.  Recommendations: 1. Given recent severe anemia and improving symptoms with rising hemoglobin, as well as moderately elevated LVEDP, I favor optimization of medical therapy.  I will increase furosemide to 40 mg BID and add isosorbide mononitrate 30 mg daily.  If Mr. Wolfinger has refractory symptoms despite optimal medical therapy, PCI to the RCA  Cardiology Office Note:    Date:  09/28/2022   ID:  [name] Williams, DOB [birth year], MRN [id number]  PCP:  Paulina Fusi, MD  Cardiologist:  Norman Herrlich, MD    Referring MD: Paulina Fusi, MD    ASSESSMENT:    No diagnosis found. PLAN:    In order of problems listed above:  ***   Next appointment: ***   Medication Adjustments/Labs and Tests Ordered: Current medicines are reviewed at length with the patient today.  Concerns regarding medicines are outlined above.  No orders of the defined types were placed in this encounter.  No orders of the defined types were placed in this encounter.    History of Present Illness:    [name] Williams is a 72 y.o. 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  Recent coronary angiogram showed the presence of moderate to severe two-vessel CAD with the recommendations for interventional show cardiology to continue medical treatment.  In particular he did not have any indication for CABG and had a relative contraindication for elective PCI with his history of ulcer disease bleeding and significant anemia requiring transfusion.  In this case outside of ACS medical therapy was advocated. 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) 10/13/2019   DDD (degenerative disc disease), cervical    Depression    Dyslipidemia 05/29/2017   Essential hypertension 05/29/2017   Generalized osteoarthritis of multiple sites     Gout    Hyperlipidemia    Hypertension, benign    Laryngopharyngeal reflux    Morbid obesity (HCC) 11/26/2018   Shortness of breath 07/17/2017   Sinus arrhythmia 11/26/2018   Stroke (HCC) 08/2022   [facility] hospitalized   Swelling 05/29/2017   Tobacco abuse    Type 2 diabetes mellitus without complication, without long-term current use of insulin (HCC) 05/29/2017   Uncontrolled type 2 diabetes mellitus with peripheral neuropathy     Current Medications: No outpatient medications have been marked as taking for the 09/29/22 encounter (Appointment) with Baldo Daub, MD.      EKGs/Labs/Other Studies Reviewed:    The following studies were reviewed today:  Cardiac Studies & Procedures   CARDIAC CATHETERIZATION  CARDIAC CATHETERIZATION 10/13/2019  Narrative Conclusions: 1. Moderate to severe, non-critical two vessel coronary artery disease with sequential 50% proximal/mid LAD stenoses as well as tandem 60-70% proximal RCA lesions.  There is also a 70% stenosis involving rPL1. 2. Grossly normal left ventricular systolic function with moderately elevated filling pressure (LVEDP 25-30 mmHg). 3. Tortuous right subclavian artery limiting catheter manipulation.  Consider use of a long radial sheath versus alternate access if catheterization is needed in the future.  Recommendations: 1. Given recent severe anemia and improving symptoms with rising hemoglobin, as well as moderately elevated LVEDP, I favor optimization of medical therapy.  I will increase furosemide to 40 mg BID and add isosorbide mononitrate 30 mg daily.  If Mr. Wolfinger has refractory symptoms despite optimal medical therapy, PCI to the RCA

## 2022-09-29 ENCOUNTER — Ambulatory Visit: Payer: Medicare HMO | Admitting: Cardiology

## 2022-09-29 NOTE — Telephone Encounter (Signed)
Letter sent as well.

## 2022-09-29 NOTE — Telephone Encounter (Signed)
Called son and left a message

## 2022-09-30 DIAGNOSIS — I10 Essential (primary) hypertension: Secondary | ICD-10-CM | POA: Diagnosis not present

## 2022-09-30 DIAGNOSIS — E785 Hyperlipidemia, unspecified: Secondary | ICD-10-CM | POA: Diagnosis not present

## 2022-09-30 DIAGNOSIS — R600 Localized edema: Secondary | ICD-10-CM | POA: Diagnosis not present

## 2022-09-30 DIAGNOSIS — N401 Enlarged prostate with lower urinary tract symptoms: Secondary | ICD-10-CM | POA: Diagnosis not present

## 2022-09-30 DIAGNOSIS — D509 Iron deficiency anemia, unspecified: Secondary | ICD-10-CM | POA: Diagnosis not present

## 2022-09-30 DIAGNOSIS — E1142 Type 2 diabetes mellitus with diabetic polyneuropathy: Secondary | ICD-10-CM | POA: Diagnosis not present

## 2022-09-30 DIAGNOSIS — R350 Frequency of micturition: Secondary | ICD-10-CM | POA: Diagnosis not present

## 2022-09-30 DIAGNOSIS — I69354 Hemiplegia and hemiparesis following cerebral infarction affecting left non-dominant side: Secondary | ICD-10-CM | POA: Diagnosis not present

## 2022-09-30 DIAGNOSIS — I25118 Atherosclerotic heart disease of native coronary artery with other forms of angina pectoris: Secondary | ICD-10-CM | POA: Diagnosis not present

## 2022-09-30 NOTE — Telephone Encounter (Signed)
Patient made aware. Wrong number in the chart and right number was placed in chart for the mobile

## 2022-09-30 NOTE — Telephone Encounter (Signed)
Referral made for Cancer center [facility]

## 2022-09-30 NOTE — Addendum Note (Signed)
Addended by: Lurline Hare on: 09/30/2022 03:53 PM   Modules accepted: Orders

## 2022-10-01 ENCOUNTER — Ambulatory Visit (HOSPITAL_COMMUNITY)
Admission: RE | Admit: 2022-10-01 | Discharge: 2022-10-01 | Disposition: A | Discharge status: Home / Self Care | Payer: Medicare HMO | Source: Ambulatory Visit | Attending: Gastroenterology | Admitting: Gastroenterology

## 2022-10-01 ENCOUNTER — Telehealth: Payer: Self-pay | Admitting: Gastroenterology

## 2022-10-01 DIAGNOSIS — K219 Gastro-esophageal reflux disease without esophagitis: Secondary | ICD-10-CM | POA: Diagnosis not present

## 2022-10-01 DIAGNOSIS — D509 Iron deficiency anemia, unspecified: Secondary | ICD-10-CM | POA: Insufficient documentation

## 2022-10-01 DIAGNOSIS — K5900 Constipation, unspecified: Secondary | ICD-10-CM | POA: Diagnosis not present

## 2022-10-01 DIAGNOSIS — Z944 Liver transplant status: Secondary | ICD-10-CM | POA: Diagnosis not present

## 2022-10-01 DIAGNOSIS — K760 Fatty (change of) liver, not elsewhere classified: Secondary | ICD-10-CM | POA: Diagnosis not present

## 2022-10-01 DIAGNOSIS — N281 Cyst of kidney, acquired: Secondary | ICD-10-CM | POA: Diagnosis not present

## 2022-10-01 LAB — LAB REPORT - SCANNED
A1c: 9.2
Albumin, Urine POC: 3547
Creatinine, POC: 86.8 mg/dL
EGFR: 79
Microalb Creat Ratio: 4086

## 2022-10-01 NOTE — Telephone Encounter (Signed)
Labs placed in Dr's box

## 2022-10-01 NOTE — Telephone Encounter (Signed)
Inbound call from patients son stating that patient had blood work done with his PCP Dr. Tomasa Blase and they are going to fax the results over.

## 2022-10-02 DIAGNOSIS — Z87891 Personal history of nicotine dependence: Secondary | ICD-10-CM | POA: Diagnosis not present

## 2022-10-02 DIAGNOSIS — K1321 Leukoplakia of oral mucosa, including tongue: Secondary | ICD-10-CM | POA: Diagnosis not present

## 2022-10-03 DIAGNOSIS — K148 Other diseases of tongue: Secondary | ICD-10-CM | POA: Diagnosis not present

## 2022-10-03 DIAGNOSIS — E1151 Type 2 diabetes mellitus with diabetic peripheral angiopathy without gangrene: Secondary | ICD-10-CM | POA: Diagnosis not present

## 2022-10-03 DIAGNOSIS — D509 Iron deficiency anemia, unspecified: Secondary | ICD-10-CM | POA: Diagnosis not present

## 2022-10-03 DIAGNOSIS — M1A9XX0 Chronic gout, unspecified, without tophus (tophi): Secondary | ICD-10-CM | POA: Diagnosis not present

## 2022-10-03 DIAGNOSIS — E785 Hyperlipidemia, unspecified: Secondary | ICD-10-CM | POA: Diagnosis not present

## 2022-10-03 DIAGNOSIS — K219 Gastro-esophageal reflux disease without esophagitis: Secondary | ICD-10-CM | POA: Diagnosis not present

## 2022-10-03 DIAGNOSIS — Z794 Long term (current) use of insulin: Secondary | ICD-10-CM | POA: Diagnosis not present

## 2022-10-03 DIAGNOSIS — E1142 Type 2 diabetes mellitus with diabetic polyneuropathy: Secondary | ICD-10-CM | POA: Diagnosis not present

## 2022-10-03 DIAGNOSIS — I69393 Ataxia following cerebral infarction: Secondary | ICD-10-CM | POA: Diagnosis not present

## 2022-10-03 DIAGNOSIS — F3341 Major depressive disorder, recurrent, in partial remission: Secondary | ICD-10-CM | POA: Diagnosis not present

## 2022-10-03 DIAGNOSIS — Z791 Long term (current) use of non-steroidal anti-inflammatories (NSAID): Secondary | ICD-10-CM | POA: Diagnosis not present

## 2022-10-03 DIAGNOSIS — Z7984 Long term (current) use of oral hypoglycemic drugs: Secondary | ICD-10-CM | POA: Diagnosis not present

## 2022-10-03 DIAGNOSIS — Z7902 Long term (current) use of antithrombotics/antiplatelets: Secondary | ICD-10-CM | POA: Diagnosis not present

## 2022-10-03 DIAGNOSIS — I69391 Dysphagia following cerebral infarction: Secondary | ICD-10-CM | POA: Diagnosis not present

## 2022-10-03 DIAGNOSIS — I69354 Hemiplegia and hemiparesis following cerebral infarction affecting left non-dominant side: Secondary | ICD-10-CM | POA: Diagnosis not present

## 2022-10-03 DIAGNOSIS — M159 Polyosteoarthritis, unspecified: Secondary | ICD-10-CM | POA: Diagnosis not present

## 2022-10-03 DIAGNOSIS — J302 Other seasonal allergic rhinitis: Secondary | ICD-10-CM | POA: Diagnosis not present

## 2022-10-03 DIAGNOSIS — D291 Benign neoplasm of prostate: Secondary | ICD-10-CM | POA: Diagnosis not present

## 2022-10-03 DIAGNOSIS — F419 Anxiety disorder, unspecified: Secondary | ICD-10-CM | POA: Diagnosis not present

## 2022-10-03 DIAGNOSIS — K5901 Slow transit constipation: Secondary | ICD-10-CM | POA: Diagnosis not present

## 2022-10-03 DIAGNOSIS — M519 Unspecified thoracic, thoracolumbar and lumbosacral intervertebral disc disorder: Secondary | ICD-10-CM | POA: Diagnosis not present

## 2022-10-03 DIAGNOSIS — K635 Polyp of colon: Secondary | ICD-10-CM | POA: Diagnosis not present

## 2022-10-03 DIAGNOSIS — I2510 Atherosclerotic heart disease of native coronary artery without angina pectoris: Secondary | ICD-10-CM | POA: Diagnosis not present

## 2022-10-03 DIAGNOSIS — I10 Essential (primary) hypertension: Secondary | ICD-10-CM | POA: Diagnosis not present

## 2022-10-03 DIAGNOSIS — K922 Gastrointestinal hemorrhage, unspecified: Secondary | ICD-10-CM | POA: Diagnosis not present

## 2022-10-08 ENCOUNTER — Other Ambulatory Visit: Payer: Self-pay | Admitting: Oncology

## 2022-10-08 DIAGNOSIS — I69393 Ataxia following cerebral infarction: Secondary | ICD-10-CM | POA: Diagnosis not present

## 2022-10-08 DIAGNOSIS — M159 Polyosteoarthritis, unspecified: Secondary | ICD-10-CM | POA: Diagnosis not present

## 2022-10-08 DIAGNOSIS — I2510 Atherosclerotic heart disease of native coronary artery without angina pectoris: Secondary | ICD-10-CM | POA: Diagnosis not present

## 2022-10-08 DIAGNOSIS — E1142 Type 2 diabetes mellitus with diabetic polyneuropathy: Secondary | ICD-10-CM | POA: Diagnosis not present

## 2022-10-08 DIAGNOSIS — K5901 Slow transit constipation: Secondary | ICD-10-CM | POA: Diagnosis not present

## 2022-10-08 DIAGNOSIS — K148 Other diseases of tongue: Secondary | ICD-10-CM | POA: Diagnosis not present

## 2022-10-08 DIAGNOSIS — M519 Unspecified thoracic, thoracolumbar and lumbosacral intervertebral disc disorder: Secondary | ICD-10-CM | POA: Diagnosis not present

## 2022-10-08 DIAGNOSIS — Z791 Long term (current) use of non-steroidal anti-inflammatories (NSAID): Secondary | ICD-10-CM | POA: Diagnosis not present

## 2022-10-08 DIAGNOSIS — I10 Essential (primary) hypertension: Secondary | ICD-10-CM | POA: Diagnosis not present

## 2022-10-08 DIAGNOSIS — D509 Iron deficiency anemia, unspecified: Secondary | ICD-10-CM | POA: Diagnosis not present

## 2022-10-08 DIAGNOSIS — I69391 Dysphagia following cerebral infarction: Secondary | ICD-10-CM | POA: Diagnosis not present

## 2022-10-08 DIAGNOSIS — D291 Benign neoplasm of prostate: Secondary | ICD-10-CM | POA: Diagnosis not present

## 2022-10-08 DIAGNOSIS — D508 Other iron deficiency anemias: Secondary | ICD-10-CM

## 2022-10-08 DIAGNOSIS — F3341 Major depressive disorder, recurrent, in partial remission: Secondary | ICD-10-CM | POA: Diagnosis not present

## 2022-10-08 DIAGNOSIS — E785 Hyperlipidemia, unspecified: Secondary | ICD-10-CM | POA: Diagnosis not present

## 2022-10-08 DIAGNOSIS — K635 Polyp of colon: Secondary | ICD-10-CM | POA: Diagnosis not present

## 2022-10-08 DIAGNOSIS — I69354 Hemiplegia and hemiparesis following cerebral infarction affecting left non-dominant side: Secondary | ICD-10-CM | POA: Diagnosis not present

## 2022-10-08 DIAGNOSIS — K922 Gastrointestinal hemorrhage, unspecified: Secondary | ICD-10-CM | POA: Diagnosis not present

## 2022-10-08 DIAGNOSIS — E1151 Type 2 diabetes mellitus with diabetic peripheral angiopathy without gangrene: Secondary | ICD-10-CM | POA: Diagnosis not present

## 2022-10-08 DIAGNOSIS — Z794 Long term (current) use of insulin: Secondary | ICD-10-CM | POA: Diagnosis not present

## 2022-10-08 DIAGNOSIS — J302 Other seasonal allergic rhinitis: Secondary | ICD-10-CM | POA: Diagnosis not present

## 2022-10-08 DIAGNOSIS — M1A9XX0 Chronic gout, unspecified, without tophus (tophi): Secondary | ICD-10-CM | POA: Diagnosis not present

## 2022-10-08 DIAGNOSIS — Z7902 Long term (current) use of antithrombotics/antiplatelets: Secondary | ICD-10-CM | POA: Diagnosis not present

## 2022-10-08 DIAGNOSIS — K219 Gastro-esophageal reflux disease without esophagitis: Secondary | ICD-10-CM | POA: Diagnosis not present

## 2022-10-08 DIAGNOSIS — F419 Anxiety disorder, unspecified: Secondary | ICD-10-CM | POA: Diagnosis not present

## 2022-10-08 DIAGNOSIS — Z7984 Long term (current) use of oral hypoglycemic drugs: Secondary | ICD-10-CM | POA: Diagnosis not present

## 2022-10-08 NOTE — Progress Notes (Unsigned)
[facility]  [address] [place],  [place]  [address] [phone]  Clinic Day:  10/09/2022  Referring physician: Paulina Fusi, MD   HISTORY OF PRESENT ILLNESS:  The patient is a 72 y.o. male  who I was asked to consult upon for iron deficiency anemia.  Recent labs showed a low hemoglobin of 11.6, with an MCV of 86.  Iron studies done recently showed a low ferritin of 16.7, a serum iron of 48, an elevated TIBC of 595, and a low iron saturation of 8.1%.  According to the patient, he denies having any overt forms of blood loss recently.  In the past, he has had occasional blood in his stool, which he presumed to be due to his known hemorrhoids.  He has tried to take iron pills consistently on a daily basis, but it caused him to be constipated.  He also does not like that they cause black stools.  Outside records show that he had both an EGD and colonoscopy back in June 2021.  His EGD revealed moderate gastritis, as well as a single nonbleeding angiodysplastic lesion in his stomach.  It also showed multiple duodenal ulcers.  His colonoscopy done at the same time showed 1 small polyp in his distal sigmoid colon.  There were 2 small AVMs appreciated.  Furthermore, nonbleeding external and internal hemorrhoids were appreciated.  To his knowledge, there is no family history of either anemia or colorectal cancer.  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) 10/13/2019   DDD (degenerative disc disease), cervical    Depression    Dyslipidemia 05/29/2017   Essential hypertension 05/29/2017   Generalized osteoarthritis of  multiple sites    Gout    Hyperlipidemia    Hypertension, benign    Laryngopharyngeal reflux    Morbid obesity (HCC) 11/26/2018   Shortness of breath 07/17/2017   Sinus arrhythmia 11/26/2018   Stroke (HCC) 08/2022   [facility] hospitalized   Swelling 05/29/2017   Tobacco abuse    Type 2 diabetes mellitus without complication, without long-term current use of insulin (HCC) 05/29/2017   Uncontrolled type 2 diabetes mellitus with peripheral neuropathy     PAST SURGICAL HISTORY:   Past Surgical History:  Procedure Laterality Date   HERNIA REPAIR  1998   Abdominal   LEFT HEART CATH AND CORONARY ANGIOGRAPHY N/A 10/13/2019   Procedure: LEFT HEART CATH AND CORONARY ANGIOGRAPHY;  Surgeon: Yvonne Kendall, MD;  Location: [facility];  Service: Cardiovascular;  Laterality: N/A;   MINOR HEMORRHOIDECTOMY  1998   NASAL SINUS SURGERY  1978   SHOULDER SURGERY Left    X2   TONSILLECTOMY  1967    CURRENT MEDICATIONS:   Current Outpatient Medications  Medication Sig Dispense Refill   BD PEN NEEDLE NANO 2ND GEN 32G X 4 MM MISC      valACYclovir (VALTREX) 1000 MG tablet Take 2,000 mg by mouth 2 (two) times daily.     allopurinol (ZYLOPRIM) 300 MG tablet Take 300 mg by mouth daily.     amLODipine (NORVASC) 5 MG tablet Take 5 mg by mouth daily.  aspirin EC 81 MG tablet Take 81 mg by mouth daily. Swallow whole.     atorvastatin (LIPITOR) 80 MG tablet Take 80 mg by mouth at bedtime.      cetirizine (ZYRTEC) 10 MG tablet Take 10 mg by mouth daily.      citalopram (CELEXA) 20 MG tablet Take 20 mg by mouth daily.     clopidogrel (PLAVIX) 75 MG tablet Take 75 mg by mouth daily.     docusate sodium (COLACE) 100 MG capsule Take 100 mg by mouth daily.     doxycycline (VIBRA-TABS) 100 MG tablet Take 100 mg by mouth 2 (two) times daily.     FEROSUL 325 (65 Fe) MG tablet TAKE 1 TABLET EVERY OTHER DAY 45 tablet 2   finasteride (PROSCAR) 5 MG tablet Take 5 mg by mouth daily.     furosemide (LASIX) 20 MG  tablet Take 20 mg by mouth daily.     gabapentin (NEURONTIN) 300 MG capsule Take 300 mg by mouth 3 (three) times daily.     gemfibrozil (LOPID) 600 MG tablet Take 600 mg by mouth 2 (two) times daily before a meal.     glimepiride (AMARYL) 4 MG tablet Take 4 mg by mouth 2 (two) times daily.     isosorbide mononitrate (IMDUR) 30 MG 24 hr tablet Take 30 mg by mouth daily.     losartan (COZAAR) 100 MG tablet Take 100 mg by mouth at bedtime.      meloxicam (MOBIC) 7.5 MG tablet Take 7.5 mg by mouth 2 (two) times daily.     metFORMIN (GLUCOPHAGE) 1000 MG tablet Take 1 tablet (1,000 mg total) by mouth 2 (two) times daily with a meal.     metoprolol succinate (TOPROL-XL) 25 MG 24 hr tablet Take 12.5 mg by mouth daily.     montelukast (SINGULAIR) 10 MG tablet Take 10 mg by mouth daily.      naphazoline-pheniramine (NAPHCON-A) 0.025-0.3 % ophthalmic solution Place 1 drop into both eyes 3 (three) times daily as needed for eye irritation (dry/irritated/allergy eyes.).      nitroGLYCERIN (NITROSTAT) 0.4 MG SL tablet Place 0.4 mg under the tongue every 5 (five) minutes as needed for chest pain.     NOVOLOG FLEXPEN 100 UNIT/ML FlexPen Inject 20 Units into the skin in the morning, at noon, and at bedtime.      Omega-3 Fatty Acids (OMEGA 3 PO) Take 2 capsules by mouth daily. Omega XL     omeprazole (PRILOSEC) 40 MG capsule Take 40 mg by mouth at bedtime.     pantoprazole (PROTONIX) 40 MG tablet Take 40 mg by mouth daily. (Patient not taking: Reported on 09/25/2022)     potassium chloride SA (KLOR-CON M) 20 MEQ tablet Take 20 mEq by mouth daily.     tamsulosin (FLOMAX) 0.4 MG CAPS capsule Take 0.4 mg by mouth daily.     traMADol-acetaminophen (ULTRACET) 37.5-325 MG tablet Take 1 tablet by mouth as needed for moderate pain or severe pain.     TRESIBA FLEXTOUCH 100 UNIT/ML FlexTouch Pen Inject 60 Units into the skin daily.      No current facility-administered medications for this visit.    ALLERGIES:    Allergies  Allergen Reactions   Pseudoephedrine Other (See Comments)   Penicillins Rash   Tetracyclines & Related Rash    FAMILY HISTORY:   Family History  Problem Relation Age of Onset   Heart disease Mother    Heart attack Mother    Stroke  Father    Lung cancer Father    Alcohol abuse Father    Diabetes Brother    Stomach cancer Maternal Grandmother    Stomach cancer Paternal Grandmother    Colon cancer Neg Hx    Esophageal cancer Neg Hx    Rectal cancer Neg Hx     SOCIAL HISTORY:  The patient was born and raised in [place].  He currently lives in the [place] community.  He is widowed; he was previously married for 46 years.  He has 2 children and 3 grandchildren.  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  The patient smoked as much as 1-2 packs of cigarettes daily for 40 years before quitting 10 years ago.  REVIEW OF SYSTEMS:  Review of Systems  Constitutional:  Positive for fatigue. Negative for fever and unexpected weight change.  Respiratory:  Positive for shortness of breath. Negative for chest tightness, cough and hemoptysis.   Cardiovascular:  Positive for chest pain. Negative for palpitations.  Gastrointestinal:  Positive for constipation. Negative for abdominal distention, abdominal pain, blood in stool, diarrhea, nausea and vomiting.  Genitourinary:  Negative for dysuria, frequency and hematuria.   Musculoskeletal:  Positive for arthralgias and gait problem. Negative for back pain and myalgias.  Skin:  Negative for itching and rash.  Neurological:  Positive for gait problem. Negative for dizziness, headaches and light-headedness.  Psychiatric/Behavioral:  Negative for depression and suicidal ideas. The patient is not nervous/anxious.      PHYSICAL EXAM:  Blood pressure (!) 151/79, pulse 90, temperature (!) 97.5 F (36.4 C), resp. rate 18, height 5\' 11"  (1.803 m), weight  279 lb 6.4 oz (126.7 kg), SpO2 96%. Wt Readings from Last 3 Encounters:  10/09/22 279 lb 6.4 oz (126.7 kg)  09/25/22 276 lb 8 oz (125.4 kg)  05/10/21 283 lb (128.4 kg)   Body mass index is 38.97 kg/m. Performance status (ECOG): 2 - Symptomatic, <50% confined to bed Physical Exam Constitutional:      Appearance: Normal appearance. He is obese. He is not ill-appearing.  HENT:     Mouth/Throat:     Mouth: Mucous membranes are moist.     Pharynx: Oropharynx is clear. No oropharyngeal exudate or posterior oropharyngeal erythema.  Cardiovascular:     Rate and Rhythm: Normal rate and regular rhythm.     Heart sounds: No murmur heard.    No friction rub. No gallop.  Pulmonary:     Effort: Pulmonary effort is normal. No respiratory distress.     Breath sounds: Normal breath sounds. No wheezing, rhonchi or rales.  Abdominal:     General: Bowel sounds are normal. There is no distension.     Palpations: Abdomen is soft. There is no mass.     Tenderness: There is no abdominal tenderness.  Musculoskeletal:        General: No swelling.     Right lower leg: No edema.     Left lower leg: No edema.  Lymphadenopathy:     Cervical: No cervical adenopathy.     Upper Body:     Right upper body: No supraclavicular or axillary adenopathy.     Left upper body: No supraclavicular or axillary adenopathy.     Lower Body: No right inguinal adenopathy. No left inguinal adenopathy.  Skin:    General: Skin is warm.     Coloration: Skin is not jaundiced.  Findings: No lesion or rash.  Neurological:     General: No focal deficit present.     Mental Status: He is alert and oriented to person, place, and time. Mental status is at baseline.  Psychiatric:        Mood and Affect: Mood normal.        Behavior: Behavior normal.        Thought Content: Thought content normal.     LABS:       Latest Ref Rng & Units 09/25/2022   11:55 AM 07/26/2020    2:28 PM 01/05/2020   11:20 AM  CBC  WBC 4.0 -  10.5 K/uL 8.7  9.5  8.0   Hemoglobin 13.0 - 17.0 g/dL 16.1  09.6  04.5   Hematocrit 39.0 - 52.0 % 35.8  33.9  33.8   Platelets 150.0 - 400.0 K/uL 305.0  362  298       Latest Ref Rng & Units 09/25/2022   11:55 AM 07/26/2020    2:28 PM 01/05/2020   11:20 AM  CMP  Glucose 70 - 99 mg/dL 409  811  914   BUN 6 - 23 mg/dL 24  18  20    Creatinine 0.40 - 1.50 mg/dL 7.82  9.56  2.13   Sodium 135 - 145 mEq/L 136  139  137   Potassium 3.5 - 5.1 mEq/L 3.8  4.7  4.2   Chloride 96 - 112 mEq/L 97  100  98   CO2 19 - 32 mEq/L 29  22  24    Calcium 8.4 - 10.5 mg/dL 9.8  9.9  08.6   Total Protein 6.0 - 8.3 g/dL 8.0     Total Bilirubin 0.2 - 1.2 mg/dL 0.3     Alkaline Phos 39 - 117 U/L 67     AST 0 - 37 U/L 48     ALT 0 - 53 U/L 37      ASSESSMENT & PLAN:  A 72 y.o. male who I was asked to consult upon for iron deficiency anemia.  Based upon his EGD and colonoscopy done 3 years ago, my concern would be that some type of arteriovenous malformation may be leaking to where gradual blood loss and resultant iron deficiency are developing.  Ultimately, it would take another GI workup to prove this.  For now, I will arrange for him to receive IV iron over these next few weeks to rapidly replenish his iron stores and normalize his hemoglobin.  I will see him back in 3 months to reassess his iron and hemoglobin levels to see how well he responded to his upcoming IV iron.  The patient understands all the plans discussed today and is in agreement with them.  I do appreciate Paulina Fusi, MD for his new consult.   Navneet Schmuck Kirby Funk, MD

## 2022-10-09 ENCOUNTER — Inpatient Hospital Stay: Payer: Medicare HMO

## 2022-10-09 ENCOUNTER — Inpatient Hospital Stay: Payer: Medicare HMO | Attending: Oncology | Admitting: Oncology

## 2022-10-09 ENCOUNTER — Other Ambulatory Visit: Payer: Self-pay | Admitting: Oncology

## 2022-10-09 ENCOUNTER — Encounter: Payer: Self-pay | Admitting: Oncology

## 2022-10-09 VITALS — BP 151/79 | HR 90 | Temp 97.5°F | Resp 18 | Ht 71.0 in | Wt 279.4 lb

## 2022-10-09 DIAGNOSIS — R0602 Shortness of breath: Secondary | ICD-10-CM | POA: Diagnosis not present

## 2022-10-09 DIAGNOSIS — D508 Other iron deficiency anemias: Secondary | ICD-10-CM

## 2022-10-09 DIAGNOSIS — D509 Iron deficiency anemia, unspecified: Secondary | ICD-10-CM | POA: Insufficient documentation

## 2022-10-09 DIAGNOSIS — Z880 Allergy status to penicillin: Secondary | ICD-10-CM | POA: Insufficient documentation

## 2022-10-09 DIAGNOSIS — R2689 Other abnormalities of gait and mobility: Secondary | ICD-10-CM | POA: Diagnosis not present

## 2022-10-09 DIAGNOSIS — E669 Obesity, unspecified: Secondary | ICD-10-CM | POA: Diagnosis not present

## 2022-10-09 DIAGNOSIS — Z800 Family history of malignant neoplasm of digestive organs: Secondary | ICD-10-CM | POA: Insufficient documentation

## 2022-10-09 DIAGNOSIS — Z9089 Acquired absence of other organs: Secondary | ICD-10-CM | POA: Insufficient documentation

## 2022-10-09 DIAGNOSIS — E119 Type 2 diabetes mellitus without complications: Secondary | ICD-10-CM | POA: Insufficient documentation

## 2022-10-09 DIAGNOSIS — M2559 Pain in other specified joint: Secondary | ICD-10-CM | POA: Diagnosis not present

## 2022-10-09 DIAGNOSIS — R079 Chest pain, unspecified: Secondary | ICD-10-CM | POA: Diagnosis not present

## 2022-10-09 DIAGNOSIS — Z79899 Other long term (current) drug therapy: Secondary | ICD-10-CM | POA: Diagnosis not present

## 2022-10-09 DIAGNOSIS — Z811 Family history of alcohol abuse and dependence: Secondary | ICD-10-CM | POA: Insufficient documentation

## 2022-10-09 DIAGNOSIS — Z8719 Personal history of other diseases of the digestive system: Secondary | ICD-10-CM | POA: Insufficient documentation

## 2022-10-09 DIAGNOSIS — Z79624 Long term (current) use of inhibitors of nucleotide synthesis: Secondary | ICD-10-CM | POA: Insufficient documentation

## 2022-10-09 DIAGNOSIS — K269 Duodenal ulcer, unspecified as acute or chronic, without hemorrhage or perforation: Secondary | ICD-10-CM | POA: Insufficient documentation

## 2022-10-09 DIAGNOSIS — K644 Residual hemorrhoidal skin tags: Secondary | ICD-10-CM | POA: Insufficient documentation

## 2022-10-09 DIAGNOSIS — I2510 Atherosclerotic heart disease of native coronary artery without angina pectoris: Secondary | ICD-10-CM | POA: Insufficient documentation

## 2022-10-09 DIAGNOSIS — D500 Iron deficiency anemia secondary to blood loss (chronic): Secondary | ICD-10-CM

## 2022-10-09 DIAGNOSIS — Z87891 Personal history of nicotine dependence: Secondary | ICD-10-CM | POA: Insufficient documentation

## 2022-10-09 DIAGNOSIS — Z7902 Long term (current) use of antithrombotics/antiplatelets: Secondary | ICD-10-CM | POA: Insufficient documentation

## 2022-10-09 DIAGNOSIS — Z833 Family history of diabetes mellitus: Secondary | ICD-10-CM | POA: Insufficient documentation

## 2022-10-09 DIAGNOSIS — Z8673 Personal history of transient ischemic attack (TIA), and cerebral infarction without residual deficits: Secondary | ICD-10-CM | POA: Insufficient documentation

## 2022-10-09 DIAGNOSIS — Z801 Family history of malignant neoplasm of trachea, bronchus and lung: Secondary | ICD-10-CM | POA: Insufficient documentation

## 2022-10-09 DIAGNOSIS — Z8249 Family history of ischemic heart disease and other diseases of the circulatory system: Secondary | ICD-10-CM | POA: Insufficient documentation

## 2022-10-09 DIAGNOSIS — K921 Melena: Secondary | ICD-10-CM

## 2022-10-09 DIAGNOSIS — Z823 Family history of stroke: Secondary | ICD-10-CM | POA: Insufficient documentation

## 2022-10-09 DIAGNOSIS — R5383 Other fatigue: Secondary | ICD-10-CM | POA: Diagnosis not present

## 2022-10-09 DIAGNOSIS — K5900 Constipation, unspecified: Secondary | ICD-10-CM | POA: Diagnosis not present

## 2022-10-09 DIAGNOSIS — Z23 Encounter for immunization: Secondary | ICD-10-CM | POA: Insufficient documentation

## 2022-10-09 DIAGNOSIS — I10 Essential (primary) hypertension: Secondary | ICD-10-CM | POA: Insufficient documentation

## 2022-10-09 LAB — CBC AND DIFFERENTIAL
HCT: 36 — AB (ref 41–53)
Hemoglobin: 11.7 — AB (ref 13.5–17.5)
Neutrophils Absolute: 5.15
Platelets: 307 10*3/uL (ref 150–400)
WBC: 8.3

## 2022-10-09 LAB — CBC: RBC: 4.15 (ref 3.87–5.11)

## 2022-10-13 ENCOUNTER — Ambulatory Visit: Payer: Medicare HMO | Admitting: Psychiatry

## 2022-10-13 ENCOUNTER — Encounter: Payer: Self-pay | Admitting: Oncology

## 2022-10-13 ENCOUNTER — Encounter: Payer: Self-pay | Admitting: Psychiatry

## 2022-10-13 VITALS — BP 167/79 | HR 83 | Ht 71.0 in | Wt 282.2 lb

## 2022-10-13 DIAGNOSIS — J302 Other seasonal allergic rhinitis: Secondary | ICD-10-CM | POA: Diagnosis not present

## 2022-10-13 DIAGNOSIS — I69354 Hemiplegia and hemiparesis following cerebral infarction affecting left non-dominant side: Secondary | ICD-10-CM | POA: Diagnosis not present

## 2022-10-13 DIAGNOSIS — K5901 Slow transit constipation: Secondary | ICD-10-CM | POA: Diagnosis not present

## 2022-10-13 DIAGNOSIS — G629 Polyneuropathy, unspecified: Secondary | ICD-10-CM

## 2022-10-13 DIAGNOSIS — R413 Other amnesia: Secondary | ICD-10-CM

## 2022-10-13 DIAGNOSIS — I69391 Dysphagia following cerebral infarction: Secondary | ICD-10-CM | POA: Diagnosis not present

## 2022-10-13 DIAGNOSIS — F3341 Major depressive disorder, recurrent, in partial remission: Secondary | ICD-10-CM | POA: Diagnosis not present

## 2022-10-13 DIAGNOSIS — I2510 Atherosclerotic heart disease of native coronary artery without angina pectoris: Secondary | ICD-10-CM | POA: Diagnosis not present

## 2022-10-13 DIAGNOSIS — Z7902 Long term (current) use of antithrombotics/antiplatelets: Secondary | ICD-10-CM | POA: Diagnosis not present

## 2022-10-13 DIAGNOSIS — D291 Benign neoplasm of prostate: Secondary | ICD-10-CM | POA: Diagnosis not present

## 2022-10-13 DIAGNOSIS — D509 Iron deficiency anemia, unspecified: Secondary | ICD-10-CM | POA: Diagnosis not present

## 2022-10-13 DIAGNOSIS — K635 Polyp of colon: Secondary | ICD-10-CM | POA: Diagnosis not present

## 2022-10-13 DIAGNOSIS — E785 Hyperlipidemia, unspecified: Secondary | ICD-10-CM | POA: Diagnosis not present

## 2022-10-13 DIAGNOSIS — K922 Gastrointestinal hemorrhage, unspecified: Secondary | ICD-10-CM | POA: Diagnosis not present

## 2022-10-13 DIAGNOSIS — K219 Gastro-esophageal reflux disease without esophagitis: Secondary | ICD-10-CM | POA: Diagnosis not present

## 2022-10-13 DIAGNOSIS — M1A9XX0 Chronic gout, unspecified, without tophus (tophi): Secondary | ICD-10-CM | POA: Diagnosis not present

## 2022-10-13 DIAGNOSIS — E1142 Type 2 diabetes mellitus with diabetic polyneuropathy: Secondary | ICD-10-CM | POA: Diagnosis not present

## 2022-10-13 DIAGNOSIS — K148 Other diseases of tongue: Secondary | ICD-10-CM | POA: Diagnosis not present

## 2022-10-13 DIAGNOSIS — M159 Polyosteoarthritis, unspecified: Secondary | ICD-10-CM | POA: Diagnosis not present

## 2022-10-13 DIAGNOSIS — F419 Anxiety disorder, unspecified: Secondary | ICD-10-CM | POA: Diagnosis not present

## 2022-10-13 DIAGNOSIS — I69393 Ataxia following cerebral infarction: Secondary | ICD-10-CM | POA: Diagnosis not present

## 2022-10-13 DIAGNOSIS — Z7984 Long term (current) use of oral hypoglycemic drugs: Secondary | ICD-10-CM | POA: Diagnosis not present

## 2022-10-13 DIAGNOSIS — Z791 Long term (current) use of non-steroidal anti-inflammatories (NSAID): Secondary | ICD-10-CM | POA: Diagnosis not present

## 2022-10-13 DIAGNOSIS — M519 Unspecified thoracic, thoracolumbar and lumbosacral intervertebral disc disorder: Secondary | ICD-10-CM | POA: Diagnosis not present

## 2022-10-13 DIAGNOSIS — Z794 Long term (current) use of insulin: Secondary | ICD-10-CM | POA: Diagnosis not present

## 2022-10-13 DIAGNOSIS — E1151 Type 2 diabetes mellitus with diabetic peripheral angiopathy without gangrene: Secondary | ICD-10-CM | POA: Diagnosis not present

## 2022-10-13 DIAGNOSIS — I10 Essential (primary) hypertension: Secondary | ICD-10-CM | POA: Diagnosis not present

## 2022-10-13 MED ORDER — GABAPENTIN 300 MG PO CAPS: ORAL_CAPSULE | ORAL | 6 refills | Status: DC

## 2022-10-13 NOTE — Patient Instructions (Addendum)
Mild Cognitive Impairment  A person with mild cognitive impairment (MCI) experiences memory problems greater than normally expected with aging, but not serious enough to interfere with daily activities.  The patient with MCI complains of difficulty with memory. Typically, the complaints include trouble remembering the names of people they met recently, trouble remembering the flow of a conversation, and an increased tendency to misplace things, or similar problems. In many cases, the individual will be aware of these difficulties and will compensate with increased reliance on notes and calendars.   Although there is an increased chances of going on to develop dementia, it is not possible currently to predict with certainty which patients with MCI will or will not go on to develop dementia. There is currently no specific treatment for MCI. People leading sedentary lifestyles are at greater risk for developing dementia. Increased physical activity and brain exercise can help with maintaining brain function.  Tasks to improve attention/working memory 1. Good sleep hygiene (7-8 hrs of sleep) 2. Learning a new skill (Painting, Carpentry, Pottery, new language, Knitting). 3.Cognitive exercises (keep a daily journal, Puzzles) 4. Physical exercise and training  (30 min/day X 4 days week) 5. Being on Antidepressant if needed 6.Yoga, Meditation, Tai Chi 7. Decrease alcohol intake 8.Have a clear schedule and structure in daily routine  MIND Diet: The Mediterranean-DASH Diet Intervention for Neurodegenerative Delay, or MIND diet, targets the health of the aging brain. Research participants with the highest MIND diet scores had a significantly slower rate of cognitive decline compared with those with the lowest scores. The effects of the MIND diet on cognition showed greater effects than either the Mediterranean or the DASH diet alone.  The healthy items the MIND diet guidelines suggest include:  3+ servings  a day of whole grains 1+ servings a day of vegetables (other than green leafy) 6+ servings a week of green leafy vegetables 5+ servings a week of nuts 4+ meals a week of beans 2+ servings a week of berries 2+ meals a week of poultry 1+ meals a week of fish Mainly olive oil if added fat is used  The unhealthy items, which are higher in saturated and trans fat, include: Less than 5 servings a week of pastries and sweets Less than 4 servings a week of red meat (including beef, pork, lamb, and products made from these meats) Less than one serving a week of cheese and fried foods Less than 1 tablespoon a day of butter/stick margarine  -------------------------  Behavioral Changes After Stroke  A stroke can lead to changes in your behaviour. This can be due to the damage to your brain, or it might be linked to emotional problems.      Why am I behaving differently? The way we behave often depends on the way we feel. A stroke is sudden and shocking, and there are many emotions to deal with when you have one. It's normal for this to affect your behaviour.  But it's not just about the way we feel. We're constantly receiving information from the world around [place]. Our brain has to understand, organise and store this information. This is called cognition.  Our brain uses this information to adjust the way we think and react. If the parts of your brain that do this are damaged by a stroke, this can change the way you behave.  Other effects of stroke can also affect your behaviour. Pain can make you irritable, for example. Frustration at not being able to do things can  make you angry or even aggressive towards others. Fatigue (tiredness that doesn't improve with rest) is common after a stroke. Fatigue can make you avoid social situations, or become irritable more easily.  What kinds of problems can this cause? Apathy Apathy is when you lack motivation to do things. You may lose interest in life and  not want to take part in everyday activities. Things that you would usually respond to, like good news or seeing someone upset, may not make you feel anything at all.  Apathy is fairly common after a stroke, although it's more likely to affect you if you have severe physical or cognitive problems after your stroke. It can be a sign of depression, but it can also happen on its own.  Having a structure to the day can sometimes help people with apathy. Get family members or friends to support you with practical help. They can encourage you to start and finish tasks. Sticking to a regular routine might be helpful.  Anger and aggression Many people find themselves getting frustrated or angry after their stroke. You may lose your temper for no reason or get angry about things that never would have made you feel that way before. If your anger turns into aggression, you may shout, throw things, threaten people or try to hurt them.  Aggressive behaviour puts you and other people at risk. So it's essential that you learn how to manage it. If you need support, contact your PCP who may refer you for expert help.  Inappropriate behaviour People usually think that behaviour is inappropriate when it 'breaks the rules' and makes other people feel uncomfortable. This behaviour can happen after a stroke for a number of reasons.  If you lose the ability to read social situations, you will not know what's expected of you. You may not realise you are behaving inappropriately. You may stand too close to other people. You may interrupt them when they're talking, or not respond to their body language. You may not be able to think decisions through properly. You may make tactless remarks, act impulsively or spend money unwisely. You may also lose some of your inhibitions. If this happens, you may be more impulsive. You could seem more self-centred and refuse to do anything if it does not give immediate results. This can also  affect your sexual behaviour. You may crave more physical intimacy or make inappropriate comments to other people.  Will I go back to how I was? It's common for behaviour to change in some way after a stroke. As you begin to recover, you might feel that your behaviour changes or improves. You may start feeling better physically and emotionally. But some changes can be long-term. You are still the same person, but a stroke may change the way you respond to things.  It's not always possible to go back to the way you were before a stroke, but you can get help and support to make the best recovery possible for you.  It can be hard for the people around you if they feel you've changed. They may also need some support to help them understand what's happened to you.  However, if you or people around you notice that you're behaving aggressively or inappropriately, it's vital to do something about it. The information on this page could help you understand what to do.  What can I do about my behaviour? 1. Listen to others It's very difficult to see changes in our own behaviour. So if  you're acting differently, your friends and family will probably be the ones to notice. That's why it's important to listen to them if they mention your behaviour.  People often talk about their loved one's 'personality' changing after a stroke or claim that they've 'become a different person'. It can be upsetting if your family or friends say this about you.  However, what they're really noticing are changes to your behaviour, not who you are as a person. A stroke cannot change who you are.  2. Give it time Some change to your behaviour is to be expected. Although it may be difficult to live with at times, it's likely to improve. Many people find that they have to learn what's 'normal' for them again after they've had a stroke. This will take time, for you and the people around you.  3. Talk to someone Talking about the  way you're feeling with someone who understands can really help. You may want to do this with a professional, such as a Haematologist or therapist. Or you could talk to a family member or friend. Find who you feel most comfortable talking to.  Many people also find support groups helpful, because you can talk about your problems with people who are going through the same thing. Stroke clubs and groups are a good way to meet other stroke survivors and get advice and support.  If the change to your behaviour is extreme, or you start to behave in ways that may hurt or offend other people, contact your GP surgeryto ask for help.  Getting help The way you're behaving may be a sign of emotional problems, such as depression or anxiety.   Talking therapy can help with emotional problems. You can talk about difficult feelings with a trained therapist and think about how your feelings may be affecting your behaviour.  You may need more checks to see if there are any cognitive problems (changes to your memory and thinking) that may be causing you to behave differently. Cognitive problems can affect things like your ability to plan and complete everyday tasks. Your doctor may refer you to a specialist such as a clinical neuropsychologist.  What can I do if I'm becoming aggressive? 1. Learn your triggers Talk to your friends and family to work out what makes you angry and how you can avoid it. If you have fatigue and your behaviour changes when you are tired, try telling loved ones so they can understand how you feel. Try to get more rest or do things earlier in the day when you have more energy. Or if it happens when you're bored, try to keep busy and plan activities, so you do not end up with days when there's nothing to do.  2. Develop a strategy Agree a word, phrase or sign that your family and friends can use to let you know that you are acting aggressively. Or agree that they walk away and leave you on your  own for 15 minutes.  What can I do if I'm behaving inappropriately? 1. Do not ignore it The way you behave will seem entirely normal to you, even if it seems inappropriate to everyone else. So it's important to listen to the people around you. It may not mean that the way you're behaving is wrong. But it may mean you need to try to manage it a little better and help your family and friends adjust to it.  2. Try talking therapy Cognitive behavioural therapy (CBT) is a  type of talking therapy that focuses on your thinking and behaviour and how they are connected. CBT may help you to learn how to think through your actions and understand the effect they can have on other people.  A therapist could also work with your family members, to help them adjust to your changed behaviour and help you all find a way to be comfortable with it. To find talking therapies in your area, visit the [facility] website or contact your GP surgery.  Tips for family and friends It can be difficult when someone close to you starts to behave differently, especially if they're being aggressive or behaving inappropriately. Here are some tips to help you.  Talk about it in the right way If your friend or family member is behaving in a way that upsets you, you have a right to tell them. But be careful not to make them feel that they are the problem. Keep your focus on their behaviour and how it makes you feel. Say "it upsets me when you shout" rather than "you're always upsetting me".  Don't do it all on your own Get as much support as you can. Contact your GP surgeryto find out about services in your area that can offer support. Talking to a therapist or counsellor may help you come to terms with the changes.  Dealing with apathy Apathy can be very difficult to deal with. It can feel as if your friend or family member has completely changed. But remember, they are still the same person and it's likely to get better with time. Having  a regular routine can help. You could try encouraging them to plan their days and start and finish activities.  Dealing with anger or aggression Sometimes, people can become angry because they feel ignored or not in control. Make sure you listen to your friend or family member and involve them in decisions. For example, even if they can't make their own lunch, they may still want to choose what they have to eat.  Don't put yourself at risk It's essential that you look after your own safety, so don't feel bad about walking away if you need to. Just because someone has had a stroke, it doesn't give them the right to hurt you.  If your friend or family member becomes verbally or physically aggressive, try not to raise your voice - it won't help if you become angry too. Leave them alone until they've calmed down. If you are in danger, call 999.  Dealing with inappropriate behaviour Explain what's acceptable and what's not If your friend or family member is acting inappropriately, make it clear that their behaviour is not acceptable. Be firm but not rude - remember they will not be doing it deliberately. Praise them when they do act in the right way.  Get everyone on board Being consistent is important. Talk to other friends, family members and carers to ensure that they deal with inappropriate behaviour in the same way. It will help to get the message across if everyone says the same thing.    Be willing to compromise Just because your friend or family member's behaviour has changed, it doesn't automatically make it wrong. It may just be a matter of adjusting to it. Talk honestly about it with them and find a compromise that you're both comfortable with.
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
## 2022-10-14 ENCOUNTER — Telehealth: Payer: Self-pay | Admitting: Psychiatry

## 2022-10-14 ENCOUNTER — Telehealth: Payer: Self-pay | Admitting: Oncology

## 2022-10-14 DIAGNOSIS — R413 Other amnesia: Secondary | ICD-10-CM

## 2022-10-14 LAB — VITAMIN B12: Vitamin B-12: 249 pg/mL (ref 232–1245)

## 2022-10-14 LAB — TSH: TSH: 6.69 u[IU]/mL — ABNORMAL HIGH (ref 0.450–4.500)

## 2022-10-14 NOTE — Telephone Encounter (Signed)
At 2:16 pt left a vm stating his Dr in [place] is asking for a note from Dr Delena Bali stating that pt is able to drive.  Pt is asking to be called to discuss this further.

## 2022-10-14 NOTE — Telephone Encounter (Signed)
Contacted pt to schedule an appt. Unable to reach via phone, voicemail was full.    Scheduling Message Entered by Rennis Harding A on 10/09/2022 at  2:15 PM Priority: Routine <No visit type provided>  Department: [facility]  Provider:  Scheduling Notes:  IV iron asap  Labs/appt 01-08-23

## 2022-10-15 DIAGNOSIS — K1321 Leukoplakia of oral mucosa, including tongue: Secondary | ICD-10-CM | POA: Diagnosis not present

## 2022-10-15 NOTE — Telephone Encounter (Signed)
Let's have him go to neuropsych first. I'll place a referral for him, thanks!

## 2022-10-15 NOTE — Addendum Note (Signed)
Addended by: Ocie Doyne on: 10/15/2022 09:28 AM   Modules accepted: Orders

## 2022-10-15 NOTE — Telephone Encounter (Signed)
I called patient and informed him, pt verbalized he understood.

## 2022-10-16 ENCOUNTER — Telehealth: Payer: Self-pay | Admitting: Psychiatry

## 2022-10-16 NOTE — Telephone Encounter (Signed)
Referral for neuropsychology fax [facility]. Phone: [phone], Fax: [phone].

## 2022-10-20 MED FILL — Iron Sucrose Inj 20 MG/ML (Fe Equiv): INTRAVENOUS | Qty: 10 | Status: AC

## 2022-10-21 ENCOUNTER — Inpatient Hospital Stay: Payer: Medicare HMO

## 2022-10-21 VITALS — BP 153/83 | HR 75 | Temp 97.5°F | Resp 16 | Ht 71.0 in | Wt 279.4 lb

## 2022-10-21 DIAGNOSIS — E119 Type 2 diabetes mellitus without complications: Secondary | ICD-10-CM | POA: Diagnosis not present

## 2022-10-21 DIAGNOSIS — Z87891 Personal history of nicotine dependence: Secondary | ICD-10-CM | POA: Diagnosis not present

## 2022-10-21 DIAGNOSIS — Z9089 Acquired absence of other organs: Secondary | ICD-10-CM | POA: Diagnosis not present

## 2022-10-21 DIAGNOSIS — K5900 Constipation, unspecified: Secondary | ICD-10-CM | POA: Diagnosis not present

## 2022-10-21 DIAGNOSIS — Z79624 Long term (current) use of inhibitors of nucleotide synthesis: Secondary | ICD-10-CM | POA: Diagnosis not present

## 2022-10-21 DIAGNOSIS — Z7902 Long term (current) use of antithrombotics/antiplatelets: Secondary | ICD-10-CM | POA: Diagnosis not present

## 2022-10-21 DIAGNOSIS — Z800 Family history of malignant neoplasm of digestive organs: Secondary | ICD-10-CM | POA: Diagnosis not present

## 2022-10-21 DIAGNOSIS — Z811 Family history of alcohol abuse and dependence: Secondary | ICD-10-CM | POA: Diagnosis not present

## 2022-10-21 DIAGNOSIS — I2510 Atherosclerotic heart disease of native coronary artery without angina pectoris: Secondary | ICD-10-CM | POA: Diagnosis not present

## 2022-10-21 DIAGNOSIS — Z8673 Personal history of transient ischemic attack (TIA), and cerebral infarction without residual deficits: Secondary | ICD-10-CM | POA: Diagnosis not present

## 2022-10-21 DIAGNOSIS — Z833 Family history of diabetes mellitus: Secondary | ICD-10-CM | POA: Diagnosis not present

## 2022-10-21 DIAGNOSIS — Z8249 Family history of ischemic heart disease and other diseases of the circulatory system: Secondary | ICD-10-CM | POA: Diagnosis not present

## 2022-10-21 DIAGNOSIS — Z880 Allergy status to penicillin: Secondary | ICD-10-CM | POA: Diagnosis not present

## 2022-10-21 DIAGNOSIS — Z8719 Personal history of other diseases of the digestive system: Secondary | ICD-10-CM | POA: Diagnosis not present

## 2022-10-21 DIAGNOSIS — K269 Duodenal ulcer, unspecified as acute or chronic, without hemorrhage or perforation: Secondary | ICD-10-CM | POA: Diagnosis not present

## 2022-10-21 DIAGNOSIS — D500 Iron deficiency anemia secondary to blood loss (chronic): Secondary | ICD-10-CM

## 2022-10-21 DIAGNOSIS — D509 Iron deficiency anemia, unspecified: Secondary | ICD-10-CM | POA: Diagnosis not present

## 2022-10-21 DIAGNOSIS — Z23 Encounter for immunization: Secondary | ICD-10-CM | POA: Diagnosis not present

## 2022-10-21 DIAGNOSIS — Z79899 Other long term (current) drug therapy: Secondary | ICD-10-CM | POA: Diagnosis not present

## 2022-10-21 DIAGNOSIS — Z823 Family history of stroke: Secondary | ICD-10-CM | POA: Diagnosis not present

## 2022-10-21 DIAGNOSIS — Z801 Family history of malignant neoplasm of trachea, bronchus and lung: Secondary | ICD-10-CM | POA: Diagnosis not present

## 2022-10-21 DIAGNOSIS — K644 Residual hemorrhoidal skin tags: Secondary | ICD-10-CM | POA: Diagnosis not present

## 2022-10-21 DIAGNOSIS — I10 Essential (primary) hypertension: Secondary | ICD-10-CM | POA: Diagnosis not present

## 2022-10-21 MED ORDER — SODIUM CHLORIDE 0.9 % IV SOLN: Freq: Once | INTRAVENOUS | Status: AC

## 2022-10-21 MED ORDER — SODIUM CHLORIDE 0.9 % IV SOLN
200.0000 mg | Freq: Once | INTRAVENOUS | Status: AC
  Administered 2022-10-21: 200 mg via INTRAVENOUS
  Filled 2022-10-21: qty 200

## 2022-10-21 NOTE — Patient Instructions (Signed)
Iron Sucrose Injection What is this medication? IRON SUCROSE (EYE ern SOO krose) treats low levels of iron (iron deficiency anemia) in people with kidney disease. Iron is a mineral that plays an important role in making red blood cells, which carry oxygen from your lungs to the rest of your body. This medicine may be used for other purposes; ask your health care provider or pharmacist if you have questions. COMMON BRAND NAME(S): Venofer What should I tell my care team before I take this medication? They need to know if you have any of these conditions: Anemia not caused by low iron levels Heart disease High levels of iron in the blood Kidney disease Liver disease An unusual or allergic reaction to iron, other medications, foods, dyes, or preservatives Pregnant or trying to get pregnant Breastfeeding How should I use this medication? This medication is for infusion into a vein. It is given in a hospital or clinic setting. Talk to your care team about the use of this medication in children. While this medication may be prescribed for children as young as 2 years for selected conditions, precautions do apply. Overdosage: If you think you have taken too much of this medicine contact a poison control center or emergency room at once. NOTE: This medicine is only for you. Do not share this medicine with others. What if I miss a dose? Keep appointments for follow-up doses. It is important not to miss your dose. Call your care team if you are unable to keep an appointment. What may interact with this medication? Do not take this medication with any of the following: Deferoxamine Dimercaprol Other iron products This medication may also interact with the following: Chloramphenicol Deferasirox This list may not describe all possible interactions. Give your health care provider a list of all the medicines, herbs, non-prescription drugs, or dietary supplements you use. Also tell them if you smoke,  drink alcohol, or use illegal drugs. Some items may interact with your medicine. What should I watch for while using this medication? Visit your care team regularly. Tell your care team if your symptoms do not start to get better or if they get worse. You may need blood work done while you are taking this medication. You may need to follow a special diet. Talk to your care team. Foods that contain iron include: whole grains/cereals, dried fruits, beans, or peas, leafy green vegetables, and organ meats (liver, kidney). What side effects may I notice from receiving this medication? Side effects that you should report to your care team as soon as possible: Allergic reactions--skin rash, itching, hives, swelling of the face, lips, tongue, or throat Low blood pressure--dizziness, feeling faint or lightheaded, blurry vision Shortness of breath Side effects that usually do not require medical attention (report to your care team if they continue or are bothersome): Flushing Headache Joint pain Muscle pain Nausea Pain, redness, or irritation at injection site This list may not describe all possible side effects. Call your doctor for medical advice about side effects. You may report side effects to FDA at 1-800-FDA-1088. Where should I keep my medication? This medication is given in a hospital or clinic. It will not be stored at home. NOTE: This sheet is a summary. It may not cover all possible information. If you have questions about this medicine, talk to your doctor, pharmacist, or health care provider.  2024 Elsevier/Gold Standard (2022-06-27 00:00:00)

## 2022-10-22 MED FILL — Iron Sucrose Inj 20 MG/ML (Fe Equiv): INTRAVENOUS | Qty: 10 | Status: AC

## 2022-10-23 ENCOUNTER — Inpatient Hospital Stay: Payer: Medicare HMO

## 2022-10-23 VITALS — BP 157/79 | HR 75 | Temp 97.8°F | Resp 16 | Ht 71.0 in | Wt 279.0 lb

## 2022-10-23 DIAGNOSIS — E119 Type 2 diabetes mellitus without complications: Secondary | ICD-10-CM | POA: Diagnosis not present

## 2022-10-23 DIAGNOSIS — Z880 Allergy status to penicillin: Secondary | ICD-10-CM | POA: Diagnosis not present

## 2022-10-23 DIAGNOSIS — K644 Residual hemorrhoidal skin tags: Secondary | ICD-10-CM | POA: Diagnosis not present

## 2022-10-23 DIAGNOSIS — Z79899 Other long term (current) drug therapy: Secondary | ICD-10-CM | POA: Diagnosis not present

## 2022-10-23 DIAGNOSIS — Z87891 Personal history of nicotine dependence: Secondary | ICD-10-CM | POA: Diagnosis not present

## 2022-10-23 DIAGNOSIS — K5900 Constipation, unspecified: Secondary | ICD-10-CM | POA: Diagnosis not present

## 2022-10-23 DIAGNOSIS — I10 Essential (primary) hypertension: Secondary | ICD-10-CM | POA: Diagnosis not present

## 2022-10-23 DIAGNOSIS — Z79624 Long term (current) use of inhibitors of nucleotide synthesis: Secondary | ICD-10-CM | POA: Diagnosis not present

## 2022-10-23 DIAGNOSIS — Z823 Family history of stroke: Secondary | ICD-10-CM | POA: Diagnosis not present

## 2022-10-23 DIAGNOSIS — Z801 Family history of malignant neoplasm of trachea, bronchus and lung: Secondary | ICD-10-CM | POA: Diagnosis not present

## 2022-10-23 DIAGNOSIS — K269 Duodenal ulcer, unspecified as acute or chronic, without hemorrhage or perforation: Secondary | ICD-10-CM | POA: Diagnosis not present

## 2022-10-23 DIAGNOSIS — Z7902 Long term (current) use of antithrombotics/antiplatelets: Secondary | ICD-10-CM | POA: Diagnosis not present

## 2022-10-23 DIAGNOSIS — I2510 Atherosclerotic heart disease of native coronary artery without angina pectoris: Secondary | ICD-10-CM | POA: Diagnosis not present

## 2022-10-23 DIAGNOSIS — D500 Iron deficiency anemia secondary to blood loss (chronic): Secondary | ICD-10-CM

## 2022-10-23 DIAGNOSIS — Z9089 Acquired absence of other organs: Secondary | ICD-10-CM | POA: Diagnosis not present

## 2022-10-23 DIAGNOSIS — Z23 Encounter for immunization: Secondary | ICD-10-CM | POA: Diagnosis not present

## 2022-10-23 DIAGNOSIS — Z8719 Personal history of other diseases of the digestive system: Secondary | ICD-10-CM | POA: Diagnosis not present

## 2022-10-23 DIAGNOSIS — Z800 Family history of malignant neoplasm of digestive organs: Secondary | ICD-10-CM | POA: Diagnosis not present

## 2022-10-23 DIAGNOSIS — Z8673 Personal history of transient ischemic attack (TIA), and cerebral infarction without residual deficits: Secondary | ICD-10-CM | POA: Diagnosis not present

## 2022-10-23 DIAGNOSIS — Z833 Family history of diabetes mellitus: Secondary | ICD-10-CM | POA: Diagnosis not present

## 2022-10-23 DIAGNOSIS — D509 Iron deficiency anemia, unspecified: Secondary | ICD-10-CM | POA: Diagnosis not present

## 2022-10-23 DIAGNOSIS — Z811 Family history of alcohol abuse and dependence: Secondary | ICD-10-CM | POA: Diagnosis not present

## 2022-10-23 DIAGNOSIS — Z8249 Family history of ischemic heart disease and other diseases of the circulatory system: Secondary | ICD-10-CM | POA: Diagnosis not present

## 2022-10-23 MED ORDER — SODIUM CHLORIDE 0.9 % IV SOLN
200.0000 mg | Freq: Once | INTRAVENOUS | Status: AC
  Administered 2022-10-23: 200 mg via INTRAVENOUS
  Filled 2022-10-23: qty 200

## 2022-10-23 MED ORDER — SODIUM CHLORIDE 0.9 % IV SOLN: Freq: Once | INTRAVENOUS | Status: AC

## 2022-10-23 NOTE — Patient Instructions (Signed)
Iron Sucrose Injection What is this medication? IRON SUCROSE (EYE ern SOO krose) treats low levels of iron (iron deficiency anemia) in people with kidney disease. Iron is a mineral that plays an important role in making red blood cells, which carry oxygen from your lungs to the rest of your body. This medicine may be used for other purposes; ask your health care provider or pharmacist if you have questions. COMMON BRAND NAME(S): Venofer What should I tell my care team before I take this medication? They need to know if you have any of these conditions: Anemia not caused by low iron levels Heart disease High levels of iron in the blood Kidney disease Liver disease An unusual or allergic reaction to iron, other medications, foods, dyes, or preservatives Pregnant or trying to get pregnant Breastfeeding How should I use this medication? This medication is for infusion into a vein. It is given in a hospital or clinic setting. Talk to your care team about the use of this medication in children. While this medication may be prescribed for children as young as 2 years for selected conditions, precautions do apply. Overdosage: If you think you have taken too much of this medicine contact a poison control center or emergency room at once. NOTE: This medicine is only for you. Do not share this medicine with others. What if I miss a dose? Keep appointments for follow-up doses. It is important not to miss your dose. Call your care team if you are unable to keep an appointment. What may interact with this medication? Do not take this medication with any of the following: Deferoxamine Dimercaprol Other iron products This medication may also interact with the following: Chloramphenicol Deferasirox This list may not describe all possible interactions. Give your health care provider a list of all the medicines, herbs, non-prescription drugs, or dietary supplements you use. Also tell them if you smoke,  drink alcohol, or use illegal drugs. Some items may interact with your medicine. What should I watch for while using this medication? Visit your care team regularly. Tell your care team if your symptoms do not start to get better or if they get worse. You may need blood work done while you are taking this medication. You may need to follow a special diet. Talk to your care team. Foods that contain iron include: whole grains/cereals, dried fruits, beans, or peas, leafy green vegetables, and organ meats (liver, kidney). What side effects may I notice from receiving this medication? Side effects that you should report to your care team as soon as possible: Allergic reactions--skin rash, itching, hives, swelling of the face, lips, tongue, or throat Low blood pressure--dizziness, feeling faint or lightheaded, blurry vision Shortness of breath Side effects that usually do not require medical attention (report to your care team if they continue or are bothersome): Flushing Headache Joint pain Muscle pain Nausea Pain, redness, or irritation at injection site This list may not describe all possible side effects. Call your doctor for medical advice about side effects. You may report side effects to FDA at 1-800-FDA-1088. Where should I keep my medication? This medication is given in a hospital or clinic. It will not be stored at home. NOTE: This sheet is a summary. It may not cover all possible information. If you have questions about this medicine, talk to your doctor, pharmacist, or health care provider.  2024 Elsevier/Gold Standard (2022-06-27 00:00:00)

## 2022-10-24 ENCOUNTER — Encounter: Payer: Self-pay | Admitting: Oncology

## 2022-10-24 MED FILL — Iron Sucrose Inj 20 MG/ML (Fe Equiv): INTRAVENOUS | Qty: 10 | Status: AC

## 2022-10-27 ENCOUNTER — Inpatient Hospital Stay: Payer: Medicare HMO

## 2022-10-27 VITALS — BP 164/70 | HR 67 | Temp 98.7°F | Resp 20 | Ht 71.0 in | Wt 279.0 lb

## 2022-10-27 DIAGNOSIS — Z7902 Long term (current) use of antithrombotics/antiplatelets: Secondary | ICD-10-CM | POA: Diagnosis not present

## 2022-10-27 DIAGNOSIS — Z87891 Personal history of nicotine dependence: Secondary | ICD-10-CM | POA: Diagnosis not present

## 2022-10-27 DIAGNOSIS — Z833 Family history of diabetes mellitus: Secondary | ICD-10-CM | POA: Diagnosis not present

## 2022-10-27 DIAGNOSIS — Z79899 Other long term (current) drug therapy: Secondary | ICD-10-CM | POA: Diagnosis not present

## 2022-10-27 DIAGNOSIS — Z8673 Personal history of transient ischemic attack (TIA), and cerebral infarction without residual deficits: Secondary | ICD-10-CM | POA: Diagnosis not present

## 2022-10-27 DIAGNOSIS — Z800 Family history of malignant neoplasm of digestive organs: Secondary | ICD-10-CM | POA: Diagnosis not present

## 2022-10-27 DIAGNOSIS — Z880 Allergy status to penicillin: Secondary | ICD-10-CM | POA: Diagnosis not present

## 2022-10-27 DIAGNOSIS — I10 Essential (primary) hypertension: Secondary | ICD-10-CM | POA: Diagnosis not present

## 2022-10-27 DIAGNOSIS — L859 Epidermal thickening, unspecified: Secondary | ICD-10-CM | POA: Diagnosis not present

## 2022-10-27 DIAGNOSIS — D509 Iron deficiency anemia, unspecified: Secondary | ICD-10-CM | POA: Diagnosis not present

## 2022-10-27 DIAGNOSIS — E119 Type 2 diabetes mellitus without complications: Secondary | ICD-10-CM | POA: Diagnosis not present

## 2022-10-27 DIAGNOSIS — K5900 Constipation, unspecified: Secondary | ICD-10-CM | POA: Diagnosis not present

## 2022-10-27 DIAGNOSIS — K269 Duodenal ulcer, unspecified as acute or chronic, without hemorrhage or perforation: Secondary | ICD-10-CM | POA: Diagnosis not present

## 2022-10-27 DIAGNOSIS — Z23 Encounter for immunization: Secondary | ICD-10-CM | POA: Diagnosis not present

## 2022-10-27 DIAGNOSIS — Z811 Family history of alcohol abuse and dependence: Secondary | ICD-10-CM | POA: Diagnosis not present

## 2022-10-27 DIAGNOSIS — I2510 Atherosclerotic heart disease of native coronary artery without angina pectoris: Secondary | ICD-10-CM | POA: Diagnosis not present

## 2022-10-27 DIAGNOSIS — D500 Iron deficiency anemia secondary to blood loss (chronic): Secondary | ICD-10-CM

## 2022-10-27 DIAGNOSIS — Z9089 Acquired absence of other organs: Secondary | ICD-10-CM | POA: Diagnosis not present

## 2022-10-27 DIAGNOSIS — Z8249 Family history of ischemic heart disease and other diseases of the circulatory system: Secondary | ICD-10-CM | POA: Diagnosis not present

## 2022-10-27 DIAGNOSIS — Z801 Family history of malignant neoplasm of trachea, bronchus and lung: Secondary | ICD-10-CM | POA: Diagnosis not present

## 2022-10-27 DIAGNOSIS — Z79624 Long term (current) use of inhibitors of nucleotide synthesis: Secondary | ICD-10-CM | POA: Diagnosis not present

## 2022-10-27 DIAGNOSIS — K644 Residual hemorrhoidal skin tags: Secondary | ICD-10-CM | POA: Diagnosis not present

## 2022-10-27 DIAGNOSIS — Z8719 Personal history of other diseases of the digestive system: Secondary | ICD-10-CM | POA: Diagnosis not present

## 2022-10-27 DIAGNOSIS — Z823 Family history of stroke: Secondary | ICD-10-CM | POA: Diagnosis not present

## 2022-10-27 MED ORDER — SODIUM CHLORIDE 0.9 % IV SOLN: Freq: Once | INTRAVENOUS | Status: AC

## 2022-10-27 MED ORDER — SODIUM CHLORIDE 0.9 % IV SOLN
200.0000 mg | Freq: Once | INTRAVENOUS | Status: AC
  Administered 2022-10-27: 200 mg via INTRAVENOUS
  Filled 2022-10-27: qty 200

## 2022-10-27 NOTE — Patient Instructions (Signed)
Iron Sucrose Injection What is this medication? IRON SUCROSE (EYE ern SOO krose) treats low levels of iron (iron deficiency anemia) in people with kidney disease. Iron is a mineral that plays an important role in making red blood cells, which carry oxygen from your lungs to the rest of your body. This medicine may be used for other purposes; ask your health care provider or pharmacist if you have questions. COMMON BRAND NAME(S): Venofer What should I tell my care team before I take this medication? They need to know if you have any of these conditions: Anemia not caused by low iron levels Heart disease High levels of iron in the blood Kidney disease Liver disease An unusual or allergic reaction to iron, other medications, foods, dyes, or preservatives Pregnant or trying to get pregnant Breastfeeding How should I use this medication? This medication is for infusion into a vein. It is given in a hospital or clinic setting. Talk to your care team about the use of this medication in children. While this medication may be prescribed for children as young as 2 years for selected conditions, precautions do apply. Overdosage: If you think you have taken too much of this medicine contact a poison control center or emergency room at once. NOTE: This medicine is only for you. Do not share this medicine with others. What if I miss a dose? Keep appointments for follow-up doses. It is important not to miss your dose. Call your care team if you are unable to keep an appointment. What may interact with this medication? Do not take this medication with any of the following: Deferoxamine Dimercaprol Other iron products This medication may also interact with the following: Chloramphenicol Deferasirox This list may not describe all possible interactions. Give your health care provider a list of all the medicines, herbs, non-prescription drugs, or dietary supplements you use. Also tell them if you smoke,  drink alcohol, or use illegal drugs. Some items may interact with your medicine. What should I watch for while using this medication? Visit your care team regularly. Tell your care team if your symptoms do not start to get better or if they get worse. You may need blood work done while you are taking this medication. You may need to follow a special diet. Talk to your care team. Foods that contain iron include: whole grains/cereals, dried fruits, beans, or peas, leafy green vegetables, and organ meats (liver, kidney). What side effects may I notice from receiving this medication? Side effects that you should report to your care team as soon as possible: Allergic reactions--skin rash, itching, hives, swelling of the face, lips, tongue, or throat Low blood pressure--dizziness, feeling faint or lightheaded, blurry vision Shortness of breath Side effects that usually do not require medical attention (report to your care team if they continue or are bothersome): Flushing Headache Joint pain Muscle pain Nausea Pain, redness, or irritation at injection site This list may not describe all possible side effects. Call your doctor for medical advice about side effects. You may report side effects to FDA at 1-800-FDA-1088. Where should I keep my medication? This medication is given in a hospital or clinic. It will not be stored at home. NOTE: This sheet is a summary. It may not cover all possible information. If you have questions about this medicine, talk to your doctor, pharmacist, or health care provider.  2024 Elsevier/Gold Standard (2022-06-27 00:00:00)

## 2022-10-29 ENCOUNTER — Encounter: Payer: Self-pay | Admitting: Oncology

## 2022-10-29 MED FILL — Iron Sucrose Inj 20 MG/ML (Fe Equiv): INTRAVENOUS | Qty: 10 | Status: AC

## 2022-10-30 ENCOUNTER — Inpatient Hospital Stay: Payer: Medicare HMO

## 2022-10-30 VITALS — BP 163/69 | HR 69 | Temp 97.9°F | Resp 18 | Ht 71.0 in | Wt 284.0 lb

## 2022-10-30 DIAGNOSIS — Z8249 Family history of ischemic heart disease and other diseases of the circulatory system: Secondary | ICD-10-CM | POA: Diagnosis not present

## 2022-10-30 DIAGNOSIS — Z800 Family history of malignant neoplasm of digestive organs: Secondary | ICD-10-CM | POA: Diagnosis not present

## 2022-10-30 DIAGNOSIS — Z823 Family history of stroke: Secondary | ICD-10-CM | POA: Diagnosis not present

## 2022-10-30 DIAGNOSIS — Z8673 Personal history of transient ischemic attack (TIA), and cerebral infarction without residual deficits: Secondary | ICD-10-CM | POA: Diagnosis not present

## 2022-10-30 DIAGNOSIS — Z23 Encounter for immunization: Secondary | ICD-10-CM | POA: Diagnosis not present

## 2022-10-30 DIAGNOSIS — Z79624 Long term (current) use of inhibitors of nucleotide synthesis: Secondary | ICD-10-CM | POA: Diagnosis not present

## 2022-10-30 DIAGNOSIS — Z811 Family history of alcohol abuse and dependence: Secondary | ICD-10-CM | POA: Diagnosis not present

## 2022-10-30 DIAGNOSIS — Z7902 Long term (current) use of antithrombotics/antiplatelets: Secondary | ICD-10-CM | POA: Diagnosis not present

## 2022-10-30 DIAGNOSIS — Z8719 Personal history of other diseases of the digestive system: Secondary | ICD-10-CM | POA: Diagnosis not present

## 2022-10-30 DIAGNOSIS — Z9089 Acquired absence of other organs: Secondary | ICD-10-CM | POA: Diagnosis not present

## 2022-10-30 DIAGNOSIS — D509 Iron deficiency anemia, unspecified: Secondary | ICD-10-CM | POA: Diagnosis not present

## 2022-10-30 DIAGNOSIS — Z833 Family history of diabetes mellitus: Secondary | ICD-10-CM | POA: Diagnosis not present

## 2022-10-30 DIAGNOSIS — Z79899 Other long term (current) drug therapy: Secondary | ICD-10-CM | POA: Diagnosis not present

## 2022-10-30 DIAGNOSIS — I2510 Atherosclerotic heart disease of native coronary artery without angina pectoris: Secondary | ICD-10-CM | POA: Diagnosis not present

## 2022-10-30 DIAGNOSIS — K644 Residual hemorrhoidal skin tags: Secondary | ICD-10-CM | POA: Diagnosis not present

## 2022-10-30 DIAGNOSIS — Z87891 Personal history of nicotine dependence: Secondary | ICD-10-CM | POA: Diagnosis not present

## 2022-10-30 DIAGNOSIS — E119 Type 2 diabetes mellitus without complications: Secondary | ICD-10-CM | POA: Diagnosis not present

## 2022-10-30 DIAGNOSIS — K269 Duodenal ulcer, unspecified as acute or chronic, without hemorrhage or perforation: Secondary | ICD-10-CM | POA: Diagnosis not present

## 2022-10-30 DIAGNOSIS — I10 Essential (primary) hypertension: Secondary | ICD-10-CM | POA: Diagnosis not present

## 2022-10-30 DIAGNOSIS — Z801 Family history of malignant neoplasm of trachea, bronchus and lung: Secondary | ICD-10-CM | POA: Diagnosis not present

## 2022-10-30 DIAGNOSIS — K5900 Constipation, unspecified: Secondary | ICD-10-CM | POA: Diagnosis not present

## 2022-10-30 DIAGNOSIS — D500 Iron deficiency anemia secondary to blood loss (chronic): Secondary | ICD-10-CM

## 2022-10-30 DIAGNOSIS — Z880 Allergy status to penicillin: Secondary | ICD-10-CM | POA: Diagnosis not present

## 2022-10-30 MED ORDER — SODIUM CHLORIDE 0.9 % IV SOLN
200.0000 mg | Freq: Once | INTRAVENOUS | Status: AC
  Administered 2022-10-30: 200 mg via INTRAVENOUS
  Filled 2022-10-30: qty 200

## 2022-10-30 MED ORDER — SODIUM CHLORIDE 0.9 % IV SOLN: Freq: Once | INTRAVENOUS | Status: AC

## 2022-10-30 NOTE — Patient Instructions (Signed)
Iron Sucrose Injection What is this medication? IRON SUCROSE (EYE ern SOO krose) treats low levels of iron (iron deficiency anemia) in people with kidney disease. Iron is a mineral that plays an important role in making red blood cells, which carry oxygen from your lungs to the rest of your body. This medicine may be used for other purposes; ask your health care provider or pharmacist if you have questions. COMMON BRAND NAME(S): Venofer What should I tell my care team before I take this medication? They need to know if you have any of these conditions: Anemia not caused by low iron levels Heart disease High levels of iron in the blood Kidney disease Liver disease An unusual or allergic reaction to iron, other medications, foods, dyes, or preservatives Pregnant or trying to get pregnant Breastfeeding How should I use this medication? This medication is for infusion into a vein. It is given in a hospital or clinic setting. Talk to your care team about the use of this medication in children. While this medication may be prescribed for children as young as 2 years for selected conditions, precautions do apply. Overdosage: If you think you have taken too much of this medicine contact a poison control center or emergency room at once. NOTE: This medicine is only for you. Do not share this medicine with others. What if I miss a dose? Keep appointments for follow-up doses. It is important not to miss your dose. Call your care team if you are unable to keep an appointment. What may interact with this medication? Do not take this medication with any of the following: Deferoxamine Dimercaprol Other iron products This medication may also interact with the following: Chloramphenicol Deferasirox This list may not describe all possible interactions. Give your health care provider a list of all the medicines, herbs, non-prescription drugs, or dietary supplements you use. Also tell them if you smoke,  drink alcohol, or use illegal drugs. Some items may interact with your medicine. What should I watch for while using this medication? Visit your care team regularly. Tell your care team if your symptoms do not start to get better or if they get worse. You may need blood work done while you are taking this medication. You may need to follow a special diet. Talk to your care team. Foods that contain iron include: whole grains/cereals, dried fruits, beans, or peas, leafy green vegetables, and organ meats (liver, kidney). What side effects may I notice from receiving this medication? Side effects that you should report to your care team as soon as possible: Allergic reactions--skin rash, itching, hives, swelling of the face, lips, tongue, or throat Low blood pressure--dizziness, feeling faint or lightheaded, blurry vision Shortness of breath Side effects that usually do not require medical attention (report to your care team if they continue or are bothersome): Flushing Headache Joint pain Muscle pain Nausea Pain, redness, or irritation at injection site This list may not describe all possible side effects. Call your doctor for medical advice about side effects. You may report side effects to FDA at 1-800-FDA-1088. Where should I keep my medication? This medication is given in a hospital or clinic. It will not be stored at home. NOTE: This sheet is a summary. It may not cover all possible information. If you have questions about this medicine, talk to your doctor, pharmacist, or health care provider.  2024 Elsevier/Gold Standard (2022-06-27 00:00:00)

## 2022-10-31 ENCOUNTER — Encounter: Payer: Self-pay | Admitting: Oncology

## 2022-10-31 MED FILL — Iron Sucrose Inj 20 MG/ML (Fe Equiv): INTRAVENOUS | Qty: 10 | Status: AC

## 2022-11-03 ENCOUNTER — Inpatient Hospital Stay: Payer: Medicare HMO

## 2022-11-03 VITALS — BP 137/63 | HR 81 | Temp 97.5°F | Resp 16 | Wt 280.0 lb

## 2022-11-03 DIAGNOSIS — Z800 Family history of malignant neoplasm of digestive organs: Secondary | ICD-10-CM | POA: Diagnosis not present

## 2022-11-03 DIAGNOSIS — E119 Type 2 diabetes mellitus without complications: Secondary | ICD-10-CM | POA: Diagnosis not present

## 2022-11-03 DIAGNOSIS — Z87891 Personal history of nicotine dependence: Secondary | ICD-10-CM | POA: Diagnosis not present

## 2022-11-03 DIAGNOSIS — Z7902 Long term (current) use of antithrombotics/antiplatelets: Secondary | ICD-10-CM | POA: Diagnosis not present

## 2022-11-03 DIAGNOSIS — Z833 Family history of diabetes mellitus: Secondary | ICD-10-CM | POA: Diagnosis not present

## 2022-11-03 DIAGNOSIS — Z9089 Acquired absence of other organs: Secondary | ICD-10-CM | POA: Diagnosis not present

## 2022-11-03 DIAGNOSIS — K644 Residual hemorrhoidal skin tags: Secondary | ICD-10-CM | POA: Diagnosis not present

## 2022-11-03 DIAGNOSIS — D509 Iron deficiency anemia, unspecified: Secondary | ICD-10-CM | POA: Diagnosis not present

## 2022-11-03 DIAGNOSIS — Z8673 Personal history of transient ischemic attack (TIA), and cerebral infarction without residual deficits: Secondary | ICD-10-CM | POA: Diagnosis not present

## 2022-11-03 DIAGNOSIS — Z79899 Other long term (current) drug therapy: Secondary | ICD-10-CM | POA: Diagnosis not present

## 2022-11-03 DIAGNOSIS — D500 Iron deficiency anemia secondary to blood loss (chronic): Secondary | ICD-10-CM

## 2022-11-03 DIAGNOSIS — K5900 Constipation, unspecified: Secondary | ICD-10-CM | POA: Diagnosis not present

## 2022-11-03 DIAGNOSIS — Z801 Family history of malignant neoplasm of trachea, bronchus and lung: Secondary | ICD-10-CM | POA: Diagnosis not present

## 2022-11-03 DIAGNOSIS — Z880 Allergy status to penicillin: Secondary | ICD-10-CM | POA: Diagnosis not present

## 2022-11-03 DIAGNOSIS — Z8719 Personal history of other diseases of the digestive system: Secondary | ICD-10-CM | POA: Diagnosis not present

## 2022-11-03 DIAGNOSIS — Z79624 Long term (current) use of inhibitors of nucleotide synthesis: Secondary | ICD-10-CM | POA: Diagnosis not present

## 2022-11-03 DIAGNOSIS — I2510 Atherosclerotic heart disease of native coronary artery without angina pectoris: Secondary | ICD-10-CM | POA: Diagnosis not present

## 2022-11-03 DIAGNOSIS — K269 Duodenal ulcer, unspecified as acute or chronic, without hemorrhage or perforation: Secondary | ICD-10-CM | POA: Diagnosis not present

## 2022-11-03 DIAGNOSIS — I10 Essential (primary) hypertension: Secondary | ICD-10-CM | POA: Diagnosis not present

## 2022-11-03 DIAGNOSIS — Z23 Encounter for immunization: Secondary | ICD-10-CM | POA: Diagnosis not present

## 2022-11-03 DIAGNOSIS — Z8249 Family history of ischemic heart disease and other diseases of the circulatory system: Secondary | ICD-10-CM | POA: Diagnosis not present

## 2022-11-03 DIAGNOSIS — Z823 Family history of stroke: Secondary | ICD-10-CM | POA: Diagnosis not present

## 2022-11-03 DIAGNOSIS — Z811 Family history of alcohol abuse and dependence: Secondary | ICD-10-CM | POA: Diagnosis not present

## 2022-11-03 MED ORDER — INFLUENZA VAC A&B SURF ANT ADJ 0.5 ML IM SUSY
0.5000 mL | PREFILLED_SYRINGE | Freq: Once | INTRAMUSCULAR | Status: AC
  Administered 2022-11-03: 0.5 mL via INTRAMUSCULAR
  Filled 2022-11-03: qty 0.5

## 2022-11-03 MED ORDER — SODIUM CHLORIDE 0.9 % IV SOLN
200.0000 mg | Freq: Once | INTRAVENOUS | Status: AC
  Administered 2022-11-03: 200 mg via INTRAVENOUS
  Filled 2022-11-03: qty 200

## 2022-11-03 MED ORDER — SODIUM CHLORIDE 0.9 % IV SOLN: Freq: Once | INTRAVENOUS | Status: AC

## 2022-11-03 NOTE — Patient Instructions (Signed)
Iron Sucrose Injection What is this medication? IRON SUCROSE (EYE ern SOO krose) treats low levels of iron (iron deficiency anemia) in people with kidney disease. Iron is a mineral that plays an important role in making red blood cells, which carry oxygen from your lungs to the rest of your body. This medicine may be used for other purposes; ask your health care provider or pharmacist if you have questions. COMMON BRAND NAME(S): Venofer What should I tell my care team before I take this medication? They need to know if you have any of these conditions: Anemia not caused by low iron levels Heart disease High levels of iron in the blood Kidney disease Liver disease An unusual or allergic reaction to iron, other medications, foods, dyes, or preservatives Pregnant or trying to get pregnant Breastfeeding How should I use this medication? This medication is for infusion into a vein. It is given in a hospital or clinic setting. Talk to your care team about the use of this medication in children. While this medication may be prescribed for children as young as 2 years for selected conditions, precautions do apply. Overdosage: If you think you have taken too much of this medicine contact a poison control center or emergency room at once. NOTE: This medicine is only for you. Do not share this medicine with others. What if I miss a dose? Keep appointments for follow-up doses. It is important not to miss your dose. Call your care team if you are unable to keep an appointment. What may interact with this medication? Do not take this medication with any of the following: Deferoxamine Dimercaprol Other iron products This medication may also interact with the following: Chloramphenicol Deferasirox This list may not describe all possible interactions. Give your health care provider a list of all the medicines, herbs, non-prescription drugs, or dietary supplements you use. Also tell them if you smoke,  drink alcohol, or use illegal drugs. Some items may interact with your medicine. What should I watch for while using this medication? Visit your care team regularly. Tell your care team if your symptoms do not start to get better or if they get worse. You may need blood work done while you are taking this medication. You may need to follow a special diet. Talk to your care team. Foods that contain iron include: whole grains/cereals, dried fruits, beans, or peas, leafy green vegetables, and organ meats (liver, kidney). What side effects may I notice from receiving this medication? Side effects that you should report to your care team as soon as possible: Allergic reactions--skin rash, itching, hives, swelling of the face, lips, tongue, or throat Low blood pressure--dizziness, feeling faint or lightheaded, blurry vision Shortness of breath Side effects that usually do not require medical attention (report to your care team if they continue or are bothersome): Flushing Headache Joint pain Muscle pain Nausea Pain, redness, or irritation at injection site This list may not describe all possible side effects. Call your doctor for medical advice about side effects. You may report side effects to FDA at 1-800-FDA-1088. Where should I keep my medication? This medication is given in a hospital or clinic. It will not be stored at home. NOTE: This sheet is a summary. It may not cover all possible information. If you have questions about this medicine, talk to your doctor, pharmacist, or health care provider.  2024 Elsevier/Gold Standard (2022-06-27 00:00:00)

## 2022-11-07 DIAGNOSIS — R2681 Unsteadiness on feet: Secondary | ICD-10-CM | POA: Diagnosis not present

## 2022-11-07 DIAGNOSIS — I69354 Hemiplegia and hemiparesis following cerebral infarction affecting left non-dominant side: Secondary | ICD-10-CM | POA: Diagnosis not present

## 2022-11-07 DIAGNOSIS — E1142 Type 2 diabetes mellitus with diabetic polyneuropathy: Secondary | ICD-10-CM | POA: Diagnosis not present

## 2022-12-05 DIAGNOSIS — B9689 Other specified bacterial agents as the cause of diseases classified elsewhere: Secondary | ICD-10-CM | POA: Diagnosis not present

## 2022-12-05 DIAGNOSIS — J208 Acute bronchitis due to other specified organisms: Secondary | ICD-10-CM | POA: Diagnosis not present

## 2022-12-26 ENCOUNTER — Ambulatory Visit: Payer: Medicare HMO | Admitting: Gastroenterology

## 2023-01-07 NOTE — Progress Notes (Unsigned)
[facility]  [address] [place],  [place]  [address] [phone]  Clinic Day:  01/07/2023  Referring physician: Paulina Fusi, MD   HISTORY OF PRESENT ILLNESS:  The patient is a 72 y.o. male  who I was asked to consult upon for iron deficiency anemia.  Recent labs showed a low hemoglobin of 11.6, with an MCV of 86.  Iron studies done recently showed a low ferritin of 16.7, a serum iron of 48, an elevated TIBC of 595, and a low iron saturation of 8.1%.  According to the patient, he denies having any overt forms of blood loss recently.  In the past, he has had occasional blood in his stool, which he presumed to be due to his known hemorrhoids.  He has tried to take iron pills consistently on a daily basis, but it caused him to be constipated.  He also does not like that they cause black stools.  Outside records show that he had both an EGD and colonoscopy back in June 2021.  His EGD revealed moderate gastritis, as well as a single nonbleeding angiodysplastic lesion in his stomach.  It also showed multiple duodenal ulcers.  His colonoscopy done at the same time showed 1 small polyp in his distal sigmoid colon.  There were 2 small AVMs appreciated.  Furthermore, nonbleeding external and internal hemorrhoids were appreciated.  To his knowledge, there is no family history of either anemia or colorectal cancer.  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) 10/13/2019   DDD (degenerative disc disease), cervical    Depression    Dyslipidemia 05/29/2017   Essential hypertension 05/29/2017   Generalized osteoarthritis of  multiple sites    Gout    Hyperlipidemia    Hypertension, benign    Laryngopharyngeal reflux    Morbid obesity (HCC) 11/26/2018   Shortness of breath 07/17/2017   Sinus arrhythmia 11/26/2018   Stroke (HCC) 08/2022   [facility] hospitalized   Swelling 05/29/2017   Tobacco abuse    Type 2 diabetes mellitus without complication, without long-term current use of insulin (HCC) 05/29/2017   Uncontrolled type 2 diabetes mellitus with peripheral neuropathy     PAST SURGICAL HISTORY:   Past Surgical History:  Procedure Laterality Date   HERNIA REPAIR  1998   Abdominal   LEFT HEART CATH AND CORONARY ANGIOGRAPHY N/A 10/13/2019   Procedure: LEFT HEART CATH AND CORONARY ANGIOGRAPHY;  Surgeon: Yvonne Kendall, MD;  Location: [facility];  Service: Cardiovascular;  Laterality: N/A;   MINOR HEMORRHOIDECTOMY  1998   NASAL SINUS SURGERY  1978   SHOULDER SURGERY Left    X2   TONSILLECTOMY  1967    CURRENT MEDICATIONS:   Current Outpatient Medications  Medication Sig Dispense Refill   allopurinol (ZYLOPRIM) 300 MG tablet Take 300 mg by mouth daily.     amLODipine (NORVASC) 5 MG tablet Take 5 mg by mouth daily.      aspirin EC 81 MG tablet Take 81 mg by mouth daily. Swallow whole.     atorvastatin (LIPITOR) 80 MG tablet Take 80 mg by mouth at bedtime.  BD PEN NEEDLE NANO 2ND GEN 32G X 4 MM MISC      cetirizine (ZYRTEC) 10 MG tablet Take 10 mg by mouth daily.      citalopram (CELEXA) 20 MG tablet Take 20 mg by mouth daily.     clopidogrel (PLAVIX) 75 MG tablet Take 75 mg by mouth daily.     docusate sodium (COLACE) 100 MG capsule Take 100 mg by mouth daily.     doxycycline (VIBRA-TABS) 100 MG tablet Take 100 mg by mouth 2 (two) times daily. (Patient not taking: Reported on 10/13/2022)     FEROSUL 325 (65 Fe) MG tablet TAKE 1 TABLET EVERY OTHER DAY 45 tablet 2   finasteride (PROSCAR) 5 MG tablet Take 5 mg by mouth daily.     furosemide (LASIX) 20 MG tablet Take 20 mg by mouth daily.      gabapentin (NEURONTIN) 300 MG capsule Take 2 pills (600 mg) in the morning and 3 pills (900 mg) at bedtime 150 capsule 6   gemfibrozil (LOPID) 600 MG tablet Take 600 mg by mouth 2 (two) times daily before a meal.     glimepiride (AMARYL) 4 MG tablet Take 4 mg by mouth 2 (two) times daily. (Patient not taking: Reported on 10/13/2022)     isosorbide mononitrate (IMDUR) 30 MG 24 hr tablet Take 30 mg by mouth daily.     losartan (COZAAR) 100 MG tablet Take 100 mg by mouth at bedtime.      meloxicam (MOBIC) 7.5 MG tablet Take 7.5 mg by mouth 2 (two) times daily.     metFORMIN (GLUCOPHAGE) 1000 MG tablet Take 1 tablet (1,000 mg total) by mouth 2 (two) times daily with a meal.     metoprolol succinate (TOPROL-XL) 25 MG 24 hr tablet Take 12.5 mg by mouth daily.     montelukast (SINGULAIR) 10 MG tablet Take 10 mg by mouth daily.      naphazoline-pheniramine (NAPHCON-A) 0.025-0.3 % ophthalmic solution Place 1 drop into both eyes 3 (three) times daily as needed for eye irritation (dry/irritated/allergy eyes.).      nitroGLYCERIN (NITROSTAT) 0.4 MG SL tablet Place 0.4 mg under the tongue every 5 (five) minutes as needed for chest pain.     NOVOLOG FLEXPEN 100 UNIT/ML FlexPen Inject 20 Units into the skin in the morning, at noon, and at bedtime.      Omega-3 Fatty Acids (OMEGA 3 PO) Take 2 capsules by mouth daily. Omega XL     omeprazole (PRILOSEC) 40 MG capsule Take 40 mg by mouth at bedtime.     pantoprazole (PROTONIX) 40 MG tablet Take 40 mg by mouth daily.     potassium chloride SA (KLOR-CON M) 20 MEQ tablet Take 20 mEq by mouth daily.     tamsulosin (FLOMAX) 0.4 MG CAPS capsule Take 0.4 mg by mouth daily.     traMADol-acetaminophen (ULTRACET) 37.5-325 MG tablet Take 1 tablet by mouth as needed for moderate pain or severe pain. (Patient not taking: Reported on 10/13/2022)     TRESIBA FLEXTOUCH 100 UNIT/ML FlexTouch Pen Inject 60 Units into the skin daily.  (Patient not taking: Reported on 10/13/2022)      valACYclovir (VALTREX) 1000 MG tablet Take 2,000 mg by mouth 2 (two) times daily. (Patient not taking: Reported on 10/13/2022)     No current facility-administered medications for this visit.    ALLERGIES:   Allergies  Allergen Reactions   Pseudoephedrine Other (See Comments)   Penicillins Rash   Tetracyclines & Related  Rash    FAMILY HISTORY:   Family History  Problem Relation Age of Onset   Heart disease Mother    Heart attack Mother    Stroke Father    Lung cancer Father    Alcohol abuse Father    Diabetes Brother    Stomach cancer Maternal Grandmother    Stomach cancer Paternal Grandmother    Colon cancer Neg Hx    Esophageal cancer Neg Hx    Rectal cancer Neg Hx     SOCIAL HISTORY:  The patient was born and raised in [place].  He currently lives in the [place] community.  He is widowed; he was previously married for 46 years.  He has 2 children and 3 grandchildren.  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  The patient smoked as much as 1-2 packs of cigarettes daily for 40 years before quitting 10 years ago.  REVIEW OF SYSTEMS:  Review of Systems  Constitutional:  Positive for fatigue. Negative for fever and unexpected weight change.  Respiratory:  Positive for shortness of breath. Negative for chest tightness, cough and hemoptysis.   Cardiovascular:  Positive for chest pain. Negative for palpitations.  Gastrointestinal:  Positive for constipation. Negative for abdominal distention, abdominal pain, blood in stool, diarrhea, nausea and vomiting.  Genitourinary:  Negative for dysuria, frequency and hematuria.   Musculoskeletal:  Positive for arthralgias and gait problem. Negative for back pain and myalgias.  Skin:  Negative for itching and rash.  Neurological:  Positive for gait problem. Negative for dizziness, headaches and light-headedness.  Psychiatric/Behavioral:  Negative for  depression and suicidal ideas. The patient is not nervous/anxious.      PHYSICAL EXAM:  There were no vitals taken for this visit. Wt Readings from Last 3 Encounters:  11/03/22 280 lb (127 kg)  10/30/22 284 lb (128.8 kg)  10/27/22 279 lb 0.6 oz (126.6 kg)   There is no height or weight on file to calculate BMI. Performance status (ECOG): 2 - Symptomatic, <50% confined to bed Physical Exam Constitutional:      Appearance: Normal appearance. He is obese. He is not ill-appearing.  HENT:     Mouth/Throat:     Mouth: Mucous membranes are moist.     Pharynx: Oropharynx is clear. No oropharyngeal exudate or posterior oropharyngeal erythema.  Cardiovascular:     Rate and Rhythm: Normal rate and regular rhythm.     Heart sounds: No murmur heard.    No friction rub. No gallop.  Pulmonary:     Effort: Pulmonary effort is normal. No respiratory distress.     Breath sounds: Normal breath sounds. No wheezing, rhonchi or rales.  Abdominal:     General: Bowel sounds are normal. There is no distension.     Palpations: Abdomen is soft. There is no mass.     Tenderness: There is no abdominal tenderness.  Musculoskeletal:        General: No swelling.     Right lower leg: No edema.     Left lower leg: No edema.  Lymphadenopathy:     Cervical: No cervical adenopathy.     Upper Body:     Right upper body: No supraclavicular or axillary adenopathy.     Left upper body: No supraclavicular or axillary adenopathy.     Lower Body: No right inguinal adenopathy. No left inguinal adenopathy.  Skin:    General: Skin is  warm.     Coloration: Skin is not jaundiced.     Findings: No lesion or rash.  Neurological:     General: No focal deficit present.     Mental Status: He is alert and oriented to person, place, and time. Mental status is at baseline.  Psychiatric:        Mood and Affect: Mood normal.        Behavior: Behavior normal.        Thought Content: Thought content normal.     LABS:       Latest Ref Rng & Units 10/09/2022   12:00 AM 09/25/2022   11:55 AM 07/26/2020    2:28 PM  CBC  WBC  8.3     8.7  9.5   Hemoglobin 13.5 - 17.5 11.7     11.6  10.2   Hematocrit 41 - 53 36     35.8  33.9   Platelets 150 - 400 K/uL 307     305.0  362      This result is from an external source.      Latest Ref Rng & Units 09/25/2022   11:55 AM 07/26/2020    2:28 PM 01/05/2020   11:20 AM  CMP  Glucose 70 - 99 mg/dL 098  119  147   BUN 6 - 23 mg/dL 24  18  20    Creatinine 0.40 - 1.50 mg/dL 8.29  5.62  1.30   Sodium 135 - 145 mEq/L 136  139  137   Potassium 3.5 - 5.1 mEq/L 3.8  4.7  4.2   Chloride 96 - 112 mEq/L 97  100  98   CO2 19 - 32 mEq/L 29  22  24    Calcium 8.4 - 10.5 mg/dL 9.8  9.9  86.5   Total Protein 6.0 - 8.3 g/dL 8.0     Total Bilirubin 0.2 - 1.2 mg/dL 0.3     Alkaline Phos 39 - 117 U/L 67     AST 0 - 37 U/L 48     ALT 0 - 53 U/L 37      ASSESSMENT & PLAN:  A 72 y.o. male who I was asked to consult upon for iron deficiency anemia.  Based upon his EGD and colonoscopy done 3 years ago, my concern would be that some type of arteriovenous malformation may be leaking to where gradual blood loss and resultant iron deficiency are developing.  Ultimately, it would take another GI workup to prove this.  For now, I will arrange for him to receive IV iron over these next few weeks to rapidly replenish his iron stores and normalize his hemoglobin.  I will see him back in 3 months to reassess his iron and hemoglobin levels to see how well he responded to his upcoming IV iron.  The patient understands all the plans discussed today and is in agreement with them.  I do appreciate Paulina Fusi, MD for his new consult.   Haruko Mersch Kirby Funk, MD

## 2023-01-08 ENCOUNTER — Inpatient Hospital Stay: Payer: Medicare HMO | Attending: Oncology | Admitting: Oncology

## 2023-01-08 ENCOUNTER — Inpatient Hospital Stay: Payer: Medicare HMO

## 2023-01-08 ENCOUNTER — Telehealth: Payer: Self-pay | Admitting: Oncology

## 2023-01-08 DIAGNOSIS — R600 Localized edema: Secondary | ICD-10-CM | POA: Diagnosis not present

## 2023-01-08 DIAGNOSIS — I25118 Atherosclerotic heart disease of native coronary artery with other forms of angina pectoris: Secondary | ICD-10-CM | POA: Diagnosis not present

## 2023-01-08 DIAGNOSIS — E1142 Type 2 diabetes mellitus with diabetic polyneuropathy: Secondary | ICD-10-CM | POA: Diagnosis not present

## 2023-01-08 DIAGNOSIS — I10 Essential (primary) hypertension: Secondary | ICD-10-CM | POA: Diagnosis not present

## 2023-01-08 DIAGNOSIS — N401 Enlarged prostate with lower urinary tract symptoms: Secondary | ICD-10-CM | POA: Diagnosis not present

## 2023-01-08 DIAGNOSIS — E785 Hyperlipidemia, unspecified: Secondary | ICD-10-CM | POA: Diagnosis not present

## 2023-01-08 DIAGNOSIS — I69354 Hemiplegia and hemiparesis following cerebral infarction affecting left non-dominant side: Secondary | ICD-10-CM | POA: Diagnosis not present

## 2023-01-08 DIAGNOSIS — D509 Iron deficiency anemia, unspecified: Secondary | ICD-10-CM | POA: Diagnosis not present

## 2023-01-08 NOTE — Telephone Encounter (Signed)
01/08/2023  Patient missed his Appt's this morning - Unable to Leave Msg (VM Full).

## 2023-01-23 NOTE — Telephone Encounter (Signed)
Patient has been scheduled. Aware of appt date and time  

## 2023-01-29 DIAGNOSIS — B9689 Other specified bacterial agents as the cause of diseases classified elsewhere: Secondary | ICD-10-CM | POA: Diagnosis not present

## 2023-01-29 DIAGNOSIS — J019 Acute sinusitis, unspecified: Secondary | ICD-10-CM | POA: Diagnosis not present

## 2023-02-08 NOTE — Progress Notes (Signed)
 [facility]  [address] [place],  [place]  [address] [phone]  Clinic Day:  02/09/2023  Referring physician: Keren Vicenta BRAVO, MD   HISTORY OF PRESENT ILLNESS:  The patient is a 73 y.o. male  who I recently began seeing for iron  deficiency anemia.  He comes in today to reassess his labs after receiving IV iron .  Since his last visit, the patient has been doing okay.  He denies having any overt forms of blood loss recently.  He still takes an iron  pill on a daily basis, but it still causes him constipated.    PHYSICAL EXAM:  Blood pressure (!) 157/117, pulse 74, temperature 98.1 F (36.7 C), temperature source Oral, resp. rate 16, height 5' 11 (1.803 m), weight 286 lb 3.2 oz (129.8 kg), SpO2 98%. Wt Readings from Last 3 Encounters:  02/09/23 286 lb 3.2 oz (129.8 kg)  11/03/22 280 lb (127 kg)  10/30/22 284 lb (128.8 kg)   Body mass index is 39.92 kg/m. Performance status (ECOG): 2 - Symptomatic, <50% confined to bed Physical Exam Constitutional:      Appearance: Normal appearance. He is obese. He is not ill-appearing.  HENT:     Mouth/Throat:     Mouth: Mucous membranes are moist.     Pharynx: Oropharynx is clear. No oropharyngeal exudate or posterior oropharyngeal erythema.  Cardiovascular:     Rate and Rhythm: Normal rate and regular rhythm.     Heart sounds: No murmur heard.    No friction rub. No gallop.  Pulmonary:     Effort: Pulmonary effort is normal. No respiratory distress.     Breath sounds: Normal breath sounds. No wheezing, rhonchi or rales.  Abdominal:     General: Bowel sounds are normal. There is no distension.     Palpations: Abdomen is soft. There is no mass.     Tenderness: There is no abdominal tenderness.  Musculoskeletal:        General: No swelling.     Right lower leg: No edema.     Left lower leg: No edema.  Lymphadenopathy:     Cervical: No cervical adenopathy.     Upper Body:     Right upper body: No  supraclavicular or axillary adenopathy.     Left upper body: No supraclavicular or axillary adenopathy.     Lower Body: No right inguinal adenopathy. No left inguinal adenopathy.  Skin:    General: Skin is warm.     Coloration: Skin is not jaundiced.     Findings: No lesion or rash.  Neurological:     General: No focal deficit present.     Mental Status: He is alert and oriented to person, place, and time. Mental status is at baseline.  Psychiatric:        Mood and Affect: Mood normal.        Behavior: Behavior normal.        Thought Content: Thought content normal.     LABS:       Latest Ref Rng & Units 02/09/2023   10:25 AM 10/09/2022   12:00 AM 09/25/2022   11:55 AM  CBC  WBC 4.0 - 10.5 K/uL 8.1  8.3     8.7   Hemoglobin 13.0 - 17.0 g/dL 87.9  88.2     88.3   Hematocrit 39.0 - 52.0 % 36.5  36     35.8   Platelets 150 - 400 K/uL 291  307  305.0      This result is from an external source.      Latest Ref Rng & Units 09/25/2022   11:55 AM 07/26/2020    2:28 PM 01/05/2020   11:20 AM  CMP  Glucose 70 - 99 mg/dL 869  793  830   BUN 6 - 23 mg/dL 24  18  20    Creatinine 0.40 - 1.50 mg/dL 8.96  9.12  9.09   Sodium 135 - 145 mEq/L 136  139  137   Potassium 3.5 - 5.1 mEq/L 3.8  4.7  4.2   Chloride 96 - 112 mEq/L 97  100  98   CO2 19 - 32 mEq/L 29  22  24    Calcium  8.4 - 10.5 mg/dL 9.8  9.9  89.5   Total Protein 6.0 - 8.3 g/dL 8.0     Total Bilirubin 0.2 - 1.2 mg/dL 0.3     Alkaline Phos 39 - 117 U/L 67     AST 0 - 37 U/L 48     ALT 0 - 53 U/L 37       Latest Reference Range & Units 02/09/23 10:24  Iron  45 - 182 ug/dL 48  UIBC ug/dL 528  TIBC 749 - 549 ug/dL 480 (H)  Saturation Ratios 17.9 - 39.5 % 9 (L)  Ferritin 24 - 336 ng/mL 16 (L)  (H): Data is abnormally high (L): Data is abnormally low  ASSESSMENT & PLAN:  A 73 y.o. male with iron  deficiency anemia.  Once again, his labs show persistent iron  deficiency.   Based upon this, I will arrange for him to receive IV  iron  over these next few weeks to rapidly replenish his iron  stores and normalize his hemoglobin.  I also believe he needs a repeat GI workup to ensure there is no adverse GI process present.  I will see him back in 4 months to reassess his iron  and hemoglobin levels to see how well he responded to his upcoming IV iron .  The patient understands all the plans discussed today and is in agreement with them.  Jenay Morici DELENA Kerns, MD

## 2023-02-09 ENCOUNTER — Inpatient Hospital Stay: Payer: Medicare HMO | Attending: Oncology

## 2023-02-09 ENCOUNTER — Telehealth: Payer: Self-pay | Admitting: Oncology

## 2023-02-09 ENCOUNTER — Inpatient Hospital Stay: Payer: Medicare HMO | Admitting: Oncology

## 2023-02-09 ENCOUNTER — Other Ambulatory Visit: Payer: Self-pay | Admitting: Oncology

## 2023-02-09 VITALS — BP 157/117 | HR 74 | Temp 98.1°F | Resp 16 | Ht 71.0 in | Wt 286.2 lb

## 2023-02-09 DIAGNOSIS — D500 Iron deficiency anemia secondary to blood loss (chronic): Secondary | ICD-10-CM | POA: Diagnosis not present

## 2023-02-09 DIAGNOSIS — Z79899 Other long term (current) drug therapy: Secondary | ICD-10-CM | POA: Insufficient documentation

## 2023-02-09 DIAGNOSIS — D509 Iron deficiency anemia, unspecified: Secondary | ICD-10-CM | POA: Insufficient documentation

## 2023-02-09 DIAGNOSIS — K5900 Constipation, unspecified: Secondary | ICD-10-CM | POA: Diagnosis not present

## 2023-02-09 LAB — CBC WITH DIFFERENTIAL (CANCER CENTER ONLY)
Abs Immature Granulocytes: 0.02 10*3/uL (ref 0.00–0.07)
Basophils Absolute: 0 10*3/uL (ref 0.0–0.1)
Basophils Relative: 1 %
Eosinophils Absolute: 0.6 10*3/uL — ABNORMAL HIGH (ref 0.0–0.5)
Eosinophils Relative: 7 %
HCT: 36.5 % — ABNORMAL LOW (ref 39.0–52.0)
Hemoglobin: 12 g/dL — ABNORMAL LOW (ref 13.0–17.0)
Immature Granulocytes: 0 %
Lymphocytes Relative: 23 %
Lymphs Abs: 1.9 10*3/uL (ref 0.7–4.0)
MCH: 30.2 pg (ref 26.0–34.0)
MCHC: 32.9 g/dL (ref 30.0–36.0)
MCV: 91.9 fL (ref 80.0–100.0)
Monocytes Absolute: 0.7 10*3/uL (ref 0.1–1.0)
Monocytes Relative: 9 %
Neutro Abs: 4.9 10*3/uL (ref 1.7–7.7)
Neutrophils Relative %: 60 %
Platelet Count: 291 10*3/uL (ref 150–400)
RBC: 3.97 MIL/uL — ABNORMAL LOW (ref 4.22–5.81)
RDW: 14.3 % (ref 11.5–15.5)
WBC Count: 8.1 10*3/uL (ref 4.0–10.5)
nRBC: 0 % (ref 0.0–0.2)
nRBC: 0 /100{WBCs}

## 2023-02-09 LAB — IRON AND TIBC
Iron: 48 ug/dL (ref 45–182)
Saturation Ratios: 9 % — ABNORMAL LOW (ref 17.9–39.5)
TIBC: 519 ug/dL — ABNORMAL HIGH (ref 250–450)
UIBC: 471 ug/dL

## 2023-02-09 LAB — FERRITIN: Ferritin: 16 ng/mL — ABNORMAL LOW (ref 24–336)

## 2023-02-09 NOTE — Telephone Encounter (Signed)
 Patient has been scheduled for follow-up visit per 02/09/23 LOS.  Pt given an appt calendar with date and time.

## 2023-02-10 ENCOUNTER — Telehealth: Payer: Self-pay

## 2023-02-10 ENCOUNTER — Encounter: Payer: Self-pay | Admitting: Oncology

## 2023-02-10 NOTE — Telephone Encounter (Signed)
  Latest Reference Range & Units 02/09/23 10:24   Iron  45 - 182 ug/dL 48  UIBC ug/dL 528  TIBC 749 - 549 ug/dL 480 (H)  Saturation Ratios 17.9 - 39.5 % 9 (L)  Ferritin 24 - 336 ng/mL 16 (L)  (H): Data is abnormally high (L): Data is abnormally low   ASSESSMENT & PLAN:  A 73 y.o. male with iron  deficiency anemia.  Once again, his labs show persistent iron  deficiency.   Based upon this, I will arrange for him to receive IV iron  over these next few weeks to rapidly replenish his iron  stores and normalize his hemoglobin.  I also believe he needs a repeat GI workup to ensure there is no adverse GI process present.  I will see him back in 4 months to reassess his iron  and hemoglobin levels to see how well he responded to his upcoming IV iron .  The patient understands all the plans discussed today and is in agreement with them.   Valaria DELENA Kerns, MD                 Electronically signed by Kerns Valaria DELENA, MD at 02/09/2023  5:55 PM

## 2023-02-12 ENCOUNTER — Inpatient Hospital Stay: Payer: Medicare HMO

## 2023-02-12 VITALS — BP 155/91 | HR 79 | Temp 97.4°F | Resp 16

## 2023-02-12 DIAGNOSIS — Z79899 Other long term (current) drug therapy: Secondary | ICD-10-CM | POA: Diagnosis not present

## 2023-02-12 DIAGNOSIS — K5900 Constipation, unspecified: Secondary | ICD-10-CM | POA: Diagnosis not present

## 2023-02-12 DIAGNOSIS — D500 Iron deficiency anemia secondary to blood loss (chronic): Secondary | ICD-10-CM

## 2023-02-12 DIAGNOSIS — D509 Iron deficiency anemia, unspecified: Secondary | ICD-10-CM | POA: Diagnosis not present

## 2023-02-12 MED ORDER — IRON SUCROSE 20 MG/ML IV SOLN
200.0000 mg | Freq: Once | INTRAVENOUS | Status: AC
Start: 2023-02-12 — End: 2023-02-12
  Administered 2023-02-12: 200 mg via INTRAVENOUS
  Filled 2023-02-12: qty 10

## 2023-02-12 MED ORDER — SODIUM CHLORIDE 0.9 % IV SOLN
INTRAVENOUS | Status: DC
Start: 2023-02-12 — End: 2023-02-12

## 2023-02-12 NOTE — Patient Instructions (Addendum)
 Hypertension Hypertension is another name for high blood pressure. High blood pressure forces your heart to work harder to pump blood. This can cause problems over time. There are two numbers in a blood pressure reading. There is a top number (systolic) over a bottom number (diastolic). It is best to have a blood pressure that is below 120/80. What are the causes? The cause of this condition is not known. Some other conditions can lead to high blood pressure. What increases the risk? Some lifestyle factors can make you more likely to develop high blood pressure: Smoking. Not getting enough exercise or physical activity. Being overweight. Having too much fat, sugar, calories, or salt (sodium) in your diet. Drinking too much alcohol. Other risk factors include: Having any of these conditions: Heart disease. Diabetes. High cholesterol. Kidney disease. Obstructive sleep apnea. Having a family history of high blood pressure and high cholesterol. Age. The risk increases with age. Stress. What are the signs or symptoms? High blood pressure may not cause symptoms. Very high blood pressure (hypertensive crisis) may cause: Headache. Fast or uneven heartbeats (palpitations). Shortness of breath. Nosebleed. Vomiting or feeling like you may vomit (nauseous). Changes in how you see. Very bad chest pain. Feeling dizzy. Seizures. How is this treated? This condition is treated by making healthy lifestyle changes, such as: Eating healthy foods. Exercising more. Drinking less alcohol. Your doctor may prescribe medicine if lifestyle changes do not help enough and if: Your top number is above 130. Your bottom number is above 80. Your personal target blood pressure may vary. Follow these instructions at home: Eating and drinking  If told, follow the DASH eating plan. To follow this plan: Fill one half of your plate at each meal with fruits and vegetables. Fill one fourth of your plate at  each meal with whole grains. Whole grains include whole-wheat pasta, brown rice, and whole-grain bread. Eat or drink low-fat dairy products, such as skim milk or low-fat yogurt. Fill one fourth of your plate at each meal with low-fat (lean) proteins. Low-fat proteins include fish, chicken without skin, eggs, beans, and tofu. Avoid fatty meat, cured and processed meat, or chicken with skin. Avoid pre-made or processed food. Limit the amount of salt in your diet to less than 1,500 mg each day. Do not drink alcohol if: Your doctor tells you not to drink. You are pregnant, may be pregnant, or are planning to become pregnant. If you drink alcohol: Limit how much you have to: 0-1 drink a day for women. 0-2 drinks a day for men. Know how much alcohol is in your drink. In the [place] one drink equals one 12 oz bottle of beer (355 mL), one 5 oz glass of wine (148 mL), or one 1 oz glass of hard liquor (44 mL). Lifestyle  Work with your doctor to stay at a healthy weight or to lose weight. Ask your doctor what the best weight is for you. Get at least 30 minutes of exercise that causes your heart to beat faster (aerobic exercise) most days of the week. This may include walking, swimming, or biking. Get at least 30 minutes of exercise that strengthens your muscles (resistance exercise) at least 3 days a week. This may include lifting weights or doing Pilates. Do not smoke or use any products that contain nicotine or tobacco. If you need help quitting, ask your doctor. Check your blood pressure at home as told by your doctor. Keep all follow-up visits. Medicines Take over-the-counter and prescription medicines only  as told by your doctor. Follow directions carefully. Do not skip doses of blood pressure medicine. The medicine does not work as well if you skip doses. Skipping doses also puts you at risk for problems. Ask your doctor about side effects or reactions to medicines that you should watch  for. Contact a doctor if: You think you are having a reaction to the medicine you are taking. You have headaches that keep coming back. You feel dizzy. You have swelling in your ankles. You have trouble with your vision. Get help right away if: You get a very bad headache. You start to feel mixed up (confused). You feel weak or numb. You feel faint. You have very bad pain in your: Chest. Belly (abdomen). You vomit more than once. You have trouble breathing. These symptoms may be an emergency. Get help right away. Call 911. Do not wait to see if the symptoms will go away. Do not drive yourself to the hospital. Summary Hypertension is another name for high blood pressure. High blood pressure forces your heart to work harder to pump blood. For most people, a normal blood pressure is less than 120/80. Making healthy choices can help lower blood pressure. If your blood pressure does not get lower with healthy choices, you may need to take medicine. This information is not intended to replace advice given to you by your health care provider. Make sure you discuss any questions you have with your health care provider. Iron  Sucrose Injection What is this medication? IRON  SUCROSE (EYE ern SOO krose) treats low levels of iron  (iron  deficiency anemia) in people with kidney disease. Iron  is a mineral that plays an important role in making red blood cells, which carry oxygen  from your lungs to the rest of your body. This medicine may be used for other purposes; ask your health care provider or pharmacist if you have questions. COMMON BRAND NAME(S): Venofer  What should I tell my care team before I take this medication? They need to know if you have any of these conditions: Anemia not caused by low iron  levels Heart disease High levels of iron  in the blood Kidney disease Liver disease An unusual or allergic reaction to iron , other medications, foods, dyes, or preservatives Pregnant or trying  to get pregnant Breastfeeding How should I use this medication? This medication is for infusion into a vein. It is given in a hospital or clinic setting. Talk to your care team about the use of this medication in children. While this medication may be prescribed for children as young as 2 years for selected conditions, precautions do apply. Overdosage: If you think you have taken too much of this medicine contact a poison control center or emergency room at once. NOTE: This medicine is only for you. Do not share this medicine with others. What if I miss a dose? Keep appointments for follow-up doses. It is important not to miss your dose. Call your care team if you are unable to keep an appointment. What may interact with this medication? Do not take this medication with any of the following: Deferoxamine Dimercaprol Other iron  products This medication may also interact with the following: Chloramphenicol Deferasirox This list may not describe all possible interactions. Give your health care provider a list of all the medicines, herbs, non-prescription drugs, or dietary supplements you use. Also tell them if you smoke, drink alcohol, or use illegal drugs. Some items may interact with your medicine. What should I watch for while using this medication? Visit  your care team regularly. Tell your care team if your symptoms do not start to get better or if they get worse. You may need blood work done while you are taking this medication. You may need to follow a special diet. Talk to your care team. Foods that contain iron  include: whole grains/cereals, dried fruits, beans, or peas, leafy green vegetables, and organ meats (liver, kidney). What side effects may I notice from receiving this medication? Side effects that you should report to your care team as soon as possible: Allergic reactions--skin rash, itching, hives, swelling of the face, lips, tongue, or throat Low blood pressure--dizziness,  feeling faint or lightheaded, blurry vision Shortness of breath Side effects that usually do not require medical attention (report to your care team if they continue or are bothersome): Flushing Headache Joint pain Muscle pain Nausea Pain, redness, or irritation at injection site This list may not describe all possible side effects. Call your doctor for medical advice about side effects. You may report side effects to FDA at 1-800-FDA-1088. Where should I keep my medication? This medication is given in a hospital or clinic. It will not be stored at home. NOTE: This sheet is a summary. It may not cover all possible information. If you have questions about this medicine, talk to your doctor, pharmacist, or health care provider.  2024 Elsevier/Gold Standard (2022-06-27 00:00:00)  Document Revised: 11/08/2020 Document Reviewed: 11/08/2020 Elsevier Patient Education  2024 [employer].

## 2023-02-16 ENCOUNTER — Inpatient Hospital Stay: Payer: Medicare HMO

## 2023-02-16 VITALS — BP 140/77 | HR 79 | Temp 98.0°F | Resp 18

## 2023-02-16 DIAGNOSIS — D500 Iron deficiency anemia secondary to blood loss (chronic): Secondary | ICD-10-CM

## 2023-02-16 DIAGNOSIS — D509 Iron deficiency anemia, unspecified: Secondary | ICD-10-CM | POA: Diagnosis not present

## 2023-02-16 DIAGNOSIS — Z79899 Other long term (current) drug therapy: Secondary | ICD-10-CM | POA: Diagnosis not present

## 2023-02-16 DIAGNOSIS — K5900 Constipation, unspecified: Secondary | ICD-10-CM | POA: Diagnosis not present

## 2023-02-16 MED ORDER — IRON SUCROSE 20 MG/ML IV SOLN
200.0000 mg | Freq: Once | INTRAVENOUS | Status: AC
Start: 2023-02-16 — End: 2023-02-16
  Administered 2023-02-16: 200 mg via INTRAVENOUS
  Filled 2023-02-16: qty 10

## 2023-02-16 MED ORDER — SODIUM CHLORIDE 0.9 % IV SOLN
INTRAVENOUS | Status: DC
Start: 2023-02-16 — End: 2023-02-16

## 2023-02-16 NOTE — Patient Instructions (Signed)
 Iron Sucrose Injection What is this medication? IRON SUCROSE (EYE ern SOO krose) treats low levels of iron (iron deficiency anemia) in people with kidney disease. Iron is a mineral that plays an important role in making red blood cells, which carry oxygen from your lungs to the rest of your body. This medicine may be used for other purposes; ask your health care provider or pharmacist if you have questions. COMMON BRAND NAME(S): Venofer What should I tell my care team before I take this medication? They need to know if you have any of these conditions: Anemia not caused by low iron levels Heart disease High levels of iron in the blood Kidney disease Liver disease An unusual or allergic reaction to iron, other medications, foods, dyes, or preservatives Pregnant or trying to get pregnant Breastfeeding How should I use this medication? This medication is for infusion into a vein. It is given in a hospital or clinic setting. Talk to your care team about the use of this medication in children. While this medication may be prescribed for children as young as 2 years for selected conditions, precautions do apply. Overdosage: If you think you have taken too much of this medicine contact a poison control center or emergency room at once. NOTE: This medicine is only for you. Do not share this medicine with others. What if I miss a dose? Keep appointments for follow-up doses. It is important not to miss your dose. Call your care team if you are unable to keep an appointment. What may interact with this medication? Do not take this medication with any of the following: Deferoxamine Dimercaprol Other iron products This medication may also interact with the following: Chloramphenicol Deferasirox This list may not describe all possible interactions. Give your health care provider a list of all the medicines, herbs, non-prescription drugs, or dietary supplements you use. Also tell them if you smoke,  drink alcohol, or use illegal drugs. Some items may interact with your medicine. What should I watch for while using this medication? Visit your care team regularly. Tell your care team if your symptoms do not start to get better or if they get worse. You may need blood work done while you are taking this medication. You may need to follow a special diet. Talk to your care team. Foods that contain iron include: whole grains/cereals, dried fruits, beans, or peas, leafy green vegetables, and organ meats (liver, kidney). What side effects may I notice from receiving this medication? Side effects that you should report to your care team as soon as possible: Allergic reactions--skin rash, itching, hives, swelling of the face, lips, tongue, or throat Low blood pressure--dizziness, feeling faint or lightheaded, blurry vision Shortness of breath Side effects that usually do not require medical attention (report to your care team if they continue or are bothersome): Flushing Headache Joint pain Muscle pain Nausea Pain, redness, or irritation at injection site This list may not describe all possible side effects. Call your doctor for medical advice about side effects. You may report side effects to FDA at 1-800-FDA-1088. Where should I keep my medication? This medication is given in a hospital or clinic. It will not be stored at home. NOTE: This sheet is a summary. It may not cover all possible information. If you have questions about this medicine, talk to your doctor, pharmacist, or health care provider.  2024 Elsevier/Gold Standard (2022-06-27 00:00:00)

## 2023-02-18 ENCOUNTER — Inpatient Hospital Stay: Payer: Medicare HMO

## 2023-02-18 VITALS — BP 126/77 | HR 53 | Temp 97.8°F | Resp 18 | Ht 71.0 in | Wt 288.8 lb

## 2023-02-18 DIAGNOSIS — Z79899 Other long term (current) drug therapy: Secondary | ICD-10-CM | POA: Diagnosis not present

## 2023-02-18 DIAGNOSIS — D509 Iron deficiency anemia, unspecified: Secondary | ICD-10-CM | POA: Diagnosis not present

## 2023-02-18 DIAGNOSIS — K5900 Constipation, unspecified: Secondary | ICD-10-CM | POA: Diagnosis not present

## 2023-02-18 DIAGNOSIS — D500 Iron deficiency anemia secondary to blood loss (chronic): Secondary | ICD-10-CM

## 2023-02-18 MED ORDER — IRON SUCROSE 20 MG/ML IV SOLN
200.0000 mg | Freq: Once | INTRAVENOUS | Status: AC
  Administered 2023-02-18: 200 mg via INTRAVENOUS
  Filled 2023-02-18: qty 10

## 2023-02-18 MED ORDER — SODIUM CHLORIDE 0.9 % IV SOLN: INTRAVENOUS | Status: DC

## 2023-02-18 NOTE — Patient Instructions (Signed)
 Venofer  Iron  Sucrose Injection What is this medication? IRON  SUCROSE (EYE ern SOO krose) treats low levels of iron  (iron  deficiency anemia) in people with kidney disease. Iron  is a mineral that plays an important role in making red blood cells, which carry oxygen  from your lungs to the rest of your body. This medicine may be used for other purposes; ask your health care provider or pharmacist if you have questions. COMMON BRAND NAME(S): Venofer  What should I tell my care team before I take this medication? They need to know if you have any of these conditions: Anemia not caused by low iron  levels Heart disease High levels of iron  in the blood Kidney disease Liver disease An unusual or allergic reaction to iron , other medications, foods, dyes, or preservatives Pregnant or trying to get pregnant Breastfeeding How should I use this medication? This medication is for infusion into a vein. It is given in a hospital or clinic setting. Talk to your care team about the use of this medication in children. While this medication may be prescribed for children as young as 2 years for selected conditions, precautions do apply. Overdosage: If you think you have taken too much of this medicine contact a poison control center or emergency room at once. NOTE: This medicine is only for you. Do not share this medicine with others. What if I miss a dose? Keep appointments for follow-up doses. It is important not to miss your dose. Call your care team if you are unable to keep an appointment. What may interact with this medication? Do not take this medication with any of the following: Deferoxamine Dimercaprol Other iron  products This medication may also interact with the following: Chloramphenicol Deferasirox This list may not describe all possible interactions. Give your health care provider a list of all the medicines, herbs, non-prescription drugs, or dietary supplements you use. Also tell them if you  smoke, drink alcohol, or use illegal drugs. Some items may interact with your medicine. What should I watch for while using this medication? Visit your care team regularly. Tell your care team if your symptoms do not start to get better or if they get worse. You may need blood work done while you are taking this medication. You may need to follow a special diet. Talk to your care team. Foods that contain iron  include: whole grains/cereals, dried fruits, beans, or peas, leafy green vegetables, and organ meats (liver, kidney). What side effects may I notice from receiving this medication? Side effects that you should report to your care team as soon as possible: Allergic reactions--skin rash, itching, hives, swelling of the face, lips, tongue, or throat Low blood pressure--dizziness, feeling faint or lightheaded, blurry vision Shortness of breath Side effects that usually do not require medical attention (report to your care team if they continue or are bothersome): Flushing Headache Joint pain Muscle pain Nausea Pain, redness, or irritation at injection site This list may not describe all possible side effects. Call your doctor for medical advice about side effects. You may report side effects to FDA at 1-800-FDA-1088. Where should I keep my medication? This medication is given in a hospital or clinic. It will not be stored at home. NOTE: This sheet is a summary. It may not cover all possible information. If you have questions about this medicine, talk to your doctor, pharmacist, or health care provider.  2024 Elsevier/Gold Standard (2022-06-27 00:00:00)

## 2023-02-20 ENCOUNTER — Inpatient Hospital Stay: Payer: Medicare HMO

## 2023-02-20 VITALS — BP 158/77 | HR 89 | Temp 98.0°F | Resp 18

## 2023-02-20 DIAGNOSIS — D509 Iron deficiency anemia, unspecified: Secondary | ICD-10-CM | POA: Diagnosis not present

## 2023-02-20 DIAGNOSIS — D500 Iron deficiency anemia secondary to blood loss (chronic): Secondary | ICD-10-CM

## 2023-02-20 DIAGNOSIS — K5900 Constipation, unspecified: Secondary | ICD-10-CM | POA: Diagnosis not present

## 2023-02-20 DIAGNOSIS — Z79899 Other long term (current) drug therapy: Secondary | ICD-10-CM | POA: Diagnosis not present

## 2023-02-20 MED ORDER — IRON SUCROSE 20 MG/ML IV SOLN
200.0000 mg | Freq: Once | INTRAVENOUS | Status: AC
  Administered 2023-02-20: 200 mg via INTRAVENOUS
  Filled 2023-02-20: qty 10

## 2023-02-20 MED ORDER — SODIUM CHLORIDE 0.9 % IV SOLN: INTRAVENOUS | Status: DC

## 2023-02-23 ENCOUNTER — Inpatient Hospital Stay: Payer: Medicare HMO

## 2023-02-23 VITALS — BP 140/71 | HR 82 | Temp 97.7°F | Resp 18

## 2023-02-23 DIAGNOSIS — D509 Iron deficiency anemia, unspecified: Secondary | ICD-10-CM | POA: Diagnosis not present

## 2023-02-23 DIAGNOSIS — Z79899 Other long term (current) drug therapy: Secondary | ICD-10-CM | POA: Diagnosis not present

## 2023-02-23 DIAGNOSIS — D500 Iron deficiency anemia secondary to blood loss (chronic): Secondary | ICD-10-CM

## 2023-02-23 DIAGNOSIS — K5900 Constipation, unspecified: Secondary | ICD-10-CM | POA: Diagnosis not present

## 2023-02-23 MED ORDER — SODIUM CHLORIDE 0.9 % IV SOLN: INTRAVENOUS | Status: DC

## 2023-02-23 MED ORDER — IRON SUCROSE 20 MG/ML IV SOLN
200.0000 mg | Freq: Once | INTRAVENOUS | Status: AC
  Administered 2023-02-23: 200 mg via INTRAVENOUS
  Filled 2023-02-23: qty 10

## 2023-02-23 NOTE — Patient Instructions (Signed)

## 2023-03-05 DIAGNOSIS — R350 Frequency of micturition: Secondary | ICD-10-CM | POA: Diagnosis not present

## 2023-03-05 DIAGNOSIS — R351 Nocturia: Secondary | ICD-10-CM | POA: Diagnosis not present

## 2023-03-05 DIAGNOSIS — N401 Enlarged prostate with lower urinary tract symptoms: Secondary | ICD-10-CM | POA: Diagnosis not present

## 2023-04-01 DIAGNOSIS — R202 Paresthesia of skin: Secondary | ICD-10-CM | POA: Diagnosis not present

## 2023-04-01 DIAGNOSIS — I6523 Occlusion and stenosis of bilateral carotid arteries: Secondary | ICD-10-CM | POA: Diagnosis not present

## 2023-04-01 DIAGNOSIS — I10 Essential (primary) hypertension: Secondary | ICD-10-CM | POA: Diagnosis not present

## 2023-04-01 DIAGNOSIS — R9431 Abnormal electrocardiogram [ECG] [EKG]: Secondary | ICD-10-CM | POA: Diagnosis not present

## 2023-04-01 DIAGNOSIS — G459 Transient cerebral ischemic attack, unspecified: Secondary | ICD-10-CM | POA: Diagnosis not present

## 2023-04-01 DIAGNOSIS — I739 Peripheral vascular disease, unspecified: Secondary | ICD-10-CM | POA: Diagnosis not present

## 2023-04-01 DIAGNOSIS — E119 Type 2 diabetes mellitus without complications: Secondary | ICD-10-CM | POA: Diagnosis not present

## 2023-04-01 DIAGNOSIS — Z743 Need for continuous supervision: Secondary | ICD-10-CM | POA: Diagnosis not present

## 2023-04-01 DIAGNOSIS — R531 Weakness: Secondary | ICD-10-CM | POA: Diagnosis not present

## 2023-04-02 DIAGNOSIS — I6523 Occlusion and stenosis of bilateral carotid arteries: Secondary | ICD-10-CM | POA: Diagnosis not present

## 2023-04-02 DIAGNOSIS — I517 Cardiomegaly: Secondary | ICD-10-CM | POA: Diagnosis not present

## 2023-04-02 DIAGNOSIS — R29818 Other symptoms and signs involving the nervous system: Secondary | ICD-10-CM | POA: Diagnosis not present

## 2023-04-02 DIAGNOSIS — I739 Peripheral vascular disease, unspecified: Secondary | ICD-10-CM | POA: Diagnosis not present

## 2023-04-02 DIAGNOSIS — G459 Transient cerebral ischemic attack, unspecified: Secondary | ICD-10-CM | POA: Diagnosis not present

## 2023-04-06 DIAGNOSIS — R202 Paresthesia of skin: Secondary | ICD-10-CM | POA: Diagnosis not present

## 2023-04-06 DIAGNOSIS — M159 Polyosteoarthritis, unspecified: Secondary | ICD-10-CM | POA: Diagnosis not present

## 2023-04-06 DIAGNOSIS — Z794 Long term (current) use of insulin: Secondary | ICD-10-CM | POA: Diagnosis not present

## 2023-04-06 DIAGNOSIS — I69393 Ataxia following cerebral infarction: Secondary | ICD-10-CM | POA: Diagnosis not present

## 2023-04-06 DIAGNOSIS — K219 Gastro-esophageal reflux disease without esophagitis: Secondary | ICD-10-CM | POA: Diagnosis not present

## 2023-04-06 DIAGNOSIS — Z7982 Long term (current) use of aspirin: Secondary | ICD-10-CM | POA: Diagnosis not present

## 2023-04-06 DIAGNOSIS — I69391 Dysphagia following cerebral infarction: Secondary | ICD-10-CM | POA: Diagnosis not present

## 2023-04-06 DIAGNOSIS — M109 Gout, unspecified: Secondary | ICD-10-CM | POA: Diagnosis not present

## 2023-04-06 DIAGNOSIS — F419 Anxiety disorder, unspecified: Secondary | ICD-10-CM | POA: Diagnosis not present

## 2023-04-06 DIAGNOSIS — J4489 Other specified chronic obstructive pulmonary disease: Secondary | ICD-10-CM | POA: Diagnosis not present

## 2023-04-06 DIAGNOSIS — M519 Unspecified thoracic, thoracolumbar and lumbosacral intervertebral disc disorder: Secondary | ICD-10-CM | POA: Diagnosis not present

## 2023-04-06 DIAGNOSIS — I6523 Occlusion and stenosis of bilateral carotid arteries: Secondary | ICD-10-CM | POA: Diagnosis not present

## 2023-04-06 DIAGNOSIS — E1142 Type 2 diabetes mellitus with diabetic polyneuropathy: Secondary | ICD-10-CM | POA: Diagnosis not present

## 2023-04-06 DIAGNOSIS — D509 Iron deficiency anemia, unspecified: Secondary | ICD-10-CM | POA: Diagnosis not present

## 2023-04-06 DIAGNOSIS — G459 Transient cerebral ischemic attack, unspecified: Secondary | ICD-10-CM | POA: Diagnosis not present

## 2023-04-06 DIAGNOSIS — E782 Mixed hyperlipidemia: Secondary | ICD-10-CM | POA: Diagnosis not present

## 2023-04-06 DIAGNOSIS — Z6841 Body Mass Index (BMI) 40.0 and over, adult: Secondary | ICD-10-CM | POA: Diagnosis not present

## 2023-04-06 DIAGNOSIS — I119 Hypertensive heart disease without heart failure: Secondary | ICD-10-CM | POA: Diagnosis not present

## 2023-04-06 DIAGNOSIS — I2510 Atherosclerotic heart disease of native coronary artery without angina pectoris: Secondary | ICD-10-CM | POA: Diagnosis not present

## 2023-04-06 DIAGNOSIS — E1151 Type 2 diabetes mellitus with diabetic peripheral angiopathy without gangrene: Secondary | ICD-10-CM | POA: Diagnosis not present

## 2023-04-06 DIAGNOSIS — E66812 Obesity, class 2: Secondary | ICD-10-CM | POA: Diagnosis not present

## 2023-04-06 DIAGNOSIS — I69354 Hemiplegia and hemiparesis following cerebral infarction affecting left non-dominant side: Secondary | ICD-10-CM | POA: Diagnosis not present

## 2023-04-06 DIAGNOSIS — I700 Atherosclerosis of aorta: Secondary | ICD-10-CM | POA: Diagnosis not present

## 2023-04-06 DIAGNOSIS — F3341 Major depressive disorder, recurrent, in partial remission: Secondary | ICD-10-CM | POA: Diagnosis not present

## 2023-04-06 DIAGNOSIS — N400 Enlarged prostate without lower urinary tract symptoms: Secondary | ICD-10-CM | POA: Diagnosis not present

## 2023-04-09 DIAGNOSIS — I69391 Dysphagia following cerebral infarction: Secondary | ICD-10-CM | POA: Diagnosis not present

## 2023-04-09 DIAGNOSIS — E66812 Obesity, class 2: Secondary | ICD-10-CM | POA: Diagnosis not present

## 2023-04-09 DIAGNOSIS — I69354 Hemiplegia and hemiparesis following cerebral infarction affecting left non-dominant side: Secondary | ICD-10-CM | POA: Diagnosis not present

## 2023-04-09 DIAGNOSIS — I2510 Atherosclerotic heart disease of native coronary artery without angina pectoris: Secondary | ICD-10-CM | POA: Diagnosis not present

## 2023-04-09 DIAGNOSIS — M109 Gout, unspecified: Secondary | ICD-10-CM | POA: Diagnosis not present

## 2023-04-09 DIAGNOSIS — F419 Anxiety disorder, unspecified: Secondary | ICD-10-CM | POA: Diagnosis not present

## 2023-04-09 DIAGNOSIS — Z6841 Body Mass Index (BMI) 40.0 and over, adult: Secondary | ICD-10-CM | POA: Diagnosis not present

## 2023-04-09 DIAGNOSIS — E1142 Type 2 diabetes mellitus with diabetic polyneuropathy: Secondary | ICD-10-CM | POA: Diagnosis not present

## 2023-04-09 DIAGNOSIS — N400 Enlarged prostate without lower urinary tract symptoms: Secondary | ICD-10-CM | POA: Diagnosis not present

## 2023-04-09 DIAGNOSIS — I6523 Occlusion and stenosis of bilateral carotid arteries: Secondary | ICD-10-CM | POA: Diagnosis not present

## 2023-04-09 DIAGNOSIS — F3341 Major depressive disorder, recurrent, in partial remission: Secondary | ICD-10-CM | POA: Diagnosis not present

## 2023-04-09 DIAGNOSIS — R202 Paresthesia of skin: Secondary | ICD-10-CM | POA: Diagnosis not present

## 2023-04-09 DIAGNOSIS — K219 Gastro-esophageal reflux disease without esophagitis: Secondary | ICD-10-CM | POA: Diagnosis not present

## 2023-04-09 DIAGNOSIS — I700 Atherosclerosis of aorta: Secondary | ICD-10-CM | POA: Diagnosis not present

## 2023-04-09 DIAGNOSIS — Z794 Long term (current) use of insulin: Secondary | ICD-10-CM | POA: Diagnosis not present

## 2023-04-09 DIAGNOSIS — J4489 Other specified chronic obstructive pulmonary disease: Secondary | ICD-10-CM | POA: Diagnosis not present

## 2023-04-09 DIAGNOSIS — G459 Transient cerebral ischemic attack, unspecified: Secondary | ICD-10-CM | POA: Diagnosis not present

## 2023-04-09 DIAGNOSIS — I69393 Ataxia following cerebral infarction: Secondary | ICD-10-CM | POA: Diagnosis not present

## 2023-04-09 DIAGNOSIS — M159 Polyosteoarthritis, unspecified: Secondary | ICD-10-CM | POA: Diagnosis not present

## 2023-04-09 DIAGNOSIS — E782 Mixed hyperlipidemia: Secondary | ICD-10-CM | POA: Diagnosis not present

## 2023-04-09 DIAGNOSIS — M519 Unspecified thoracic, thoracolumbar and lumbosacral intervertebral disc disorder: Secondary | ICD-10-CM | POA: Diagnosis not present

## 2023-04-09 DIAGNOSIS — D509 Iron deficiency anemia, unspecified: Secondary | ICD-10-CM | POA: Diagnosis not present

## 2023-04-09 DIAGNOSIS — I119 Hypertensive heart disease without heart failure: Secondary | ICD-10-CM | POA: Diagnosis not present

## 2023-04-09 DIAGNOSIS — Z7982 Long term (current) use of aspirin: Secondary | ICD-10-CM | POA: Diagnosis not present

## 2023-04-09 DIAGNOSIS — E1151 Type 2 diabetes mellitus with diabetic peripheral angiopathy without gangrene: Secondary | ICD-10-CM | POA: Diagnosis not present

## 2023-04-10 DIAGNOSIS — E785 Hyperlipidemia, unspecified: Secondary | ICD-10-CM | POA: Diagnosis not present

## 2023-04-10 DIAGNOSIS — G459 Transient cerebral ischemic attack, unspecified: Secondary | ICD-10-CM | POA: Diagnosis not present

## 2023-04-10 DIAGNOSIS — I69354 Hemiplegia and hemiparesis following cerebral infarction affecting left non-dominant side: Secondary | ICD-10-CM | POA: Diagnosis not present

## 2023-04-10 DIAGNOSIS — I6523 Occlusion and stenosis of bilateral carotid arteries: Secondary | ICD-10-CM | POA: Diagnosis not present

## 2023-04-14 DIAGNOSIS — E66812 Obesity, class 2: Secondary | ICD-10-CM | POA: Diagnosis not present

## 2023-04-14 DIAGNOSIS — G459 Transient cerebral ischemic attack, unspecified: Secondary | ICD-10-CM | POA: Diagnosis not present

## 2023-04-14 DIAGNOSIS — J4489 Other specified chronic obstructive pulmonary disease: Secondary | ICD-10-CM | POA: Diagnosis not present

## 2023-04-14 DIAGNOSIS — I2510 Atherosclerotic heart disease of native coronary artery without angina pectoris: Secondary | ICD-10-CM | POA: Diagnosis not present

## 2023-04-14 DIAGNOSIS — M159 Polyosteoarthritis, unspecified: Secondary | ICD-10-CM | POA: Diagnosis not present

## 2023-04-14 DIAGNOSIS — Z7982 Long term (current) use of aspirin: Secondary | ICD-10-CM | POA: Diagnosis not present

## 2023-04-14 DIAGNOSIS — E1142 Type 2 diabetes mellitus with diabetic polyneuropathy: Secondary | ICD-10-CM | POA: Diagnosis not present

## 2023-04-14 DIAGNOSIS — I700 Atherosclerosis of aorta: Secondary | ICD-10-CM | POA: Diagnosis not present

## 2023-04-14 DIAGNOSIS — M109 Gout, unspecified: Secondary | ICD-10-CM | POA: Diagnosis not present

## 2023-04-14 DIAGNOSIS — Z6841 Body Mass Index (BMI) 40.0 and over, adult: Secondary | ICD-10-CM | POA: Diagnosis not present

## 2023-04-14 DIAGNOSIS — I119 Hypertensive heart disease without heart failure: Secondary | ICD-10-CM | POA: Diagnosis not present

## 2023-04-14 DIAGNOSIS — I69354 Hemiplegia and hemiparesis following cerebral infarction affecting left non-dominant side: Secondary | ICD-10-CM | POA: Diagnosis not present

## 2023-04-14 DIAGNOSIS — M519 Unspecified thoracic, thoracolumbar and lumbosacral intervertebral disc disorder: Secondary | ICD-10-CM | POA: Diagnosis not present

## 2023-04-14 DIAGNOSIS — E782 Mixed hyperlipidemia: Secondary | ICD-10-CM | POA: Diagnosis not present

## 2023-04-14 DIAGNOSIS — I6523 Occlusion and stenosis of bilateral carotid arteries: Secondary | ICD-10-CM | POA: Diagnosis not present

## 2023-04-14 DIAGNOSIS — N400 Enlarged prostate without lower urinary tract symptoms: Secondary | ICD-10-CM | POA: Diagnosis not present

## 2023-04-14 DIAGNOSIS — F419 Anxiety disorder, unspecified: Secondary | ICD-10-CM | POA: Diagnosis not present

## 2023-04-14 DIAGNOSIS — R202 Paresthesia of skin: Secondary | ICD-10-CM | POA: Diagnosis not present

## 2023-04-14 DIAGNOSIS — F3341 Major depressive disorder, recurrent, in partial remission: Secondary | ICD-10-CM | POA: Diagnosis not present

## 2023-04-14 DIAGNOSIS — I69393 Ataxia following cerebral infarction: Secondary | ICD-10-CM | POA: Diagnosis not present

## 2023-04-14 DIAGNOSIS — E1151 Type 2 diabetes mellitus with diabetic peripheral angiopathy without gangrene: Secondary | ICD-10-CM | POA: Diagnosis not present

## 2023-04-14 DIAGNOSIS — I69391 Dysphagia following cerebral infarction: Secondary | ICD-10-CM | POA: Diagnosis not present

## 2023-04-14 DIAGNOSIS — K219 Gastro-esophageal reflux disease without esophagitis: Secondary | ICD-10-CM | POA: Diagnosis not present

## 2023-04-14 DIAGNOSIS — D509 Iron deficiency anemia, unspecified: Secondary | ICD-10-CM | POA: Diagnosis not present

## 2023-04-14 DIAGNOSIS — Z794 Long term (current) use of insulin: Secondary | ICD-10-CM | POA: Diagnosis not present

## 2023-04-17 DIAGNOSIS — D509 Iron deficiency anemia, unspecified: Secondary | ICD-10-CM | POA: Diagnosis not present

## 2023-04-17 DIAGNOSIS — J4489 Other specified chronic obstructive pulmonary disease: Secondary | ICD-10-CM | POA: Diagnosis not present

## 2023-04-17 DIAGNOSIS — R202 Paresthesia of skin: Secondary | ICD-10-CM | POA: Diagnosis not present

## 2023-04-17 DIAGNOSIS — Z7982 Long term (current) use of aspirin: Secondary | ICD-10-CM | POA: Diagnosis not present

## 2023-04-17 DIAGNOSIS — I69393 Ataxia following cerebral infarction: Secondary | ICD-10-CM | POA: Diagnosis not present

## 2023-04-17 DIAGNOSIS — E782 Mixed hyperlipidemia: Secondary | ICD-10-CM | POA: Diagnosis not present

## 2023-04-17 DIAGNOSIS — I6523 Occlusion and stenosis of bilateral carotid arteries: Secondary | ICD-10-CM | POA: Diagnosis not present

## 2023-04-17 DIAGNOSIS — I69391 Dysphagia following cerebral infarction: Secondary | ICD-10-CM | POA: Diagnosis not present

## 2023-04-17 DIAGNOSIS — Z6841 Body Mass Index (BMI) 40.0 and over, adult: Secondary | ICD-10-CM | POA: Diagnosis not present

## 2023-04-17 DIAGNOSIS — Z794 Long term (current) use of insulin: Secondary | ICD-10-CM | POA: Diagnosis not present

## 2023-04-17 DIAGNOSIS — M519 Unspecified thoracic, thoracolumbar and lumbosacral intervertebral disc disorder: Secondary | ICD-10-CM | POA: Diagnosis not present

## 2023-04-17 DIAGNOSIS — E66812 Obesity, class 2: Secondary | ICD-10-CM | POA: Diagnosis not present

## 2023-04-17 DIAGNOSIS — I119 Hypertensive heart disease without heart failure: Secondary | ICD-10-CM | POA: Diagnosis not present

## 2023-04-17 DIAGNOSIS — I69354 Hemiplegia and hemiparesis following cerebral infarction affecting left non-dominant side: Secondary | ICD-10-CM | POA: Diagnosis not present

## 2023-04-17 DIAGNOSIS — F419 Anxiety disorder, unspecified: Secondary | ICD-10-CM | POA: Diagnosis not present

## 2023-04-17 DIAGNOSIS — K219 Gastro-esophageal reflux disease without esophagitis: Secondary | ICD-10-CM | POA: Diagnosis not present

## 2023-04-17 DIAGNOSIS — E1142 Type 2 diabetes mellitus with diabetic polyneuropathy: Secondary | ICD-10-CM | POA: Diagnosis not present

## 2023-04-17 DIAGNOSIS — I700 Atherosclerosis of aorta: Secondary | ICD-10-CM | POA: Diagnosis not present

## 2023-04-17 DIAGNOSIS — G459 Transient cerebral ischemic attack, unspecified: Secondary | ICD-10-CM | POA: Diagnosis not present

## 2023-04-17 DIAGNOSIS — I2510 Atherosclerotic heart disease of native coronary artery without angina pectoris: Secondary | ICD-10-CM | POA: Diagnosis not present

## 2023-04-17 DIAGNOSIS — M159 Polyosteoarthritis, unspecified: Secondary | ICD-10-CM | POA: Diagnosis not present

## 2023-04-17 DIAGNOSIS — N400 Enlarged prostate without lower urinary tract symptoms: Secondary | ICD-10-CM | POA: Diagnosis not present

## 2023-04-17 DIAGNOSIS — F3341 Major depressive disorder, recurrent, in partial remission: Secondary | ICD-10-CM | POA: Diagnosis not present

## 2023-04-17 DIAGNOSIS — E1151 Type 2 diabetes mellitus with diabetic peripheral angiopathy without gangrene: Secondary | ICD-10-CM | POA: Diagnosis not present

## 2023-04-17 DIAGNOSIS — M109 Gout, unspecified: Secondary | ICD-10-CM | POA: Diagnosis not present

## 2023-04-20 DIAGNOSIS — R202 Paresthesia of skin: Secondary | ICD-10-CM | POA: Diagnosis not present

## 2023-04-20 DIAGNOSIS — F3341 Major depressive disorder, recurrent, in partial remission: Secondary | ICD-10-CM | POA: Diagnosis not present

## 2023-04-20 DIAGNOSIS — E1151 Type 2 diabetes mellitus with diabetic peripheral angiopathy without gangrene: Secondary | ICD-10-CM | POA: Diagnosis not present

## 2023-04-20 DIAGNOSIS — I6523 Occlusion and stenosis of bilateral carotid arteries: Secondary | ICD-10-CM | POA: Diagnosis not present

## 2023-04-20 DIAGNOSIS — Z7982 Long term (current) use of aspirin: Secondary | ICD-10-CM | POA: Diagnosis not present

## 2023-04-20 DIAGNOSIS — J4489 Other specified chronic obstructive pulmonary disease: Secondary | ICD-10-CM | POA: Diagnosis not present

## 2023-04-20 DIAGNOSIS — I119 Hypertensive heart disease without heart failure: Secondary | ICD-10-CM | POA: Diagnosis not present

## 2023-04-20 DIAGNOSIS — Z6841 Body Mass Index (BMI) 40.0 and over, adult: Secondary | ICD-10-CM | POA: Diagnosis not present

## 2023-04-20 DIAGNOSIS — I69354 Hemiplegia and hemiparesis following cerebral infarction affecting left non-dominant side: Secondary | ICD-10-CM | POA: Diagnosis not present

## 2023-04-20 DIAGNOSIS — M159 Polyosteoarthritis, unspecified: Secondary | ICD-10-CM | POA: Diagnosis not present

## 2023-04-20 DIAGNOSIS — E1142 Type 2 diabetes mellitus with diabetic polyneuropathy: Secondary | ICD-10-CM | POA: Diagnosis not present

## 2023-04-20 DIAGNOSIS — I700 Atherosclerosis of aorta: Secondary | ICD-10-CM | POA: Diagnosis not present

## 2023-04-20 DIAGNOSIS — N400 Enlarged prostate without lower urinary tract symptoms: Secondary | ICD-10-CM | POA: Diagnosis not present

## 2023-04-20 DIAGNOSIS — D509 Iron deficiency anemia, unspecified: Secondary | ICD-10-CM | POA: Diagnosis not present

## 2023-04-20 DIAGNOSIS — I69393 Ataxia following cerebral infarction: Secondary | ICD-10-CM | POA: Diagnosis not present

## 2023-04-20 DIAGNOSIS — F419 Anxiety disorder, unspecified: Secondary | ICD-10-CM | POA: Diagnosis not present

## 2023-04-20 DIAGNOSIS — M519 Unspecified thoracic, thoracolumbar and lumbosacral intervertebral disc disorder: Secondary | ICD-10-CM | POA: Diagnosis not present

## 2023-04-20 DIAGNOSIS — E782 Mixed hyperlipidemia: Secondary | ICD-10-CM | POA: Diagnosis not present

## 2023-04-20 DIAGNOSIS — M109 Gout, unspecified: Secondary | ICD-10-CM | POA: Diagnosis not present

## 2023-04-20 DIAGNOSIS — I69391 Dysphagia following cerebral infarction: Secondary | ICD-10-CM | POA: Diagnosis not present

## 2023-04-20 DIAGNOSIS — K219 Gastro-esophageal reflux disease without esophagitis: Secondary | ICD-10-CM | POA: Diagnosis not present

## 2023-04-20 DIAGNOSIS — I2510 Atherosclerotic heart disease of native coronary artery without angina pectoris: Secondary | ICD-10-CM | POA: Diagnosis not present

## 2023-04-20 DIAGNOSIS — E66812 Obesity, class 2: Secondary | ICD-10-CM | POA: Diagnosis not present

## 2023-04-20 DIAGNOSIS — G459 Transient cerebral ischemic attack, unspecified: Secondary | ICD-10-CM | POA: Diagnosis not present

## 2023-04-20 DIAGNOSIS — Z794 Long term (current) use of insulin: Secondary | ICD-10-CM | POA: Diagnosis not present

## 2023-04-21 DIAGNOSIS — Z0000 Encounter for general adult medical examination without abnormal findings: Secondary | ICD-10-CM | POA: Diagnosis not present

## 2023-04-21 DIAGNOSIS — G459 Transient cerebral ischemic attack, unspecified: Secondary | ICD-10-CM | POA: Diagnosis not present

## 2023-04-21 DIAGNOSIS — E785 Hyperlipidemia, unspecified: Secondary | ICD-10-CM | POA: Diagnosis not present

## 2023-04-21 DIAGNOSIS — R600 Localized edema: Secondary | ICD-10-CM | POA: Diagnosis not present

## 2023-04-21 DIAGNOSIS — I10 Essential (primary) hypertension: Secondary | ICD-10-CM | POA: Diagnosis not present

## 2023-04-21 DIAGNOSIS — Z125 Encounter for screening for malignant neoplasm of prostate: Secondary | ICD-10-CM | POA: Diagnosis not present

## 2023-04-21 DIAGNOSIS — I25118 Atherosclerotic heart disease of native coronary artery with other forms of angina pectoris: Secondary | ICD-10-CM | POA: Diagnosis not present

## 2023-04-21 DIAGNOSIS — D509 Iron deficiency anemia, unspecified: Secondary | ICD-10-CM | POA: Diagnosis not present

## 2023-04-21 DIAGNOSIS — E1142 Type 2 diabetes mellitus with diabetic polyneuropathy: Secondary | ICD-10-CM | POA: Diagnosis not present

## 2023-04-21 DIAGNOSIS — I6523 Occlusion and stenosis of bilateral carotid arteries: Secondary | ICD-10-CM | POA: Diagnosis not present

## 2023-04-21 DIAGNOSIS — N401 Enlarged prostate with lower urinary tract symptoms: Secondary | ICD-10-CM | POA: Diagnosis not present

## 2023-04-21 DIAGNOSIS — Z9181 History of falling: Secondary | ICD-10-CM | POA: Diagnosis not present

## 2023-04-21 DIAGNOSIS — I69354 Hemiplegia and hemiparesis following cerebral infarction affecting left non-dominant side: Secondary | ICD-10-CM | POA: Diagnosis not present

## 2023-04-23 ENCOUNTER — Encounter: Payer: Self-pay | Admitting: Oncology

## 2023-04-27 DIAGNOSIS — N400 Enlarged prostate without lower urinary tract symptoms: Secondary | ICD-10-CM | POA: Diagnosis not present

## 2023-04-27 DIAGNOSIS — I119 Hypertensive heart disease without heart failure: Secondary | ICD-10-CM | POA: Diagnosis not present

## 2023-04-27 DIAGNOSIS — I2510 Atherosclerotic heart disease of native coronary artery without angina pectoris: Secondary | ICD-10-CM | POA: Diagnosis not present

## 2023-04-27 DIAGNOSIS — I69354 Hemiplegia and hemiparesis following cerebral infarction affecting left non-dominant side: Secondary | ICD-10-CM | POA: Diagnosis not present

## 2023-04-27 DIAGNOSIS — E1151 Type 2 diabetes mellitus with diabetic peripheral angiopathy without gangrene: Secondary | ICD-10-CM | POA: Diagnosis not present

## 2023-04-27 DIAGNOSIS — F419 Anxiety disorder, unspecified: Secondary | ICD-10-CM | POA: Diagnosis not present

## 2023-04-27 DIAGNOSIS — D509 Iron deficiency anemia, unspecified: Secondary | ICD-10-CM | POA: Diagnosis not present

## 2023-04-27 DIAGNOSIS — Z794 Long term (current) use of insulin: Secondary | ICD-10-CM | POA: Diagnosis not present

## 2023-04-27 DIAGNOSIS — Z6841 Body Mass Index (BMI) 40.0 and over, adult: Secondary | ICD-10-CM | POA: Diagnosis not present

## 2023-04-27 DIAGNOSIS — M109 Gout, unspecified: Secondary | ICD-10-CM | POA: Diagnosis not present

## 2023-04-27 DIAGNOSIS — G459 Transient cerebral ischemic attack, unspecified: Secondary | ICD-10-CM | POA: Diagnosis not present

## 2023-04-27 DIAGNOSIS — J4489 Other specified chronic obstructive pulmonary disease: Secondary | ICD-10-CM | POA: Diagnosis not present

## 2023-04-27 DIAGNOSIS — I700 Atherosclerosis of aorta: Secondary | ICD-10-CM | POA: Diagnosis not present

## 2023-04-27 DIAGNOSIS — I69391 Dysphagia following cerebral infarction: Secondary | ICD-10-CM | POA: Diagnosis not present

## 2023-04-27 DIAGNOSIS — F3341 Major depressive disorder, recurrent, in partial remission: Secondary | ICD-10-CM | POA: Diagnosis not present

## 2023-04-27 DIAGNOSIS — Z7982 Long term (current) use of aspirin: Secondary | ICD-10-CM | POA: Diagnosis not present

## 2023-04-27 DIAGNOSIS — M519 Unspecified thoracic, thoracolumbar and lumbosacral intervertebral disc disorder: Secondary | ICD-10-CM | POA: Diagnosis not present

## 2023-04-27 DIAGNOSIS — M159 Polyosteoarthritis, unspecified: Secondary | ICD-10-CM | POA: Diagnosis not present

## 2023-04-27 DIAGNOSIS — R202 Paresthesia of skin: Secondary | ICD-10-CM | POA: Diagnosis not present

## 2023-04-27 DIAGNOSIS — E66812 Obesity, class 2: Secondary | ICD-10-CM | POA: Diagnosis not present

## 2023-04-27 DIAGNOSIS — K219 Gastro-esophageal reflux disease without esophagitis: Secondary | ICD-10-CM | POA: Diagnosis not present

## 2023-04-27 DIAGNOSIS — E782 Mixed hyperlipidemia: Secondary | ICD-10-CM | POA: Diagnosis not present

## 2023-04-27 DIAGNOSIS — E1142 Type 2 diabetes mellitus with diabetic polyneuropathy: Secondary | ICD-10-CM | POA: Diagnosis not present

## 2023-04-27 DIAGNOSIS — I6523 Occlusion and stenosis of bilateral carotid arteries: Secondary | ICD-10-CM | POA: Diagnosis not present

## 2023-04-27 DIAGNOSIS — I69393 Ataxia following cerebral infarction: Secondary | ICD-10-CM | POA: Diagnosis not present

## 2023-04-30 ENCOUNTER — Encounter: Payer: Self-pay | Admitting: Neurology

## 2023-04-30 ENCOUNTER — Ambulatory Visit: Payer: Medicare HMO | Admitting: Adult Health

## 2023-04-30 ENCOUNTER — Ambulatory Visit: Payer: Medicare HMO | Admitting: Neurology

## 2023-05-04 DIAGNOSIS — I69393 Ataxia following cerebral infarction: Secondary | ICD-10-CM | POA: Diagnosis not present

## 2023-05-04 DIAGNOSIS — D509 Iron deficiency anemia, unspecified: Secondary | ICD-10-CM | POA: Diagnosis not present

## 2023-05-04 DIAGNOSIS — I2510 Atherosclerotic heart disease of native coronary artery without angina pectoris: Secondary | ICD-10-CM | POA: Diagnosis not present

## 2023-05-04 DIAGNOSIS — F3341 Major depressive disorder, recurrent, in partial remission: Secondary | ICD-10-CM | POA: Diagnosis not present

## 2023-05-04 DIAGNOSIS — E782 Mixed hyperlipidemia: Secondary | ICD-10-CM | POA: Diagnosis not present

## 2023-05-04 DIAGNOSIS — I69391 Dysphagia following cerebral infarction: Secondary | ICD-10-CM | POA: Diagnosis not present

## 2023-05-04 DIAGNOSIS — R202 Paresthesia of skin: Secondary | ICD-10-CM | POA: Diagnosis not present

## 2023-05-04 DIAGNOSIS — Z7982 Long term (current) use of aspirin: Secondary | ICD-10-CM | POA: Diagnosis not present

## 2023-05-04 DIAGNOSIS — E1151 Type 2 diabetes mellitus with diabetic peripheral angiopathy without gangrene: Secondary | ICD-10-CM | POA: Diagnosis not present

## 2023-05-04 DIAGNOSIS — Z6841 Body Mass Index (BMI) 40.0 and over, adult: Secondary | ICD-10-CM | POA: Diagnosis not present

## 2023-05-04 DIAGNOSIS — M109 Gout, unspecified: Secondary | ICD-10-CM | POA: Diagnosis not present

## 2023-05-04 DIAGNOSIS — K219 Gastro-esophageal reflux disease without esophagitis: Secondary | ICD-10-CM | POA: Diagnosis not present

## 2023-05-04 DIAGNOSIS — I69354 Hemiplegia and hemiparesis following cerebral infarction affecting left non-dominant side: Secondary | ICD-10-CM | POA: Diagnosis not present

## 2023-05-04 DIAGNOSIS — N400 Enlarged prostate without lower urinary tract symptoms: Secondary | ICD-10-CM | POA: Diagnosis not present

## 2023-05-04 DIAGNOSIS — G459 Transient cerebral ischemic attack, unspecified: Secondary | ICD-10-CM | POA: Diagnosis not present

## 2023-05-04 DIAGNOSIS — I119 Hypertensive heart disease without heart failure: Secondary | ICD-10-CM | POA: Diagnosis not present

## 2023-05-04 DIAGNOSIS — J4489 Other specified chronic obstructive pulmonary disease: Secondary | ICD-10-CM | POA: Diagnosis not present

## 2023-05-04 DIAGNOSIS — I6523 Occlusion and stenosis of bilateral carotid arteries: Secondary | ICD-10-CM | POA: Diagnosis not present

## 2023-05-04 DIAGNOSIS — F419 Anxiety disorder, unspecified: Secondary | ICD-10-CM | POA: Diagnosis not present

## 2023-05-04 DIAGNOSIS — M519 Unspecified thoracic, thoracolumbar and lumbosacral intervertebral disc disorder: Secondary | ICD-10-CM | POA: Diagnosis not present

## 2023-05-04 DIAGNOSIS — I700 Atherosclerosis of aorta: Secondary | ICD-10-CM | POA: Diagnosis not present

## 2023-05-04 DIAGNOSIS — E66812 Obesity, class 2: Secondary | ICD-10-CM | POA: Diagnosis not present

## 2023-05-04 DIAGNOSIS — Z794 Long term (current) use of insulin: Secondary | ICD-10-CM | POA: Diagnosis not present

## 2023-05-04 DIAGNOSIS — M159 Polyosteoarthritis, unspecified: Secondary | ICD-10-CM | POA: Diagnosis not present

## 2023-05-04 DIAGNOSIS — E1142 Type 2 diabetes mellitus with diabetic polyneuropathy: Secondary | ICD-10-CM | POA: Diagnosis not present

## 2023-05-13 DIAGNOSIS — R350 Frequency of micturition: Secondary | ICD-10-CM | POA: Diagnosis not present

## 2023-05-13 DIAGNOSIS — N401 Enlarged prostate with lower urinary tract symptoms: Secondary | ICD-10-CM | POA: Diagnosis not present

## 2023-05-13 DIAGNOSIS — C676 Malignant neoplasm of ureteric orifice: Secondary | ICD-10-CM | POA: Diagnosis not present

## 2023-05-13 DIAGNOSIS — R351 Nocturia: Secondary | ICD-10-CM | POA: Diagnosis not present

## 2023-05-25 DIAGNOSIS — B9689 Other specified bacterial agents as the cause of diseases classified elsewhere: Secondary | ICD-10-CM | POA: Diagnosis not present

## 2023-05-25 DIAGNOSIS — J019 Acute sinusitis, unspecified: Secondary | ICD-10-CM | POA: Diagnosis not present

## 2023-06-08 NOTE — Progress Notes (Deleted)
 [facility]  [address] [place],  [place]  [address] [phone]  Clinic Day:  06/08/2023  Referring physician: Adrian Hopper, MD   HISTORY OF PRESENT ILLNESS:  The patient is a 73 y.o. male  who I recently began seeing for iron  deficiency anemia.  He comes in today to reassess his labs after receiving IV iron  in January 2025.  Since receiving his IV iron ,   He still takes an iron  pill on a daily basis, but it still causes him constipation.    PHYSICAL EXAM:  There were no vitals taken for this visit. Wt Readings from Last 3 Encounters:  02/18/23 288 lb 12 oz (131 kg)  02/09/23 286 lb 3.2 oz (129.8 kg)  11/03/22 280 lb (127 kg)   There is no height or weight on file to calculate BMI. Performance status (ECOG): 2 - Symptomatic, <50% confined to bed Physical Exam Constitutional:      Appearance: Normal appearance. He is obese. He is not ill-appearing.  HENT:     Mouth/Throat:     Mouth: Mucous membranes are moist.     Pharynx: Oropharynx is clear. No oropharyngeal exudate or posterior oropharyngeal erythema.  Cardiovascular:     Rate and Rhythm: Normal rate and regular rhythm.     Heart sounds: No murmur heard.    No friction rub. No gallop.  Pulmonary:     Effort: Pulmonary effort is normal. No respiratory distress.     Breath sounds: Normal breath sounds. No wheezing, rhonchi or rales.  Abdominal:     General: Bowel sounds are normal. There is no distension.     Palpations: Abdomen is soft. There is no mass.     Tenderness: There is no abdominal tenderness.  Musculoskeletal:        General: No swelling.     Right lower leg: No edema.     Left lower leg: No edema.  Lymphadenopathy:     Cervical: No cervical adenopathy.     Upper Body:     Right upper body: No supraclavicular or axillary adenopathy.     Left upper body: No supraclavicular or axillary adenopathy.     Lower Body: No right inguinal adenopathy. No left inguinal  adenopathy.  Skin:    General: Skin is warm.     Coloration: Skin is not jaundiced.     Findings: No lesion or rash.  Neurological:     General: No focal deficit present.     Mental Status: He is alert and oriented to person, place, and time. Mental status is at baseline.  Psychiatric:        Mood and Affect: Mood normal.        Behavior: Behavior normal.        Thought Content: Thought content normal.     LABS:       Latest Ref Rng & Units 02/09/2023   10:25 AM 10/09/2022   12:00 AM 09/25/2022   11:55 AM  CBC  WBC 4.0 - 10.5 K/uL 8.1  8.3     8.7   Hemoglobin 13.0 - 17.0 g/dL 91.4  78.2     95.6   Hematocrit 39.0 - 52.0 % 36.5  36     35.8   Platelets 150 - 400 K/uL 291  307     305.0      This result is from an external source.      Latest Ref Rng & Units 09/25/2022  11:55 AM 07/26/2020    2:28 PM 01/05/2020   11:20 AM  CMP  Glucose 70 - 99 mg/dL 784  696  295   BUN 6 - 23 mg/dL 24  18  20    Creatinine 0.40 - 1.50 mg/dL 2.84  1.32  4.40   Sodium 135 - 145 mEq/L 136  139  137   Potassium 3.5 - 5.1 mEq/L 3.8  4.7  4.2   Chloride 96 - 112 mEq/L 97  100  98   CO2 19 - 32 mEq/L 29  22  24    Calcium  8.4 - 10.5 mg/dL 9.8  9.9  10.2   Total Protein 6.0 - 8.3 g/dL 8.0     Total Bilirubin 0.2 - 1.2 mg/dL 0.3     Alkaline Phos 39 - 117 U/L 67     AST 0 - 37 U/L 48     ALT 0 - 53 U/L 37       Latest Reference Range & Units 02/09/23 10:24  Iron  45 - 182 ug/dL 48  UIBC ug/dL 725  TIBC 366 - 440 ug/dL 347 (H)  Saturation Ratios 17.9 - 39.5 % 9 (L)  Ferritin 24 - 336 ng/mL 16 (L)  (H): Data is abnormally high (L): Data is abnormally low  ASSESSMENT & PLAN:  A 73 y.o. male with iron  deficiency anemia.  Once again, his labs show persistent iron  deficiency.   Based upon this, I will arrange for him to receive IV iron  over these next few weeks to rapidly replenish his iron  stores and normalize his hemoglobin.  I also believe he needs a repeat GI workup to ensure there is no  adverse GI process present.  I will see him back in 4 months to reassess his iron  and hemoglobin levels to see how well he responded to his upcoming IV iron .  The patient understands all the plans discussed today and is in agreement with them.  [name] Williams Felicia Horde, MD

## 2023-06-09 ENCOUNTER — Inpatient Hospital Stay: Payer: Medicare HMO | Attending: Oncology | Admitting: Oncology

## 2023-06-09 ENCOUNTER — Inpatient Hospital Stay: Payer: Medicare HMO

## 2023-06-09 DIAGNOSIS — Z79899 Other long term (current) drug therapy: Secondary | ICD-10-CM | POA: Insufficient documentation

## 2023-06-09 DIAGNOSIS — E669 Obesity, unspecified: Secondary | ICD-10-CM | POA: Insufficient documentation

## 2023-06-09 DIAGNOSIS — D509 Iron deficiency anemia, unspecified: Secondary | ICD-10-CM | POA: Insufficient documentation

## 2023-06-10 ENCOUNTER — Encounter: Payer: Self-pay | Admitting: Oncology

## 2023-06-11 ENCOUNTER — Other Ambulatory Visit: Payer: Self-pay

## 2023-06-11 DIAGNOSIS — I6523 Occlusion and stenosis of bilateral carotid arteries: Secondary | ICD-10-CM

## 2023-06-11 NOTE — Progress Notes (Deleted)
 Patient ID: [name] Williams, male   DOB: 09-09-1950, 73 y.o.   MRN: [id number]  Reason for Consult: No chief complaint on file.   Referred by Adrian Hopper, MD  Subjective:     HPI  [name] Williams is a 73 y.o. 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) 10/13/2019   DDD (degenerative disc disease), cervical    Depression    Dyslipidemia 05/29/2017   Essential hypertension 05/29/2017   Generalized osteoarthritis of multiple sites    Gout    Hyperlipidemia    Hypertension, benign    Laryngopharyngeal reflux    Morbid obesity (HCC) 11/26/2018   Shortness of breath 07/17/2017   Sinus arrhythmia 11/26/2018   Stroke (HCC) 08/2022   [facility] hospitalized   Swelling 05/29/2017   Tobacco abuse    Type 2 diabetes mellitus without complication, without long-term current use of insulin  (HCC) 05/29/2017   Uncontrolled type 2 diabetes mellitus with peripheral neuropathy    Family History  Problem Relation Age of Onset   Heart disease Mother    Heart attack Mother    Stroke Father    Lung cancer Father    Alcohol abuse Father    Diabetes Brother    Stomach cancer Maternal Grandmother    Stomach cancer Paternal Grandmother    Colon cancer Neg Hx    Esophageal cancer Neg Hx    Rectal cancer Neg Hx    Past Surgical History:  Procedure Laterality Date   HERNIA REPAIR  1998   Abdominal   LEFT HEART CATH AND CORONARY ANGIOGRAPHY N/A 10/13/2019   Procedure: LEFT HEART CATH AND CORONARY ANGIOGRAPHY;  Surgeon: Sammy Crisp, MD;  Location: [facility];  Service: Cardiovascular;  Laterality: N/A;   MINOR HEMORRHOIDECTOMY  1998   NASAL SINUS SURGERY   1978   SHOULDER SURGERY Left    X2   TONSILLECTOMY  1967    Short Social History:  Social History   Tobacco Use   Smoking status: Former   Smokeless tobacco: Former    Types: Chew  Substance Use Topics   Alcohol use: Not Currently    Comment: occ wine    Allergies  Allergen Reactions   Pseudoephedrine Other (See Comments)   Penicillins Rash   Tetracyclines & Related Rash    Current Outpatient Medications  Medication Sig Dispense Refill   allopurinol (ZYLOPRIM) 300 MG tablet Take 300 mg by mouth daily.     amLODipine  (NORVASC ) 5 MG tablet Take 5 mg by mouth daily.      aspirin  EC 81 MG tablet Take 81 mg by mouth daily. Swallow whole.     atorvastatin  (LIPITOR ) 80 MG tablet Take 80 mg by mouth at bedtime.      BD PEN NEEDLE NANO 2ND GEN 32G X 4 MM MISC      cetirizine (ZYRTEC) 10 MG tablet Take 10 mg by mouth daily.      citalopram  (CELEXA ) 20 MG tablet Take 20 mg by mouth daily.     clopidogrel (PLAVIX) 75 MG tablet Take 75 mg by mouth daily.     docusate sodium (COLACE) 100 MG  capsule Take 100 mg by mouth daily.     doxycycline (VIBRA-TABS) 100 MG tablet Take 100 mg by mouth 2 (two) times daily. (Patient not taking: Reported on 10/13/2022)     FEROSUL 325 (65 Fe) MG tablet TAKE 1 TABLET EVERY OTHER DAY 45 tablet 2   finasteride (PROSCAR) 5 MG tablet Take 5 mg by mouth daily.     furosemide  (LASIX ) 20 MG tablet Take 20 mg by mouth daily.     gabapentin  (NEURONTIN ) 300 MG capsule Take 2 pills (600 mg) in the morning and 3 pills (900 mg) at bedtime 150 capsule 6   gemfibrozil  (LOPID ) 600 MG tablet Take 600 mg by mouth 2 (two) times daily before a meal.     glimepiride (AMARYL) 4 MG tablet Take 4 mg by mouth 2 (two) times daily. (Patient not taking: Reported on 10/13/2022)     isosorbide  mononitrate (IMDUR ) 30 MG 24 hr tablet Take 30 mg by mouth daily.     losartan  (COZAAR ) 100 MG tablet Take 100 mg by mouth at bedtime.      meloxicam (MOBIC) 7.5 MG tablet Take 7.5 mg by mouth 2  (two) times daily.     metFORMIN  (GLUCOPHAGE ) 1000 MG tablet Take 1 tablet (1,000 mg total) by mouth 2 (two) times daily with a meal.     metoprolol  succinate (TOPROL -XL) 25 MG 24 hr tablet Take 12.5 mg by mouth daily.     montelukast  (SINGULAIR ) 10 MG tablet Take 10 mg by mouth daily.      naphazoline-pheniramine (NAPHCON-A) 0.025-0.3 % ophthalmic solution Place 1 drop into both eyes 3 (three) times daily as needed for eye irritation (dry/irritated/allergy eyes.).      nitroGLYCERIN  (NITROSTAT ) 0.4 MG SL tablet Place 0.4 mg under the tongue every 5 (five) minutes as needed for chest pain.     NOVOLOG  FLEXPEN 100 UNIT/ML FlexPen Inject 20 Units into the skin in the morning, at noon, and at bedtime.      Omega-3 Fatty Acids (OMEGA 3 PO) Take 2 capsules by mouth daily. Omega XL     omeprazole (PRILOSEC) 40 MG capsule Take 40 mg by mouth at bedtime.     pantoprazole  (PROTONIX ) 40 MG tablet Take 40 mg by mouth daily.     potassium chloride  SA (KLOR-CON  M) 20 MEQ tablet Take 20 mEq by mouth daily.     tamsulosin  (FLOMAX ) 0.4 MG CAPS capsule Take 0.4 mg by mouth daily.     traMADol-acetaminophen  (ULTRACET) 37.5-325 MG tablet Take 1 tablet by mouth as needed for moderate pain or severe pain. (Patient not taking: Reported on 10/13/2022)     TRESIBA FLEXTOUCH 100 UNIT/ML FlexTouch Pen Inject 60 Units into the skin daily.  (Patient not taking: Reported on 10/13/2022)     valACYclovir (VALTREX) 1000 MG tablet Take 2,000 mg by mouth 2 (two) times daily. (Patient not taking: Reported on 10/13/2022)     No current facility-administered medications for this visit.    REVIEW OF SYSTEMS All other systems were reviewed and are negative     Objective:  Objective   There were no vitals filed for this visit. There is no height or weight on file to calculate BMI.  Physical Exam General: no acute distress Cardiac: hemodynamically stable Pulm: normal work of breathing Abdomen: non-tender, no pulsatile  mass*** Neuro: alert, no focal deficit Extremities: no edema, cyanosis or wounds*** Vascular:   Right: ***  Left: ***   Data: ***     Assessment/Plan:  [name] Williams is a 73 y.o. male ***    Recommendations to optimize cardiovascular risk: Abstinence from all tobacco products. Blood glucose control with goal A1c < 7%. Blood pressure control with goal blood pressure < 140/90 mmHg. Lipid reduction therapy with goal LDL-C <100 mg/dL  Aspirin  81mg  PO QD.  Atorvastatin  40-80mg  PO QD (or other "high intensity" statin therapy).     Philipp Brawn MD [facility]

## 2023-06-11 NOTE — Progress Notes (Signed)
 Patient ID: [name] Williams, male   DOB: March 23, 1950, 73 y.o.   MRN: [id number]  Reason for Consult: New Patient (Initial Visit)   Referred by Adrian Hopper, MD  Subjective:     HPI  [name] Williams is a 73 y.o. male presenting for evaluation after having a TIA with transient right arm and leg numbness in February.  He reports he thinks he has had TIAs in the past with some left-sided symptoms although does not recall the timeframe.  His right leg numbness and weakness resolved, he underwent stroke workup at [facility] which demonstrated mild stenosis of the carotids bilaterally on the CTA.  He is a previous smoker.  He is compliant with aspirin , Plavix and statin.  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) 10/13/2019   DDD (degenerative disc disease), cervical    Depression    Dyslipidemia 05/29/2017   Essential hypertension 05/29/2017   Generalized osteoarthritis of multiple sites    Gout    Hyperlipidemia    Hypertension, benign    Laryngopharyngeal reflux    Morbid obesity (HCC) 11/26/2018   Shortness of breath 07/17/2017   Sinus arrhythmia 11/26/2018   Stroke (HCC) 08/2022   [facility] hospitalized   Swelling 05/29/2017   Tobacco abuse    Type 2 diabetes mellitus without complication, without long-term current use of insulin  (HCC) 05/29/2017   Uncontrolled type 2 diabetes mellitus with peripheral neuropathy    Family History  Problem Relation Age of Onset   Heart disease Mother    Heart attack Mother    Stroke Father    Lung cancer Father    Alcohol abuse Father    Diabetes Brother    Stomach cancer Maternal Grandmother    Stomach cancer Paternal  Grandmother    Colon cancer Neg Hx    Esophageal cancer Neg Hx    Rectal cancer Neg Hx    Past Surgical History:  Procedure Laterality Date   HERNIA REPAIR  1998   Abdominal   LEFT HEART CATH AND CORONARY ANGIOGRAPHY N/A 10/13/2019   Procedure: LEFT HEART CATH AND CORONARY ANGIOGRAPHY;  Surgeon: Sammy Crisp, MD;  Location: [facility];  Service: Cardiovascular;  Laterality: N/A;   MINOR HEMORRHOIDECTOMY  1998   NASAL SINUS SURGERY  1978   SHOULDER SURGERY Left    X2   TONSILLECTOMY  1967    Short Social History:  Social History   Tobacco Use   Smoking status: Former   Smokeless tobacco: Former    Types: Chew  Substance Use Topics   Alcohol use: Not Currently    Comment: occ wine    Allergies  Allergen Reactions   Pseudoephedrine Other (See Comments)   Penicillins Rash   Tetracyclines & Related Rash    Current Outpatient Medications  Medication Sig Dispense Refill   allopurinol (ZYLOPRIM) 300 MG tablet Take 300 mg by mouth daily.     amLODipine  (NORVASC ) 5 MG tablet Take 5 mg by mouth daily.      aspirin  EC 81 MG tablet Take 81 mg by mouth daily. Swallow whole.     atorvastatin  (LIPITOR ) 80 MG tablet Take 80  mg by mouth at bedtime.      BD PEN NEEDLE NANO 2ND GEN 32G X 4 MM MISC      cetirizine (ZYRTEC) 10 MG tablet Take 10 mg by mouth daily.      citalopram  (CELEXA ) 20 MG tablet Take 20 mg by mouth daily.     clopidogrel (PLAVIX) 75 MG tablet Take 75 mg by mouth daily.     docusate sodium (COLACE) 100 MG capsule Take 100 mg by mouth daily.     FEROSUL 325 (65 Fe) MG tablet TAKE 1 TABLET EVERY OTHER DAY 45 tablet 2   finasteride (PROSCAR) 5 MG tablet Take 5 mg by mouth daily.     furosemide  (LASIX ) 20 MG tablet Take 20 mg by mouth daily.     gabapentin  (NEURONTIN ) 300 MG capsule Take 2 pills (600 mg) in the morning and 3 pills (900 mg) at bedtime 150 capsule 6   gemfibrozil  (LOPID ) 600 MG tablet Take 600 mg by mouth 2 (two) times daily before a meal.      glimepiride (AMARYL) 4 MG tablet Take 4 mg by mouth 2 (two) times daily.     isosorbide  mononitrate (IMDUR ) 30 MG 24 hr tablet Take 30 mg by mouth daily.     losartan  (COZAAR ) 100 MG tablet Take 100 mg by mouth at bedtime.      meloxicam (MOBIC) 7.5 MG tablet Take 7.5 mg by mouth 2 (two) times daily.     metFORMIN  (GLUCOPHAGE ) 1000 MG tablet Take 1 tablet (1,000 mg total) by mouth 2 (two) times daily with a meal.     metoprolol  succinate (TOPROL -XL) 25 MG 24 hr tablet Take 12.5 mg by mouth daily.     montelukast  (SINGULAIR ) 10 MG tablet Take 10 mg by mouth daily.      naphazoline-pheniramine (NAPHCON-A) 0.025-0.3 % ophthalmic solution Place 1 drop into both eyes 3 (three) times daily as needed for eye irritation (dry/irritated/allergy eyes.).      nitroGLYCERIN  (NITROSTAT ) 0.4 MG SL tablet Place 0.4 mg under the tongue every 5 (five) minutes as needed for chest pain.     NOVOLOG  FLEXPEN 100 UNIT/ML FlexPen Inject 20 Units into the skin in the morning, at noon, and at bedtime.      Omega-3 Fatty Acids (OMEGA 3 PO) Take 2 capsules by mouth daily. Omega XL     omeprazole (PRILOSEC) 40 MG capsule Take 40 mg by mouth at bedtime.     pantoprazole  (PROTONIX ) 40 MG tablet Take 40 mg by mouth daily.     potassium chloride  SA (KLOR-CON  M) 20 MEQ tablet Take 20 mEq by mouth daily.     tamsulosin  (FLOMAX ) 0.4 MG CAPS capsule Take 0.4 mg by mouth daily.     traMADol-acetaminophen  (ULTRACET) 37.5-325 MG tablet Take 1 tablet by mouth as needed for moderate pain (pain score 4-6) or severe pain (pain score 7-10).     TRESIBA FLEXTOUCH 100 UNIT/ML FlexTouch Pen Inject 60 Units into the skin daily.     valACYclovir (VALTREX) 1000 MG tablet Take 2,000 mg by mouth 2 (two) times daily.     doxycycline (VIBRA-TABS) 100 MG tablet Take 100 mg by mouth 2 (two) times daily. (Patient not taking: Reported on 06/12/2023)     No current facility-administered medications for this visit.    REVIEW OF SYSTEMS   All other  systems were reviewed and are negative     Objective:  Objective   Vitals:   06/12/23 1211 06/12/23 1215  BP:  127/80 126/69  Pulse: (!) 106   Resp: 20   Temp: 98 F (36.7 C)   TempSrc: Temporal   SpO2: 95%   Weight: 283 lb 6.4 oz (128.5 kg)   Height: 5\' 11"  (1.803 m)    Body mass index is 39.53 kg/m.  Physical Exam General: no acute distress Cardiac: hemodynamically stable Pulm: normal work of breathing Abdomen: non-tender, no pulsatile mass Neuro: alert, no focal deficit Extremities: no edema, or cyanosis Vascular:   Right: Palpable radial  Left: Palpable radial   Data: CTA independently reviewed Minimal disease of the carotid bulbs bilaterally, no apparent stenosis.  Carotid duplex Right Carotid Findings:  +----------+--------+--------+--------+------------------+--------+           PSV cm/sEDV cm/sStenosisPlaque DescriptionComments  +----------+--------+--------+--------+------------------+--------+  CCA Prox  77      12                                          +----------+--------+--------+--------+------------------+--------+  CCA Mid   63      13                                          +----------+--------+--------+--------+------------------+--------+  CCA Distal62      13              heterogenous                +----------+--------+--------+--------+------------------+--------+  ICA Prox  39      10              heterogenous                +----------+--------+--------+--------+------------------+--------+  ICA Mid   123     43      40-59%                              +----------+--------+--------+--------+------------------+--------+  ICA Distal90      5                                           +----------+--------+--------+--------+------------------+--------+  ECA      105     17              heterogenous                +----------+--------+--------+--------+------------------+--------+    +----------+--------+-------+----------------+-------------------+           PSV cm/sEDV cmsDescribe        Arm Pressure (mmHG)  +----------+--------+-------+----------------+-------------------+  [id number]    2      Multiphasic, [id number]                  +----------+--------+-------+----------------+-------------------+   +---------+--------+--+--------+--+---------+  VertebralPSV cm/s44EDV cm/s13Antegrade  +---------+--------+--+--------+--+---------+      Left Carotid Findings:  +----------+--------+--------+--------+------------------+--------+           PSV cm/sEDV cm/sStenosisPlaque DescriptionComments  +----------+--------+--------+--------+------------------+--------+  CCA Prox  88      14                                          +----------+--------+--------+--------+------------------+--------+  CCA Mid   61      17                                          +----------+--------+--------+--------+------------------+--------+  CCA Distal48      12              heterogenous                +----------+--------+--------+--------+------------------+--------+  ICA Prox  38      12      1 - 39  heterogenous                +----------+--------+--------+--------+------------------+--------+  ICA Mid   73      26                                          +----------+--------+--------+--------+------------------+--------+  ICA Distal50      21                                          +----------+--------+--------+--------+------------------+--------+  ECA      90      4                                           +----------+--------+--------+--------+------------------+--------+   +----------+--------+--------+----------------+-------------------+           PSV cm/sEDV cm/sDescribe        Arm Pressure (mmHG)  +----------+--------+--------+----------------+-------------------+  [id number]    6        Multiphasic, [id number]                  +----------+--------+--------+----------------+-------------------+   +---------+--------+--+--------+--+---------+  VertebralPSV cm/s35EDV cm/s13Antegrade  +---------+--------+--+--------+--+---------+        Assessment/Plan:     [name] Williams is a 73 y.o. male presenting for carotid artery stenosis evaluation after a TIA with right-sided numbness in February.  I explained that I reviewed the CTA which does not demonstrate any significant stenosis.  He does have some mild plaque at the carotid bulbs but this is not causing any flow limiting stenosis.  The duplex today also demonstrates minimal stenosis, right 40 to 59% although very low range and left 1 to 39%.  Recommend continue to workup other causes of neurologic events.  Will plan to follow-up in 12 months with a repeat carotid duplex for annual surveillance.     Philipp Brawn MD [facility]

## 2023-06-12 ENCOUNTER — Ambulatory Visit: Attending: Vascular Surgery | Admitting: Vascular Surgery

## 2023-06-12 ENCOUNTER — Ambulatory Visit (HOSPITAL_COMMUNITY)
Admission: RE | Admit: 2023-06-12 | Discharge: 2023-06-12 | Disposition: A | Discharge status: Home / Self Care | Source: Ambulatory Visit | Attending: Vascular Surgery

## 2023-06-12 ENCOUNTER — Encounter: Payer: Self-pay | Admitting: Vascular Surgery

## 2023-06-12 VITALS — BP 126/69 | HR 106 | Temp 98.0°F | Resp 20 | Ht 71.0 in | Wt 283.4 lb

## 2023-06-12 DIAGNOSIS — I6523 Occlusion and stenosis of bilateral carotid arteries: Secondary | ICD-10-CM | POA: Insufficient documentation

## 2023-06-14 ENCOUNTER — Other Ambulatory Visit: Payer: Self-pay | Admitting: Oncology

## 2023-06-14 DIAGNOSIS — D500 Iron deficiency anemia secondary to blood loss (chronic): Secondary | ICD-10-CM

## 2023-06-14 NOTE — Progress Notes (Unsigned)
 [facility]  [address] [place],  [place]  [address] [phone]  Clinic Day:  06/15/2023  Referring physician: Adrian Hopper, MD   HISTORY OF PRESENT ILLNESS:  The patient is a 73 y.o. male with iron  deficiency anemia.  He comes in today to reassess his labs after receiving IV iron  in January 2025.  Since receiving his IV iron , the patient claims to be feeling better.  He still takes an iron  pill on a daily basis.  He believes he has noticed blood sporadically in his stool over the past few months.  PHYSICAL EXAM:  Blood pressure 137/89, pulse 87, temperature 98 F (36.7 C), temperature source Oral, resp. rate 16, height 5\' 11"  (1.803 m), weight 282 lb 1.6 oz (128 kg), SpO2 98%. Wt Readings from Last 3 Encounters:  06/15/23 282 lb 1.6 oz (128 kg)  06/12/23 283 lb 6.4 oz (128.5 kg)  02/18/23 288 lb 12 oz (131 kg)   Body mass index is 39.34 kg/m. Performance status (ECOG): 2 - Symptomatic, <50% confined to bed Physical Exam Constitutional:      Appearance: Normal appearance. He is obese. He is not ill-appearing.  HENT:     Mouth/Throat:     Mouth: Mucous membranes are moist.     Pharynx: Oropharynx is clear. No oropharyngeal exudate or posterior oropharyngeal erythema.  Cardiovascular:     Rate and Rhythm: Normal rate and regular rhythm.     Heart sounds: No murmur heard.    No friction rub. No gallop.  Pulmonary:     Effort: Pulmonary effort is normal. No respiratory distress.     Breath sounds: Normal breath sounds. No wheezing, rhonchi or rales.  Abdominal:     General: Bowel sounds are normal. There is no distension.     Palpations: Abdomen is soft. There is no mass.     Tenderness: There is no abdominal tenderness.  Musculoskeletal:        General: No swelling.     Right lower leg: No edema.     Left lower leg: No edema.  Lymphadenopathy:     Cervical: No cervical adenopathy.     Upper Body:     Right upper body: No  supraclavicular or axillary adenopathy.     Left upper body: No supraclavicular or axillary adenopathy.     Lower Body: No right inguinal adenopathy. No left inguinal adenopathy.  Skin:    General: Skin is warm.     Coloration: Skin is not jaundiced.     Findings: No lesion or rash.  Neurological:     General: No focal deficit present.     Mental Status: He is alert and oriented to person, place, and time. Mental status is at baseline.  Psychiatric:        Mood and Affect: Mood normal.        Behavior: Behavior normal.        Thought Content: Thought content normal.    LABS:       Latest Ref Rng & Units 06/15/2023    2:41 PM 02/09/2023   10:25 AM 10/09/2022   12:00 AM  CBC  WBC 4.0 - 10.5 K/uL 10.4  8.1  8.3      Hemoglobin 13.0 - 17.0 g/dL 91.4  78.2  95.6      Hematocrit 39.0 - 52.0 % 37.9  36.5  36      Platelets 150 - 400 K/uL 326  291  307  This result is from an external source.      Latest Ref Rng & Units 06/15/2023    2:41 PM 09/25/2022   11:55 AM 07/26/2020    2:28 PM  CMP  Glucose 70 - 99 mg/dL 161  096  045   BUN 8 - 23 mg/dL 32  24  18   Creatinine 0.61 - 1.24 mg/dL 4.09  8.11  9.14   Sodium 135 - 145 mmol/L 136  136  139   Potassium 3.5 - 5.1 mmol/L 4.0  3.8  4.7   Chloride 98 - 111 mmol/L 95  97  100   CO2 22 - 32 mmol/L 25  29  22    Calcium  8.9 - 10.3 mg/dL 78.2  9.8  9.9   Total Protein 6.5 - 8.1 g/dL 8.0  8.0    Total Bilirubin 0.0 - 1.2 mg/dL 0.2  0.3    Alkaline Phos 38 - 126 U/L 74  67    AST 15 - 41 U/L 25  48    ALT 0 - 44 U/L 19  37     ASSESSMENT & PLAN:  A 73 y.o. male with iron  deficiency anemia.  Despite him receiving IV iron  recently, there has not been any significant improvement in his hemoglobin.  His iron  parameters today show   Based upon this, I will  I would like for this patient to be referred back to GI as I am concerned there is some GI source behind his mildly persistent anemia.  Otherwise, I will see this patient back  in 4 months for repeat clinical assessment. The patient understands all the plans discussed today and is in agreement with them.  Rashied Corallo Felicia Horde, MD

## 2023-06-15 ENCOUNTER — Telehealth: Payer: Self-pay | Admitting: Oncology

## 2023-06-15 ENCOUNTER — Inpatient Hospital Stay (HOSPITAL_BASED_OUTPATIENT_CLINIC_OR_DEPARTMENT_OTHER): Admitting: Oncology

## 2023-06-15 ENCOUNTER — Other Ambulatory Visit: Payer: Self-pay | Admitting: Oncology

## 2023-06-15 ENCOUNTER — Inpatient Hospital Stay

## 2023-06-15 VITALS — BP 137/89 | HR 87 | Temp 98.0°F | Resp 16 | Ht 71.0 in | Wt 282.1 lb

## 2023-06-15 DIAGNOSIS — E669 Obesity, unspecified: Secondary | ICD-10-CM | POA: Diagnosis not present

## 2023-06-15 DIAGNOSIS — D500 Iron deficiency anemia secondary to blood loss (chronic): Secondary | ICD-10-CM | POA: Diagnosis not present

## 2023-06-15 DIAGNOSIS — D509 Iron deficiency anemia, unspecified: Secondary | ICD-10-CM | POA: Diagnosis not present

## 2023-06-15 DIAGNOSIS — Z79899 Other long term (current) drug therapy: Secondary | ICD-10-CM | POA: Diagnosis not present

## 2023-06-15 LAB — IRON AND TIBC
Iron: 65 ug/dL (ref 45–182)
Saturation Ratios: 10 % — ABNORMAL LOW (ref 17.9–39.5)
TIBC: 633 ug/dL — ABNORMAL HIGH (ref 250–450)
UIBC: 568 ug/dL

## 2023-06-15 LAB — CMP (CANCER CENTER ONLY)
ALT: 19 U/L (ref 0–44)
AST: 25 U/L (ref 15–41)
Albumin: 4.4 g/dL (ref 3.5–5.0)
Alkaline Phosphatase: 74 U/L (ref 38–126)
Anion gap: 16 — ABNORMAL HIGH (ref 5–15)
BUN: 32 mg/dL — ABNORMAL HIGH (ref 8–23)
CO2: 25 mmol/L (ref 22–32)
Calcium: 10.8 mg/dL — ABNORMAL HIGH (ref 8.9–10.3)
Chloride: 95 mmol/L — ABNORMAL LOW (ref 98–111)
Creatinine: 1.25 mg/dL — ABNORMAL HIGH (ref 0.61–1.24)
GFR, Estimated: >60 mL/min (ref >60)
Glucose, Bld: 204 mg/dL — ABNORMAL HIGH (ref 70–99)
Potassium: 4 mmol/L (ref 3.5–5.1)
Sodium: 136 mmol/L (ref 135–145)
Total Bilirubin: 0.2 mg/dL (ref 0.0–1.2)
Total Protein: 8 g/dL (ref 6.5–8.1)

## 2023-06-15 LAB — CBC WITH DIFFERENTIAL (CANCER CENTER ONLY)
Abs Immature Granulocytes: 0.04 10*3/uL (ref 0.00–0.07)
Basophils Absolute: 0 10*3/uL (ref 0.0–0.1)
Basophils Relative: 0 %
Eosinophils Absolute: 0.4 10*3/uL (ref 0.0–0.5)
Eosinophils Relative: 4 %
HCT: 37.9 % — ABNORMAL LOW (ref 39.0–52.0)
Hemoglobin: 12.4 g/dL — ABNORMAL LOW (ref 13.0–17.0)
Immature Granulocytes: 0 %
Lymphocytes Relative: 22 %
Lymphs Abs: 2.3 10*3/uL (ref 0.7–4.0)
MCH: 29.2 pg (ref 26.0–34.0)
MCHC: 32.7 g/dL (ref 30.0–36.0)
MCV: 89.2 fL (ref 80.0–100.0)
Monocytes Absolute: 0.9 10*3/uL (ref 0.1–1.0)
Monocytes Relative: 9 %
Neutro Abs: 6.8 10*3/uL (ref 1.7–7.7)
Neutrophils Relative %: 65 %
Platelet Count: 326 10*3/uL (ref 150–400)
RBC: 4.25 MIL/uL (ref 4.22–5.81)
RDW: 13.5 % (ref 11.5–15.5)
WBC Count: 10.4 10*3/uL (ref 4.0–10.5)
nRBC: 0 % (ref 0.0–0.2)

## 2023-06-15 LAB — FERRITIN: Ferritin: 20 ng/mL — ABNORMAL LOW (ref 24–336)

## 2023-06-15 NOTE — Telephone Encounter (Signed)
 Patient has been scheduled for follow-up visit per 06/15/23 LOS.  Pt given an appt calendar with date and time.

## 2023-06-16 ENCOUNTER — Encounter: Payer: Self-pay | Admitting: Oncology

## 2023-06-16 ENCOUNTER — Telehealth: Payer: Self-pay

## 2023-06-16 NOTE — Telephone Encounter (Signed)
 Latest Reference Range & Units 06/15/23 14:41  Iron  45 - 182 ug/dL 65  UIBC ug/dL 409  TIBC 811 - 914 ug/dL 782 (H)  Saturation Ratios 17.9 - 39.5 % 10 (L)  Ferritin 24 - 336 ng/mL 20 (L)  (H): Data is abnormally high (L): Data is abnormally low

## 2023-06-18 ENCOUNTER — Telehealth: Payer: Self-pay | Admitting: Oncology

## 2023-06-18 NOTE — Telephone Encounter (Signed)
 Patient has been scheduled for follow-up visit per 06/15/23 LOS.  Pt aware of scheduled appt details.

## 2023-06-22 ENCOUNTER — Inpatient Hospital Stay

## 2023-06-22 VITALS — BP 166/76 | HR 85 | Temp 97.6°F | Resp 18 | Ht 71.0 in | Wt 284.8 lb

## 2023-06-22 DIAGNOSIS — D509 Iron deficiency anemia, unspecified: Secondary | ICD-10-CM | POA: Diagnosis not present

## 2023-06-22 DIAGNOSIS — Z79899 Other long term (current) drug therapy: Secondary | ICD-10-CM | POA: Diagnosis not present

## 2023-06-22 DIAGNOSIS — D500 Iron deficiency anemia secondary to blood loss (chronic): Secondary | ICD-10-CM

## 2023-06-22 DIAGNOSIS — E669 Obesity, unspecified: Secondary | ICD-10-CM | POA: Diagnosis not present

## 2023-06-22 MED ORDER — IRON SUCROSE 20 MG/ML IV SOLN
200.0000 mg | Freq: Once | INTRAVENOUS | Status: AC
  Administered 2023-06-22: 200 mg via INTRAVENOUS
  Filled 2023-06-22: qty 10

## 2023-06-22 NOTE — Patient Instructions (Signed)

## 2023-06-24 ENCOUNTER — Inpatient Hospital Stay

## 2023-06-24 VITALS — BP 100/67 | HR 85 | Temp 98.0°F | Resp 20

## 2023-06-24 DIAGNOSIS — D500 Iron deficiency anemia secondary to blood loss (chronic): Secondary | ICD-10-CM

## 2023-06-24 DIAGNOSIS — D509 Iron deficiency anemia, unspecified: Secondary | ICD-10-CM | POA: Diagnosis not present

## 2023-06-24 DIAGNOSIS — L089 Local infection of the skin and subcutaneous tissue, unspecified: Secondary | ICD-10-CM | POA: Diagnosis not present

## 2023-06-24 DIAGNOSIS — E669 Obesity, unspecified: Secondary | ICD-10-CM | POA: Diagnosis not present

## 2023-06-24 DIAGNOSIS — S81801A Unspecified open wound, right lower leg, initial encounter: Secondary | ICD-10-CM | POA: Diagnosis not present

## 2023-06-24 DIAGNOSIS — Z79899 Other long term (current) drug therapy: Secondary | ICD-10-CM | POA: Diagnosis not present

## 2023-06-24 DIAGNOSIS — S81802A Unspecified open wound, left lower leg, initial encounter: Secondary | ICD-10-CM | POA: Diagnosis not present

## 2023-06-24 MED ORDER — SODIUM CHLORIDE 0.9 % IV SOLN: INTRAVENOUS | Status: DC

## 2023-06-24 MED ORDER — IRON SUCROSE 20 MG/ML IV SOLN
200.0000 mg | Freq: Once | INTRAVENOUS | Status: AC
  Administered 2023-06-24: 200 mg via INTRAVENOUS
  Filled 2023-06-24: qty 10

## 2023-06-24 NOTE — Patient Instructions (Addendum)

## 2023-06-26 ENCOUNTER — Inpatient Hospital Stay

## 2023-06-26 DIAGNOSIS — R350 Frequency of micturition: Secondary | ICD-10-CM | POA: Diagnosis not present

## 2023-06-26 DIAGNOSIS — N401 Enlarged prostate with lower urinary tract symptoms: Secondary | ICD-10-CM | POA: Diagnosis not present

## 2023-06-26 DIAGNOSIS — R351 Nocturia: Secondary | ICD-10-CM | POA: Diagnosis not present

## 2023-06-26 DIAGNOSIS — C676 Malignant neoplasm of ureteric orifice: Secondary | ICD-10-CM | POA: Diagnosis not present

## 2023-06-30 ENCOUNTER — Inpatient Hospital Stay

## 2023-06-30 VITALS — BP 141/79 | HR 83 | Temp 97.8°F | Resp 18 | Ht 71.0 in | Wt 283.1 lb

## 2023-06-30 DIAGNOSIS — D509 Iron deficiency anemia, unspecified: Secondary | ICD-10-CM | POA: Diagnosis not present

## 2023-06-30 DIAGNOSIS — Z79899 Other long term (current) drug therapy: Secondary | ICD-10-CM | POA: Diagnosis not present

## 2023-06-30 DIAGNOSIS — E669 Obesity, unspecified: Secondary | ICD-10-CM | POA: Diagnosis not present

## 2023-06-30 DIAGNOSIS — S81801D Unspecified open wound, right lower leg, subsequent encounter: Secondary | ICD-10-CM | POA: Diagnosis not present

## 2023-06-30 DIAGNOSIS — D500 Iron deficiency anemia secondary to blood loss (chronic): Secondary | ICD-10-CM

## 2023-06-30 MED ORDER — IRON SUCROSE 20 MG/ML IV SOLN
200.0000 mg | Freq: Once | INTRAVENOUS | Status: AC
  Administered 2023-06-30: 200 mg via INTRAVENOUS
  Filled 2023-06-30: qty 10

## 2023-06-30 MED ORDER — SODIUM CHLORIDE 0.9% FLUSH
10.0000 mL | Freq: Once | INTRAVENOUS | Status: AC | PRN
  Administered 2023-06-30: 10 mL

## 2023-06-30 NOTE — Patient Instructions (Signed)

## 2023-07-01 DIAGNOSIS — J069 Acute upper respiratory infection, unspecified: Secondary | ICD-10-CM | POA: Diagnosis not present

## 2023-07-02 ENCOUNTER — Inpatient Hospital Stay

## 2023-07-02 VITALS — BP 112/65 | HR 92 | Temp 97.5°F | Resp 20

## 2023-07-02 DIAGNOSIS — Z79899 Other long term (current) drug therapy: Secondary | ICD-10-CM | POA: Diagnosis not present

## 2023-07-02 DIAGNOSIS — D500 Iron deficiency anemia secondary to blood loss (chronic): Secondary | ICD-10-CM

## 2023-07-02 DIAGNOSIS — D509 Iron deficiency anemia, unspecified: Secondary | ICD-10-CM | POA: Diagnosis not present

## 2023-07-02 DIAGNOSIS — E669 Obesity, unspecified: Secondary | ICD-10-CM | POA: Diagnosis not present

## 2023-07-02 MED ORDER — IRON SUCROSE 20 MG/ML IV SOLN
200.0000 mg | Freq: Once | INTRAVENOUS | Status: AC
  Administered 2023-07-02: 200 mg via INTRAVENOUS
  Filled 2023-07-02: qty 10

## 2023-07-02 NOTE — Patient Instructions (Signed)

## 2023-07-06 ENCOUNTER — Inpatient Hospital Stay: Attending: Oncology

## 2023-07-06 VITALS — BP 145/82 | HR 70 | Temp 97.7°F | Resp 20

## 2023-07-06 DIAGNOSIS — E669 Obesity, unspecified: Secondary | ICD-10-CM | POA: Insufficient documentation

## 2023-07-06 DIAGNOSIS — Z79899 Other long term (current) drug therapy: Secondary | ICD-10-CM | POA: Insufficient documentation

## 2023-07-06 DIAGNOSIS — D509 Iron deficiency anemia, unspecified: Secondary | ICD-10-CM | POA: Diagnosis not present

## 2023-07-06 DIAGNOSIS — D500 Iron deficiency anemia secondary to blood loss (chronic): Secondary | ICD-10-CM

## 2023-07-06 MED ORDER — SODIUM CHLORIDE 0.9 % IV SOLN: INTRAVENOUS | Status: DC

## 2023-07-06 MED ORDER — IRON SUCROSE 20 MG/ML IV SOLN
200.0000 mg | Freq: Once | INTRAVENOUS | Status: AC
  Administered 2023-07-06: 200 mg via INTRAVENOUS
  Filled 2023-07-06: qty 10

## 2023-07-06 NOTE — Patient Instructions (Signed)

## 2023-07-08 DIAGNOSIS — S81801D Unspecified open wound, right lower leg, subsequent encounter: Secondary | ICD-10-CM | POA: Diagnosis not present

## 2023-07-09 DIAGNOSIS — F419 Anxiety disorder, unspecified: Secondary | ICD-10-CM | POA: Diagnosis not present

## 2023-07-09 DIAGNOSIS — K219 Gastro-esophageal reflux disease without esophagitis: Secondary | ICD-10-CM | POA: Diagnosis not present

## 2023-07-09 DIAGNOSIS — I69392 Facial weakness following cerebral infarction: Secondary | ICD-10-CM | POA: Diagnosis not present

## 2023-07-09 DIAGNOSIS — I509 Heart failure, unspecified: Secondary | ICD-10-CM | POA: Diagnosis not present

## 2023-07-09 DIAGNOSIS — I69391 Dysphagia following cerebral infarction: Secondary | ICD-10-CM | POA: Diagnosis not present

## 2023-07-09 DIAGNOSIS — I440 Atrioventricular block, first degree: Secondary | ICD-10-CM | POA: Diagnosis not present

## 2023-07-09 DIAGNOSIS — I6523 Occlusion and stenosis of bilateral carotid arteries: Secondary | ICD-10-CM | POA: Diagnosis not present

## 2023-07-09 DIAGNOSIS — K449 Diaphragmatic hernia without obstruction or gangrene: Secondary | ICD-10-CM | POA: Diagnosis not present

## 2023-07-09 DIAGNOSIS — N401 Enlarged prostate with lower urinary tract symptoms: Secondary | ICD-10-CM | POA: Diagnosis not present

## 2023-07-09 DIAGNOSIS — C676 Malignant neoplasm of ureteric orifice: Secondary | ICD-10-CM | POA: Diagnosis not present

## 2023-07-09 DIAGNOSIS — I69354 Hemiplegia and hemiparesis following cerebral infarction affecting left non-dominant side: Secondary | ICD-10-CM | POA: Diagnosis not present

## 2023-07-09 DIAGNOSIS — H9190 Unspecified hearing loss, unspecified ear: Secondary | ICD-10-CM | POA: Diagnosis not present

## 2023-07-09 DIAGNOSIS — N3289 Other specified disorders of bladder: Secondary | ICD-10-CM | POA: Diagnosis not present

## 2023-07-09 DIAGNOSIS — C672 Malignant neoplasm of lateral wall of bladder: Secondary | ICD-10-CM | POA: Diagnosis not present

## 2023-07-09 DIAGNOSIS — I110 Hypertensive heart disease with heart failure: Secondary | ICD-10-CM | POA: Diagnosis not present

## 2023-07-09 DIAGNOSIS — M109 Gout, unspecified: Secondary | ICD-10-CM | POA: Diagnosis not present

## 2023-07-09 DIAGNOSIS — F32A Depression, unspecified: Secondary | ICD-10-CM | POA: Diagnosis not present

## 2023-07-09 DIAGNOSIS — D500 Iron deficiency anemia secondary to blood loss (chronic): Secondary | ICD-10-CM | POA: Diagnosis not present

## 2023-07-09 DIAGNOSIS — E1142 Type 2 diabetes mellitus with diabetic polyneuropathy: Secondary | ICD-10-CM | POA: Diagnosis not present

## 2023-07-09 DIAGNOSIS — E782 Mixed hyperlipidemia: Secondary | ICD-10-CM | POA: Diagnosis not present

## 2023-07-09 DIAGNOSIS — M1990 Unspecified osteoarthritis, unspecified site: Secondary | ICD-10-CM | POA: Diagnosis not present

## 2023-07-09 DIAGNOSIS — R350 Frequency of micturition: Secondary | ICD-10-CM | POA: Diagnosis not present

## 2023-07-09 DIAGNOSIS — J4489 Other specified chronic obstructive pulmonary disease: Secondary | ICD-10-CM | POA: Diagnosis not present

## 2023-07-09 DIAGNOSIS — I2510 Atherosclerotic heart disease of native coronary artery without angina pectoris: Secondary | ICD-10-CM | POA: Diagnosis not present

## 2023-07-09 DIAGNOSIS — G4733 Obstructive sleep apnea (adult) (pediatric): Secondary | ICD-10-CM | POA: Diagnosis not present

## 2023-07-09 DIAGNOSIS — I4891 Unspecified atrial fibrillation: Secondary | ICD-10-CM | POA: Diagnosis not present

## 2023-07-09 DIAGNOSIS — R351 Nocturia: Secondary | ICD-10-CM | POA: Diagnosis not present

## 2023-07-09 DIAGNOSIS — I10 Essential (primary) hypertension: Secondary | ICD-10-CM | POA: Diagnosis not present

## 2023-07-10 ENCOUNTER — Encounter (HOSPITAL_COMMUNITY)

## 2023-07-10 ENCOUNTER — Encounter: Admitting: Vascular Surgery

## 2023-07-15 DIAGNOSIS — K2100 Gastro-esophageal reflux disease with esophagitis, without bleeding: Secondary | ICD-10-CM | POA: Diagnosis not present

## 2023-07-15 DIAGNOSIS — G4733 Obstructive sleep apnea (adult) (pediatric): Secondary | ICD-10-CM | POA: Diagnosis not present

## 2023-07-29 DIAGNOSIS — S81801D Unspecified open wound, right lower leg, subsequent encounter: Secondary | ICD-10-CM | POA: Diagnosis not present

## 2023-08-04 DIAGNOSIS — R5383 Other fatigue: Secondary | ICD-10-CM | POA: Diagnosis not present

## 2023-08-04 DIAGNOSIS — R0683 Snoring: Secondary | ICD-10-CM | POA: Diagnosis not present

## 2023-08-11 DIAGNOSIS — I69354 Hemiplegia and hemiparesis following cerebral infarction affecting left non-dominant side: Secondary | ICD-10-CM | POA: Diagnosis not present

## 2023-08-11 DIAGNOSIS — S81801D Unspecified open wound, right lower leg, subsequent encounter: Secondary | ICD-10-CM | POA: Diagnosis not present

## 2023-08-11 DIAGNOSIS — E66812 Obesity, class 2: Secondary | ICD-10-CM | POA: Diagnosis not present

## 2023-08-11 DIAGNOSIS — I10 Essential (primary) hypertension: Secondary | ICD-10-CM | POA: Diagnosis not present

## 2023-08-11 DIAGNOSIS — E785 Hyperlipidemia, unspecified: Secondary | ICD-10-CM | POA: Diagnosis not present

## 2023-08-11 DIAGNOSIS — N401 Enlarged prostate with lower urinary tract symptoms: Secondary | ICD-10-CM | POA: Diagnosis not present

## 2023-08-11 DIAGNOSIS — I6523 Occlusion and stenosis of bilateral carotid arteries: Secondary | ICD-10-CM | POA: Diagnosis not present

## 2023-08-11 DIAGNOSIS — D509 Iron deficiency anemia, unspecified: Secondary | ICD-10-CM | POA: Diagnosis not present

## 2023-08-11 DIAGNOSIS — E1142 Type 2 diabetes mellitus with diabetic polyneuropathy: Secondary | ICD-10-CM | POA: Diagnosis not present

## 2023-08-11 DIAGNOSIS — M109 Gout, unspecified: Secondary | ICD-10-CM | POA: Diagnosis not present

## 2023-08-11 DIAGNOSIS — I25118 Atherosclerotic heart disease of native coronary artery with other forms of angina pectoris: Secondary | ICD-10-CM | POA: Diagnosis not present

## 2023-08-11 DIAGNOSIS — R600 Localized edema: Secondary | ICD-10-CM | POA: Diagnosis not present

## 2023-08-11 DIAGNOSIS — G459 Transient cerebral ischemic attack, unspecified: Secondary | ICD-10-CM | POA: Diagnosis not present

## 2023-09-14 DIAGNOSIS — R0600 Dyspnea, unspecified: Secondary | ICD-10-CM | POA: Diagnosis not present

## 2023-09-14 DIAGNOSIS — R0683 Snoring: Secondary | ICD-10-CM | POA: Diagnosis not present

## 2023-09-14 DIAGNOSIS — R5383 Other fatigue: Secondary | ICD-10-CM | POA: Diagnosis not present

## 2023-09-19 DIAGNOSIS — G4733 Obstructive sleep apnea (adult) (pediatric): Secondary | ICD-10-CM | POA: Diagnosis not present

## 2023-09-28 DIAGNOSIS — R079 Chest pain, unspecified: Secondary | ICD-10-CM | POA: Diagnosis not present

## 2023-10-15 NOTE — Progress Notes (Deleted)
 [facility]  [address] [place],  [place]  [address] [phone]  Clinic Day:  10/15/2023  Referring physician: Keren Vicenta BRAVO, MD   HISTORY OF PRESENT ILLNESS:  The patient is a 73 y.o. male with recurrent iron  deficiency anemia.  He comes in today to reassess his labs after receiving IV iron  in May/June 2025.  He also received IV iron  in January 2025.    GI visit  Since receiving his IV iron , the patient claims to be feeling better.  He still takes an iron  pill on a daily basis.  He believes he has noticed blood sporadically in his stool over the past few months.  PHYSICAL EXAM:  There were no vitals taken for this visit. Wt Readings from Last 3 Encounters:  06/30/23 283 lb 1.9 oz (128.4 kg)  06/22/23 284 lb 12 oz (129.2 kg)  06/15/23 282 lb 1.6 oz (128 kg)   There is no height or weight on file to calculate BMI. Performance status (ECOG): 2 - Symptomatic, <50% confined to bed Physical Exam Constitutional:      Appearance: Normal appearance. He is obese. He is not ill-appearing.  HENT:     Mouth/Throat:     Mouth: Mucous membranes are moist.     Pharynx: Oropharynx is clear. No oropharyngeal exudate or posterior oropharyngeal erythema.  Cardiovascular:     Rate and Rhythm: Normal rate and regular rhythm.     Heart sounds: No murmur heard.    No friction rub. No gallop.  Pulmonary:     Effort: Pulmonary effort is normal. No respiratory distress.     Breath sounds: Normal breath sounds. No wheezing, rhonchi or rales.  Abdominal:     General: Bowel sounds are normal. There is no distension.     Palpations: Abdomen is soft. There is no mass.     Tenderness: There is no abdominal tenderness.  Musculoskeletal:        General: No swelling.     Right lower leg: No edema.     Left lower leg: No edema.  Lymphadenopathy:     Cervical: No cervical adenopathy.     Upper Body:     Right upper body: No supraclavicular or axillary  adenopathy.     Left upper body: No supraclavicular or axillary adenopathy.     Lower Body: No right inguinal adenopathy. No left inguinal adenopathy.  Skin:    General: Skin is warm.     Coloration: Skin is not jaundiced.     Findings: No lesion or rash.  Neurological:     General: No focal deficit present.     Mental Status: He is alert and oriented to person, place, and time. Mental status is at baseline.  Psychiatric:        Mood and Affect: Mood normal.        Behavior: Behavior normal.        Thought Content: Thought content normal.     LABS:       Latest Ref Rng & Units 06/15/2023    2:41 PM 02/09/2023   10:25 AM 10/09/2022   12:00 AM  CBC  WBC 4.0 - 10.5 K/uL 10.4  8.1  8.3      Hemoglobin 13.0 - 17.0 g/dL 87.5  87.9  88.2      Hematocrit 39.0 - 52.0 % 37.9  36.5  36      Platelets 150 - 400 K/uL 326  291  307  This result is from an external source.      Latest Ref Rng & Units 06/15/2023    2:41 PM 09/25/2022   11:55 AM 07/26/2020    2:28 PM  CMP  Glucose 70 - 99 mg/dL 795  869  793   BUN 8 - 23 mg/dL 32  24  18   Creatinine 0.61 - 1.24 mg/dL 8.74  8.96  9.12   Sodium 135 - 145 mmol/L 136  136  139   Potassium 3.5 - 5.1 mmol/L 4.0  3.8  4.7   Chloride 98 - 111 mmol/L 95  97  100   CO2 22 - 32 mmol/L 25  29  22    Calcium  8.9 - 10.3 mg/dL 89.1  9.8  9.9   Total Protein 6.5 - 8.1 g/dL 8.0  8.0    Total Bilirubin 0.0 - 1.2 mg/dL 0.2  0.3    Alkaline Phos 38 - 126 U/L 74  67    AST 15 - 41 U/L 25  48    ALT 0 - 44 U/L 19  37      Latest Reference Range & Units 06/15/23 14:41  Iron  45 - 182 ug/dL 65  UIBC ug/dL 431  TIBC 749 - 549 ug/dL 366 (H)  Saturation Ratios 17.9 - 39.5 % 10 (L)  Ferritin 24 - 336 ng/mL 20 (L)  (H): Data is abnormally high (L): Data is abnormally low  ASSESSMENT & PLAN:  A 73 y.o. male with iron  deficiency anemia.  Despite him receiving IV iron  recently, there really was not a significant improvement in his hemoglobin.   Furthermore, his iron  parameters today show that he is still very iron  deficient.  Based upon this, I will arrange for him to receive another course of IV iron  over these next few weeks.  I will refer this gentleman back to GI as I am concerned there is some GI source behind his persistent iron  deficiency anemia.  Otherwise, I will see this patient back in 4 months for repeat clinical assessment. The patient understands all the plans discussed today and is in agreement with them.  Jasie Meleski DELENA Kerns, MD

## 2023-10-16 ENCOUNTER — Inpatient Hospital Stay: Admitting: Oncology

## 2023-10-16 ENCOUNTER — Inpatient Hospital Stay

## 2023-10-20 DIAGNOSIS — R351 Nocturia: Secondary | ICD-10-CM | POA: Diagnosis not present

## 2023-10-20 DIAGNOSIS — N401 Enlarged prostate with lower urinary tract symptoms: Secondary | ICD-10-CM | POA: Diagnosis not present

## 2023-10-24 ENCOUNTER — Emergency Department (HOSPITAL_COMMUNITY)

## 2023-10-24 ENCOUNTER — Encounter (HOSPITAL_COMMUNITY): Payer: Self-pay | Admitting: *Deleted

## 2023-10-24 ENCOUNTER — Inpatient Hospital Stay (HOSPITAL_COMMUNITY)
Admission: EM | Admit: 2023-10-24 | Discharge: 2023-10-29 | DRG: 291 | Disposition: A | Discharge status: Home / Self Care | Attending: Internal Medicine | Admitting: Internal Medicine

## 2023-10-24 ENCOUNTER — Other Ambulatory Visit: Payer: Self-pay

## 2023-10-24 DIAGNOSIS — G4700 Insomnia, unspecified: Secondary | ICD-10-CM | POA: Diagnosis present

## 2023-10-24 DIAGNOSIS — F419 Anxiety disorder, unspecified: Secondary | ICD-10-CM | POA: Diagnosis present

## 2023-10-24 DIAGNOSIS — I2510 Atherosclerotic heart disease of native coronary artery without angina pectoris: Secondary | ICD-10-CM | POA: Diagnosis present

## 2023-10-24 DIAGNOSIS — Z888 Allergy status to other drugs, medicaments and biological substances status: Secondary | ICD-10-CM

## 2023-10-24 DIAGNOSIS — M40204 Unspecified kyphosis, thoracic region: Secondary | ICD-10-CM | POA: Diagnosis not present

## 2023-10-24 DIAGNOSIS — F05 Delirium due to known physiological condition: Secondary | ICD-10-CM | POA: Diagnosis present

## 2023-10-24 DIAGNOSIS — R0603 Acute respiratory distress: Secondary | ICD-10-CM | POA: Diagnosis not present

## 2023-10-24 DIAGNOSIS — K3189 Other diseases of stomach and duodenum: Secondary | ICD-10-CM | POA: Diagnosis present

## 2023-10-24 DIAGNOSIS — Z6839 Body mass index (BMI) 39.0-39.9, adult: Secondary | ICD-10-CM

## 2023-10-24 DIAGNOSIS — I4430 Unspecified atrioventricular block: Secondary | ICD-10-CM | POA: Diagnosis not present

## 2023-10-24 DIAGNOSIS — J449 Chronic obstructive pulmonary disease, unspecified: Secondary | ICD-10-CM | POA: Diagnosis present

## 2023-10-24 DIAGNOSIS — N400 Enlarged prostate without lower urinary tract symptoms: Secondary | ICD-10-CM | POA: Diagnosis present

## 2023-10-24 DIAGNOSIS — E669 Obesity, unspecified: Secondary | ICD-10-CM | POA: Insufficient documentation

## 2023-10-24 DIAGNOSIS — Z8551 Personal history of malignant neoplasm of bladder: Secondary | ICD-10-CM

## 2023-10-24 DIAGNOSIS — M109 Gout, unspecified: Secondary | ICD-10-CM | POA: Diagnosis present

## 2023-10-24 DIAGNOSIS — E118 Type 2 diabetes mellitus with unspecified complications: Secondary | ICD-10-CM | POA: Diagnosis present

## 2023-10-24 DIAGNOSIS — I5033 Acute on chronic diastolic (congestive) heart failure: Secondary | ICD-10-CM | POA: Diagnosis present

## 2023-10-24 DIAGNOSIS — E1142 Type 2 diabetes mellitus with diabetic polyneuropathy: Secondary | ICD-10-CM | POA: Diagnosis present

## 2023-10-24 DIAGNOSIS — E785 Hyperlipidemia, unspecified: Secondary | ICD-10-CM | POA: Diagnosis present

## 2023-10-24 DIAGNOSIS — K2950 Unspecified chronic gastritis without bleeding: Secondary | ICD-10-CM | POA: Diagnosis present

## 2023-10-24 DIAGNOSIS — R000 Tachycardia, unspecified: Secondary | ICD-10-CM | POA: Diagnosis present

## 2023-10-24 DIAGNOSIS — D62 Acute posthemorrhagic anemia: Secondary | ICD-10-CM | POA: Diagnosis present

## 2023-10-24 DIAGNOSIS — R0602 Shortness of breath: Secondary | ICD-10-CM | POA: Diagnosis not present

## 2023-10-24 DIAGNOSIS — R0902 Hypoxemia: Secondary | ICD-10-CM | POA: Diagnosis not present

## 2023-10-24 DIAGNOSIS — K648 Other hemorrhoids: Secondary | ICD-10-CM | POA: Diagnosis present

## 2023-10-24 DIAGNOSIS — Z801 Family history of malignant neoplasm of trachea, bronchus and lung: Secondary | ICD-10-CM

## 2023-10-24 DIAGNOSIS — I10 Essential (primary) hypertension: Secondary | ICD-10-CM | POA: Diagnosis present

## 2023-10-24 DIAGNOSIS — Z7982 Long term (current) use of aspirin: Secondary | ICD-10-CM

## 2023-10-24 DIAGNOSIS — K219 Gastro-esophageal reflux disease without esophagitis: Secondary | ICD-10-CM | POA: Diagnosis present

## 2023-10-24 DIAGNOSIS — F32A Depression, unspecified: Secondary | ICD-10-CM | POA: Diagnosis present

## 2023-10-24 DIAGNOSIS — I700 Atherosclerosis of aorta: Secondary | ICD-10-CM | POA: Diagnosis not present

## 2023-10-24 DIAGNOSIS — Z860100 Personal history of colon polyps, unspecified: Secondary | ICD-10-CM

## 2023-10-24 DIAGNOSIS — Z87891 Personal history of nicotine dependence: Secondary | ICD-10-CM

## 2023-10-24 DIAGNOSIS — K5520 Angiodysplasia of colon without hemorrhage: Secondary | ICD-10-CM

## 2023-10-24 DIAGNOSIS — K31811 Angiodysplasia of stomach and duodenum with bleeding: Secondary | ICD-10-CM | POA: Diagnosis present

## 2023-10-24 DIAGNOSIS — I517 Cardiomegaly: Secondary | ICD-10-CM | POA: Diagnosis not present

## 2023-10-24 DIAGNOSIS — I110 Hypertensive heart disease with heart failure: Principal | ICD-10-CM | POA: Diagnosis present

## 2023-10-24 DIAGNOSIS — D539 Nutritional anemia, unspecified: Secondary | ICD-10-CM | POA: Diagnosis present

## 2023-10-24 DIAGNOSIS — E1165 Type 2 diabetes mellitus with hyperglycemia: Secondary | ICD-10-CM | POA: Diagnosis present

## 2023-10-24 DIAGNOSIS — Z823 Family history of stroke: Secondary | ICD-10-CM

## 2023-10-24 DIAGNOSIS — Z8249 Family history of ischemic heart disease and other diseases of the circulatory system: Secondary | ICD-10-CM

## 2023-10-24 DIAGNOSIS — E876 Hypokalemia: Secondary | ICD-10-CM | POA: Diagnosis present

## 2023-10-24 DIAGNOSIS — Z7902 Long term (current) use of antithrombotics/antiplatelets: Secondary | ICD-10-CM

## 2023-10-24 DIAGNOSIS — J9601 Acute respiratory failure with hypoxia: Secondary | ICD-10-CM | POA: Diagnosis present

## 2023-10-24 DIAGNOSIS — Z8673 Personal history of transient ischemic attack (TIA), and cerebral infarction without residual deficits: Secondary | ICD-10-CM

## 2023-10-24 DIAGNOSIS — Z743 Need for continuous supervision: Secondary | ICD-10-CM | POA: Diagnosis not present

## 2023-10-24 DIAGNOSIS — Z91119 Patient's noncompliance with dietary regimen due to unspecified reason: Secondary | ICD-10-CM

## 2023-10-24 DIAGNOSIS — K922 Gastrointestinal hemorrhage, unspecified: Secondary | ICD-10-CM | POA: Diagnosis present

## 2023-10-24 DIAGNOSIS — Z800 Family history of malignant neoplasm of digestive organs: Secondary | ICD-10-CM

## 2023-10-24 DIAGNOSIS — Z833 Family history of diabetes mellitus: Secondary | ICD-10-CM

## 2023-10-24 DIAGNOSIS — D649 Anemia, unspecified: Secondary | ICD-10-CM | POA: Diagnosis present

## 2023-10-24 DIAGNOSIS — R0989 Other specified symptoms and signs involving the circulatory and respiratory systems: Secondary | ICD-10-CM | POA: Diagnosis not present

## 2023-10-24 DIAGNOSIS — Z7984 Long term (current) use of oral hypoglycemic drugs: Secondary | ICD-10-CM

## 2023-10-24 DIAGNOSIS — Z880 Allergy status to penicillin: Secondary | ICD-10-CM

## 2023-10-24 DIAGNOSIS — Z794 Long term (current) use of insulin: Secondary | ICD-10-CM

## 2023-10-24 DIAGNOSIS — K5521 Angiodysplasia of colon with hemorrhage: Secondary | ICD-10-CM | POA: Diagnosis present

## 2023-10-24 DIAGNOSIS — R195 Other fecal abnormalities: Secondary | ICD-10-CM

## 2023-10-24 DIAGNOSIS — I5031 Acute diastolic (congestive) heart failure: Secondary | ICD-10-CM

## 2023-10-24 DIAGNOSIS — Z79899 Other long term (current) drug therapy: Secondary | ICD-10-CM

## 2023-10-24 DIAGNOSIS — N179 Acute kidney failure, unspecified: Secondary | ICD-10-CM | POA: Diagnosis present

## 2023-10-24 DIAGNOSIS — I2489 Other forms of acute ischemic heart disease: Secondary | ICD-10-CM | POA: Diagnosis present

## 2023-10-24 DIAGNOSIS — D123 Benign neoplasm of transverse colon: Secondary | ICD-10-CM | POA: Diagnosis present

## 2023-10-24 DIAGNOSIS — Z91148 Patient's other noncompliance with medication regimen for other reason: Secondary | ICD-10-CM

## 2023-10-24 DIAGNOSIS — I4891 Unspecified atrial fibrillation: Secondary | ICD-10-CM | POA: Diagnosis present

## 2023-10-24 DIAGNOSIS — K264 Chronic or unspecified duodenal ulcer with hemorrhage: Secondary | ICD-10-CM | POA: Diagnosis present

## 2023-10-24 DIAGNOSIS — Z811 Family history of alcohol abuse and dependence: Secondary | ICD-10-CM

## 2023-10-24 DIAGNOSIS — G4733 Obstructive sleep apnea (adult) (pediatric): Secondary | ICD-10-CM | POA: Diagnosis present

## 2023-10-24 LAB — CBC
HCT: 24.9 % — ABNORMAL LOW (ref 39.0–52.0)
Hemoglobin: 7 g/dL — ABNORMAL LOW (ref 13.0–17.0)
MCH: 24.5 pg — ABNORMAL LOW (ref 26.0–34.0)
MCHC: 28.1 g/dL — ABNORMAL LOW (ref 30.0–36.0)
MCV: 87.1 fL (ref 80.0–100.0)
Platelets: 453 K/uL — ABNORMAL HIGH (ref 150–400)
RBC: 2.86 MIL/uL — ABNORMAL LOW (ref 4.22–5.81)
RDW: 15.9 % — ABNORMAL HIGH (ref 11.5–15.5)
WBC: 10.9 K/uL — ABNORMAL HIGH (ref 4.0–10.5)
nRBC: 0 % (ref 0.0–0.2)

## 2023-10-24 LAB — BASIC METABOLIC PANEL WITH GFR
Anion gap: 11 (ref 5–15)
BUN: 17 mg/dL (ref 8–23)
CO2: 21 mmol/L — ABNORMAL LOW (ref 22–32)
Calcium: 9 mg/dL (ref 8.9–10.3)
Chloride: 104 mmol/L (ref 98–111)
Creatinine, Ser: 1.27 mg/dL — ABNORMAL HIGH (ref 0.61–1.24)
GFR, Estimated: 59 mL/min — ABNORMAL LOW (ref >60)
Glucose, Bld: 202 mg/dL — ABNORMAL HIGH (ref 70–99)
Potassium: 4.3 mmol/L (ref 3.5–5.1)
Sodium: 136 mmol/L (ref 135–145)

## 2023-10-24 LAB — TROPONIN I (HIGH SENSITIVITY): Troponin I (High Sensitivity): 52 ng/L — ABNORMAL HIGH (ref <18)

## 2023-10-24 NOTE — ED Triage Notes (Addendum)
 PT here via [facility] from a church barbeque.  Family states that pt ate and went to the restroom. When he came out he was holding his throat saying he felt as if he was choking and couldn't breath.  Sensation was intense for 15 minutes, now only mild.    EKG nsr.   Bp 143/70 Sats 92% RA Hr 90 Cbg 233  Per EMS, family did not believe the symptoms were real - that he sometimes is confused and makes up symptoms, even though he can answer the [facility] questions.

## 2023-10-24 NOTE — ED Provider Notes (Signed)
 [place] EMERGENCY DEPARTMENT AT [facility] Provider Note   CSN: [id number] Arrival date & time: 10/24/23  2152     Patient presents with: choking sensation   [name] Williams is a 73 y.o. male. Patient has hx of DMT2, anxiety, CAD, HTN, CVA on DAPT with ASA + plavix.  {Add pertinent medical, surgical, social history, OB history to HPI:32947} HPI     Prior to Admission medications   Medication Sig Start Date End Date Taking? Authorizing Provider  allopurinol (ZYLOPRIM) 300 MG tablet Take 300 mg by mouth daily. 06/30/19   [provider]  amLODipine  (NORVASC ) 5 MG tablet Take 5 mg by mouth daily.  05/12/19   [provider]  aspirin  EC 81 MG tablet Take 81 mg by mouth daily. Swallow whole.    [provider]  atorvastatin  (LIPITOR ) 80 MG tablet Take 80 mg by mouth at bedtime.     [provider]  BD PEN NEEDLE NANO 2ND GEN 32G X 4 MM MISC  08/12/22   [provider]  cetirizine (ZYRTEC) 10 MG tablet Take 10 mg by mouth daily.     [provider]  citalopram  (CELEXA ) 20 MG tablet Take 20 mg by mouth daily.    [provider]  clopidogrel (PLAVIX) 75 MG tablet Take 75 mg by mouth daily. 08/23/22   [provider]  docusate sodium (COLACE) 100 MG capsule Take 100 mg by mouth daily.    [provider]  doxycycline (VIBRA-TABS) 100 MG tablet Take 100 mg by mouth 2 (two) times daily. Patient not taking: Reported on 06/12/2023 09/22/19   [provider]  FEROSUL 325 (65 Fe) MG tablet TAKE 1 TABLET EVERY OTHER DAY 03/23/20   Tobb, Kardie, DO  finasteride (PROSCAR) 5 MG tablet Take 5 mg by mouth daily. 06/26/22   [provider]  furosemide  (LASIX ) 20 MG tablet Take 20 mg by mouth daily. 06/11/22   [provider]  gabapentin  (NEURONTIN ) 300 MG capsule Take 2 pills (600 mg) in the morning and 3 pills (900 mg) at bedtime 10/13/22   Rush Nest, MD  gemfibrozil  (LOPID ) 600 MG tablet Take  600 mg by mouth 2 (two) times daily before a meal.    [provider]  glimepiride (AMARYL) 4 MG tablet Take 4 mg by mouth 2 (two) times daily.    [provider]  isosorbide  mononitrate (IMDUR ) 30 MG 24 hr tablet Take 30 mg by mouth daily.    [provider]  losartan  (COZAAR ) 100 MG tablet Take 100 mg by mouth at bedtime.  04/21/17   [provider]  meloxicam (MOBIC) 7.5 MG tablet Take 7.5 mg by mouth 2 (two) times daily. 04/22/21   [provider]  metFORMIN  (GLUCOPHAGE ) 1000 MG tablet Take 1 tablet (1,000 mg total) by mouth 2 (two) times daily with a meal. 10/16/19   End, Lonni, MD  metoprolol  succinate (TOPROL -XL) 25 MG 24 hr tablet Take 12.5 mg by mouth daily.    [provider]  montelukast  (SINGULAIR ) 10 MG tablet Take 10 mg by mouth daily.  04/15/17   [provider]  naphazoline-pheniramine (NAPHCON-A) 0.025-0.3 % ophthalmic solution Place 1 drop into both eyes 3 (three) times daily as needed for eye irritation (dry/irritated/allergy eyes.).     [provider]  nitroGLYCERIN  (NITROSTAT ) 0.4 MG SL tablet Place 0.4 mg under the tongue every 5 (five) minutes as needed for chest pain.    [provider]  NOVOLOG  FLEXPEN 100 UNIT/ML FlexPen Inject 20 Units into the skin in the morning, at noon, and at bedtime.  02/23/19   [provider]  Omega-3 Fatty Acids (OMEGA 3 PO) Take 2 capsules by mouth daily. Omega XL    [provider]  omeprazole (PRILOSEC) 40 MG capsule Take 40 mg by mouth at bedtime. 08/18/19   [provider]  pantoprazole  (PROTONIX ) 40 MG tablet Take 40 mg by mouth daily.    [provider]  potassium chloride  SA (KLOR-CON  M) 20 MEQ tablet Take 20 mEq by mouth daily.    [provider]  tamsulosin  (FLOMAX ) 0.4 MG CAPS capsule Take 0.4 mg by mouth daily.    [provider]  traMADol-acetaminophen  (ULTRACET) 37.5-325 MG tablet Take 1 tablet by  mouth as needed for moderate pain (pain score 4-6) or severe pain (pain score 7-10). 11/03/19   [provider]  TRESIBA FLEXTOUCH 100 UNIT/ML FlexTouch Pen Inject 60 Units into the skin daily. 04/22/19   [provider]  valACYclovir (VALTREX) 1000 MG tablet Take 2,000 mg by mouth 2 (two) times daily. 06/23/22   [provider]    Allergies: Pseudoephedrine, Penicillins, and Tetracyclines & related    Review of Systems  Updated Vital Signs BP 134/66 (BP Location: Right Arm)   Pulse 100   Temp 99.1 F (37.3 C) (Oral)   Resp 16   Ht 5' 11 (1.803 m)   Wt 128 kg   SpO2 90%   BMI 39.36 kg/m   Physical Exam  (all labs ordered are listed, but only abnormal results are displayed) Labs Reviewed  BASIC METABOLIC PANEL WITH GFR  CBC  TROPONIN I (HIGH SENSITIVITY)    EKG: None  Radiology: No results found.  {Document cardiac monitor, telemetry assessment procedure when appropriate:32947} Procedures   Medications Ordered in the ED - No data to display    {Click here for ABCD2, HEART and other calculators REFRESH Note before signing:1}                              Medical Decision Making Amount and/or Complexity of Data Reviewed Labs: ordered. Radiology: ordered.   ***  {Document critical care time when appropriate  Document review of labs and clinical decision tools ie CHADS2VASC2, etc  Document your independent review of radiology images and any outside records  Document your discussion with family members, caretakers and with consultants  Document social determinants of health affecting pt's care  Document your decision making why or why not admission, treatments were needed:32947:::1}   Final diagnoses:  None    ED Discharge Orders     None

## 2023-10-25 ENCOUNTER — Emergency Department (HOSPITAL_COMMUNITY)

## 2023-10-25 ENCOUNTER — Inpatient Hospital Stay (HOSPITAL_COMMUNITY)

## 2023-10-25 DIAGNOSIS — J810 Acute pulmonary edema: Secondary | ICD-10-CM | POA: Diagnosis not present

## 2023-10-25 DIAGNOSIS — J449 Chronic obstructive pulmonary disease, unspecified: Secondary | ICD-10-CM | POA: Diagnosis not present

## 2023-10-25 DIAGNOSIS — R7989 Other specified abnormal findings of blood chemistry: Secondary | ICD-10-CM | POA: Diagnosis not present

## 2023-10-25 DIAGNOSIS — I517 Cardiomegaly: Secondary | ICD-10-CM | POA: Diagnosis not present

## 2023-10-25 DIAGNOSIS — K219 Gastro-esophageal reflux disease without esophagitis: Secondary | ICD-10-CM | POA: Diagnosis not present

## 2023-10-25 DIAGNOSIS — N179 Acute kidney failure, unspecified: Secondary | ICD-10-CM | POA: Diagnosis not present

## 2023-10-25 DIAGNOSIS — M109 Gout, unspecified: Secondary | ICD-10-CM | POA: Diagnosis not present

## 2023-10-25 DIAGNOSIS — Z8673 Personal history of transient ischemic attack (TIA), and cerebral infarction without residual deficits: Secondary | ICD-10-CM | POA: Diagnosis not present

## 2023-10-25 DIAGNOSIS — D539 Nutritional anemia, unspecified: Secondary | ICD-10-CM | POA: Diagnosis not present

## 2023-10-25 DIAGNOSIS — I219 Acute myocardial infarction, unspecified: Secondary | ICD-10-CM | POA: Diagnosis not present

## 2023-10-25 DIAGNOSIS — R918 Other nonspecific abnormal finding of lung field: Secondary | ICD-10-CM | POA: Diagnosis not present

## 2023-10-25 DIAGNOSIS — K635 Polyp of colon: Secondary | ICD-10-CM | POA: Diagnosis not present

## 2023-10-25 DIAGNOSIS — R079 Chest pain, unspecified: Secondary | ICD-10-CM | POA: Diagnosis not present

## 2023-10-25 DIAGNOSIS — G4700 Insomnia, unspecified: Secondary | ICD-10-CM | POA: Diagnosis not present

## 2023-10-25 DIAGNOSIS — E1142 Type 2 diabetes mellitus with diabetic polyneuropathy: Secondary | ICD-10-CM | POA: Diagnosis not present

## 2023-10-25 DIAGNOSIS — D62 Acute posthemorrhagic anemia: Secondary | ICD-10-CM | POA: Diagnosis not present

## 2023-10-25 DIAGNOSIS — E1165 Type 2 diabetes mellitus with hyperglycemia: Secondary | ICD-10-CM | POA: Diagnosis not present

## 2023-10-25 DIAGNOSIS — F32A Depression, unspecified: Secondary | ICD-10-CM | POA: Diagnosis not present

## 2023-10-25 DIAGNOSIS — R195 Other fecal abnormalities: Secondary | ICD-10-CM | POA: Diagnosis not present

## 2023-10-25 DIAGNOSIS — E876 Hypokalemia: Secondary | ICD-10-CM | POA: Diagnosis not present

## 2023-10-25 DIAGNOSIS — J929 Pleural plaque without asbestos: Secondary | ICD-10-CM | POA: Diagnosis not present

## 2023-10-25 DIAGNOSIS — K648 Other hemorrhoids: Secondary | ICD-10-CM | POA: Diagnosis not present

## 2023-10-25 DIAGNOSIS — I5031 Acute diastolic (congestive) heart failure: Secondary | ICD-10-CM | POA: Diagnosis not present

## 2023-10-25 DIAGNOSIS — I2510 Atherosclerotic heart disease of native coronary artery without angina pectoris: Secondary | ICD-10-CM | POA: Diagnosis not present

## 2023-10-25 DIAGNOSIS — J90 Pleural effusion, not elsewhere classified: Secondary | ICD-10-CM | POA: Diagnosis not present

## 2023-10-25 DIAGNOSIS — R0603 Acute respiratory distress: Secondary | ICD-10-CM | POA: Diagnosis not present

## 2023-10-25 DIAGNOSIS — J9601 Acute respiratory failure with hypoxia: Secondary | ICD-10-CM | POA: Diagnosis present

## 2023-10-25 DIAGNOSIS — E669 Obesity, unspecified: Secondary | ICD-10-CM | POA: Insufficient documentation

## 2023-10-25 DIAGNOSIS — J811 Chronic pulmonary edema: Secondary | ICD-10-CM | POA: Diagnosis not present

## 2023-10-25 DIAGNOSIS — R0989 Other specified symptoms and signs involving the circulatory and respiratory systems: Secondary | ICD-10-CM | POA: Diagnosis not present

## 2023-10-25 DIAGNOSIS — I10 Essential (primary) hypertension: Secondary | ICD-10-CM | POA: Diagnosis not present

## 2023-10-25 DIAGNOSIS — M40204 Unspecified kyphosis, thoracic region: Secondary | ICD-10-CM | POA: Diagnosis not present

## 2023-10-25 DIAGNOSIS — K31819 Angiodysplasia of stomach and duodenum without bleeding: Secondary | ICD-10-CM | POA: Diagnosis not present

## 2023-10-25 DIAGNOSIS — K264 Chronic or unspecified duodenal ulcer with hemorrhage: Secondary | ICD-10-CM | POA: Diagnosis not present

## 2023-10-25 DIAGNOSIS — R0902 Hypoxemia: Secondary | ICD-10-CM | POA: Diagnosis not present

## 2023-10-25 DIAGNOSIS — E119 Type 2 diabetes mellitus without complications: Secondary | ICD-10-CM | POA: Diagnosis not present

## 2023-10-25 DIAGNOSIS — Z794 Long term (current) use of insulin: Secondary | ICD-10-CM | POA: Diagnosis not present

## 2023-10-25 DIAGNOSIS — K31811 Angiodysplasia of stomach and duodenum with bleeding: Secondary | ICD-10-CM | POA: Diagnosis not present

## 2023-10-25 DIAGNOSIS — E785 Hyperlipidemia, unspecified: Secondary | ICD-10-CM | POA: Diagnosis not present

## 2023-10-25 DIAGNOSIS — K5520 Angiodysplasia of colon without hemorrhage: Secondary | ICD-10-CM | POA: Diagnosis not present

## 2023-10-25 DIAGNOSIS — F05 Delirium due to known physiological condition: Secondary | ICD-10-CM | POA: Diagnosis not present

## 2023-10-25 DIAGNOSIS — N281 Cyst of kidney, acquired: Secondary | ICD-10-CM | POA: Diagnosis not present

## 2023-10-25 DIAGNOSIS — I5033 Acute on chronic diastolic (congestive) heart failure: Secondary | ICD-10-CM | POA: Diagnosis not present

## 2023-10-25 DIAGNOSIS — I4891 Unspecified atrial fibrillation: Secondary | ICD-10-CM | POA: Diagnosis not present

## 2023-10-25 DIAGNOSIS — I110 Hypertensive heart disease with heart failure: Secondary | ICD-10-CM | POA: Diagnosis not present

## 2023-10-25 DIAGNOSIS — K5521 Angiodysplasia of colon with hemorrhage: Secondary | ICD-10-CM | POA: Diagnosis not present

## 2023-10-25 DIAGNOSIS — D123 Benign neoplasm of transverse colon: Secondary | ICD-10-CM | POA: Diagnosis not present

## 2023-10-25 DIAGNOSIS — Z860101 Personal history of adenomatous and serrated colon polyps: Secondary | ICD-10-CM | POA: Diagnosis not present

## 2023-10-25 DIAGNOSIS — D509 Iron deficiency anemia, unspecified: Secondary | ICD-10-CM | POA: Diagnosis not present

## 2023-10-25 DIAGNOSIS — I2489 Other forms of acute ischemic heart disease: Secondary | ICD-10-CM | POA: Insufficient documentation

## 2023-10-25 DIAGNOSIS — I700 Atherosclerosis of aorta: Secondary | ICD-10-CM | POA: Diagnosis not present

## 2023-10-25 DIAGNOSIS — D649 Anemia, unspecified: Secondary | ICD-10-CM | POA: Diagnosis not present

## 2023-10-25 DIAGNOSIS — K922 Gastrointestinal hemorrhage, unspecified: Secondary | ICD-10-CM | POA: Diagnosis present

## 2023-10-25 DIAGNOSIS — D500 Iron deficiency anemia secondary to blood loss (chronic): Secondary | ICD-10-CM | POA: Diagnosis not present

## 2023-10-25 DIAGNOSIS — R4182 Altered mental status, unspecified: Secondary | ICD-10-CM | POA: Diagnosis not present

## 2023-10-25 LAB — I-STAT VENOUS BLOOD GAS, ED
Acid-Base Excess: 0 mmol/L (ref 0.0–2.0)
Bicarbonate: 25.5 mmol/L (ref 20.0–28.0)
Calcium, Ion: 1.19 mmol/L (ref 1.15–1.40)
HCT: 24 % — ABNORMAL LOW (ref 39.0–52.0)
Hemoglobin: 8.2 g/dL — ABNORMAL LOW (ref 13.0–17.0)
O2 Saturation: 66 %
Potassium: 4.2 mmol/L (ref 3.5–5.1)
Sodium: 141 mmol/L (ref 135–145)
TCO2: 27 mmol/L (ref 22–32)
pCO2, Ven: 43.4 mmHg — ABNORMAL LOW (ref 44–60)
pH, Ven: 7.376 (ref 7.25–7.43)
pO2, Ven: 35 mmHg (ref 32–45)

## 2023-10-25 LAB — TROPONIN I (HIGH SENSITIVITY)
Troponin I (High Sensitivity): 54 ng/L — ABNORMAL HIGH (ref <18)
Troponin I (High Sensitivity): 154 ng/L (ref <18)
Troponin I (High Sensitivity): 332 ng/L (ref <18)
Troponin I (High Sensitivity): 831 ng/L (ref <18)
Troponin I (High Sensitivity): 1037 ng/L (ref <18)

## 2023-10-25 LAB — CBG MONITORING, ED
Glucose-Capillary: 163 mg/dL — ABNORMAL HIGH (ref 70–99)
Glucose-Capillary: 112 mg/dL — ABNORMAL HIGH (ref 70–99)

## 2023-10-25 LAB — RENAL FUNCTION PANEL
Albumin: 3.9 g/dL (ref 3.5–5.0)
Anion gap: 16 — ABNORMAL HIGH (ref 5–15)
BUN: 14 mg/dL (ref 8–23)
CO2: 22 mmol/L (ref 22–32)
Calcium: 9.4 mg/dL (ref 8.9–10.3)
Chloride: 99 mmol/L (ref 98–111)
Creatinine, Ser: 1.07 mg/dL (ref 0.61–1.24)
GFR, Estimated: >60 mL/min (ref >60)
Glucose, Bld: 120 mg/dL — ABNORMAL HIGH (ref 70–99)
Phosphorus: 3.5 mg/dL (ref 2.5–4.6)
Potassium: 3.6 mmol/L (ref 3.5–5.1)
Sodium: 137 mmol/L (ref 135–145)

## 2023-10-25 LAB — I-STAT ARTERIAL BLOOD GAS, ED
Acid-base deficit: 1 mmol/L (ref 0.0–2.0)
Bicarbonate: 24.2 mmol/L (ref 20.0–28.0)
Calcium, Ion: 1.22 mmol/L (ref 1.15–1.40)
HCT: 26 % — ABNORMAL LOW (ref 39.0–52.0)
Hemoglobin: 8.8 g/dL — ABNORMAL LOW (ref 13.0–17.0)
O2 Saturation: 90 %
Patient temperature: 97.9
Potassium: 4 mmol/L (ref 3.5–5.1)
Sodium: 139 mmol/L (ref 135–145)
TCO2: 25 mmol/L (ref 22–32)
pCO2 arterial: 39.4 mmHg (ref 32–48)
pH, Arterial: 7.395 (ref 7.35–7.45)
pO2, Arterial: 58 mmHg — ABNORMAL LOW (ref 83–108)

## 2023-10-25 LAB — GLUCOSE, CAPILLARY
Glucose-Capillary: 112 mg/dL — ABNORMAL HIGH (ref 70–99)
Glucose-Capillary: 135 mg/dL — ABNORMAL HIGH (ref 70–99)
Glucose-Capillary: 184 mg/dL — ABNORMAL HIGH (ref 70–99)

## 2023-10-25 LAB — BRAIN NATRIURETIC PEPTIDE: B Natriuretic Peptide: 390.7 pg/mL — ABNORMAL HIGH (ref 0.0–100.0)

## 2023-10-25 LAB — IRON AND TIBC
Iron: 218 ug/dL — ABNORMAL HIGH (ref 45–182)
Saturation Ratios: 34 % (ref 17.9–39.5)
TIBC: 643 ug/dL — ABNORMAL HIGH (ref 250–450)
UIBC: 425 ug/dL

## 2023-10-25 LAB — HEMOGLOBIN A1C
Hgb A1c MFr Bld: 6.5 % — ABNORMAL HIGH (ref 4.8–5.6)
Mean Plasma Glucose: 139.85 mg/dL

## 2023-10-25 LAB — POC OCCULT BLOOD, ED: Fecal Occult Bld: POSITIVE — AB

## 2023-10-25 LAB — PREPARE RBC (CROSSMATCH)

## 2023-10-25 LAB — FERRITIN: Ferritin: 8 ng/mL — ABNORMAL LOW (ref 24–336)

## 2023-10-25 LAB — MAGNESIUM: Magnesium: 1.7 mg/dL (ref 1.7–2.4)

## 2023-10-25 MED ORDER — ONDANSETRON HCL 4 MG PO TABS: 4.0000 mg | ORAL_TABLET | Freq: Four times a day (QID) | ORAL | Status: DC | PRN

## 2023-10-25 MED ORDER — ATROPINE SULFATE 1 MG/10ML IJ SOSY: 0.5000 mg | PREFILLED_SYRINGE | Freq: Once | INTRAMUSCULAR | Status: DC

## 2023-10-25 MED ORDER — PANTOPRAZOLE SODIUM 40 MG IV SOLR
40.0000 mg | Freq: Two times a day (BID) | INTRAVENOUS | Status: DC
  Administered 2023-10-25 – 2023-10-29 (×8): 40 mg via INTRAVENOUS
  Filled 2023-10-25 (×8): qty 10

## 2023-10-25 MED ORDER — FUROSEMIDE 10 MG/ML IJ SOLN
40.0000 mg | Freq: Once | INTRAMUSCULAR | Status: AC
  Administered 2023-10-25: 40 mg via INTRAVENOUS
  Filled 2023-10-25: qty 4

## 2023-10-25 MED ORDER — HYDROCODONE-ACETAMINOPHEN 5-325 MG PO TABS: 1.0000 | ORAL_TABLET | ORAL | Status: DC | PRN

## 2023-10-25 MED ORDER — SODIUM CHLORIDE 0.9 % IV SOLN: 250.0000 mL | INTRAVENOUS | Status: AC | PRN

## 2023-10-25 MED ORDER — AMLODIPINE BESYLATE 10 MG PO TABS
10.0000 mg | ORAL_TABLET | Freq: Every day | ORAL | Status: DC
Start: 2023-10-25 — End: 2023-10-26
  Administered 2023-10-25: 10 mg via ORAL
  Filled 2023-10-25: qty 2

## 2023-10-25 MED ORDER — FUROSEMIDE 10 MG/ML IJ SOLN
40.0000 mg | Freq: Two times a day (BID) | INTRAMUSCULAR | Status: DC
  Administered 2023-10-25: 40 mg via INTRAVENOUS
  Filled 2023-10-25: qty 4

## 2023-10-25 MED ORDER — TAMSULOSIN HCL 0.4 MG PO CAPS
0.4000 mg | ORAL_CAPSULE | Freq: Every day | ORAL | Status: DC
  Administered 2023-10-25 – 2023-10-29 (×5): 0.4 mg via ORAL
  Filled 2023-10-25 (×5): qty 1

## 2023-10-25 MED ORDER — LORAZEPAM 2 MG/ML IJ SOLN
1.0000 mg | Freq: Once | INTRAMUSCULAR | Status: AC
  Administered 2023-10-25: 1 mg via INTRAVENOUS
  Filled 2023-10-25: qty 1

## 2023-10-25 MED ORDER — SODIUM CHLORIDE 0.9% IV SOLUTION: Freq: Once | INTRAVENOUS | Status: AC

## 2023-10-25 MED ORDER — IPRATROPIUM-ALBUTEROL 0.5-2.5 (3) MG/3ML IN SOLN
3.0000 mL | Freq: Once | RESPIRATORY_TRACT | Status: AC
  Administered 2023-10-25: 3 mL via RESPIRATORY_TRACT
  Filled 2023-10-25: qty 3

## 2023-10-25 MED ORDER — FUROSEMIDE 10 MG/ML IJ SOLN
40.0000 mg | Freq: Every day | INTRAMUSCULAR | Status: DC
Start: 2023-10-26 — End: 2023-10-25

## 2023-10-25 MED ORDER — MELATONIN 3 MG PO TABS
3.0000 mg | ORAL_TABLET | Freq: Every evening | ORAL | Status: DC | PRN
  Administered 2023-10-26: 3 mg via ORAL
  Filled 2023-10-25: qty 1

## 2023-10-25 MED ORDER — SODIUM CHLORIDE 0.9% FLUSH: 3.0000 mL | INTRAVENOUS | Status: DC | PRN

## 2023-10-25 MED ORDER — ISOSORBIDE MONONITRATE ER 30 MG PO TB24
30.0000 mg | ORAL_TABLET | Freq: Every day | ORAL | Status: DC
  Administered 2023-10-25 – 2023-10-29 (×5): 30 mg via ORAL
  Filled 2023-10-25 (×5): qty 1

## 2023-10-25 MED ORDER — HYDROCERIN EX CREA
TOPICAL_CREAM | Freq: Two times a day (BID) | CUTANEOUS | Status: DC
  Administered 2023-10-27: 1 via TOPICAL
  Filled 2023-10-25 (×3): qty 113

## 2023-10-25 MED ORDER — FERROUS SULFATE 325 (65 FE) MG PO TABS
325.0000 mg | ORAL_TABLET | Freq: Every day | ORAL | Status: DC
Start: 2023-10-25 — End: 2023-10-26
  Administered 2023-10-25: 325 mg via ORAL
  Filled 2023-10-25 (×2): qty 1

## 2023-10-25 MED ORDER — LOSARTAN POTASSIUM 50 MG PO TABS
100.0000 mg | ORAL_TABLET | Freq: Every day | ORAL | Status: DC
  Administered 2023-10-25 – 2023-10-28 (×4): 100 mg via ORAL
  Filled 2023-10-25 (×4): qty 2

## 2023-10-25 MED ORDER — ONDANSETRON HCL 4 MG/2ML IJ SOLN: 4.0000 mg | Freq: Four times a day (QID) | INTRAMUSCULAR | Status: DC | PRN

## 2023-10-25 MED ORDER — INSULIN ASPART 100 UNIT/ML IJ SOLN
0.0000 [IU] | INTRAMUSCULAR | Status: DC
  Administered 2023-10-25 (×2): 2 [IU] via SUBCUTANEOUS
  Administered 2023-10-25 – 2023-10-26 (×2): 1 [IU] via SUBCUTANEOUS
  Administered 2023-10-26: 2 [IU] via SUBCUTANEOUS
  Administered 2023-10-26: 3 [IU] via SUBCUTANEOUS

## 2023-10-25 MED ORDER — LORATADINE 10 MG PO TABS
10.0000 mg | ORAL_TABLET | Freq: Every day | ORAL | Status: DC
  Administered 2023-10-25 – 2023-10-29 (×5): 10 mg via ORAL
  Filled 2023-10-25 (×5): qty 1

## 2023-10-25 MED ORDER — CITALOPRAM HYDROBROMIDE 20 MG PO TABS
20.0000 mg | ORAL_TABLET | Freq: Every day | ORAL | Status: DC
  Administered 2023-10-25 – 2023-10-29 (×5): 20 mg via ORAL
  Filled 2023-10-25: qty 1
  Filled 2023-10-25: qty 2
  Filled 2023-10-25 (×3): qty 1

## 2023-10-25 MED ORDER — ACETAMINOPHEN 650 MG RE SUPP: 650.0000 mg | Freq: Four times a day (QID) | RECTAL | Status: DC | PRN

## 2023-10-25 MED ORDER — IOHEXOL 350 MG/ML SOLN
75.0000 mL | Freq: Once | INTRAVENOUS | Status: AC | PRN
  Administered 2023-10-25: 75 mL via INTRAVENOUS

## 2023-10-25 MED ORDER — SODIUM CHLORIDE 0.9% FLUSH
3.0000 mL | Freq: Two times a day (BID) | INTRAVENOUS | Status: DC
  Administered 2023-10-25 – 2023-10-29 (×8): 3 mL via INTRAVENOUS

## 2023-10-25 MED ORDER — ACETAMINOPHEN 325 MG PO TABS: 650.0000 mg | ORAL_TABLET | Freq: Four times a day (QID) | ORAL | Status: DC | PRN

## 2023-10-25 MED ORDER — PERFLUTREN LIPID MICROSPHERE
1.0000 mL | INTRAVENOUS | Status: AC | PRN
  Administered 2023-10-25: 2 mL via INTRAVENOUS

## 2023-10-25 MED ORDER — FUROSEMIDE 10 MG/ML IJ SOLN
80.0000 mg | Freq: Two times a day (BID) | INTRAMUSCULAR | Status: DC
Start: 2023-10-25 — End: 2023-10-27
  Administered 2023-10-25 – 2023-10-26 (×3): 80 mg via INTRAVENOUS
  Filled 2023-10-25 (×4): qty 8

## 2023-10-25 MED ORDER — ATORVASTATIN CALCIUM 80 MG PO TABS
80.0000 mg | ORAL_TABLET | Freq: Every day | ORAL | Status: DC
  Administered 2023-10-25 – 2023-10-28 (×4): 80 mg via ORAL
  Filled 2023-10-25 (×4): qty 1

## 2023-10-25 NOTE — Progress Notes (Signed)
 Patient found having gotten out of bed and removed his IV. He is only oriented to self and place. He has requested something to help him sleep. The on-call physician has been notified.

## 2023-10-25 NOTE — ED Notes (Signed)
 Pt found with oxygen  off and playing with pulse ox, pt educated and reminded on importance of wearing oxygen  and leaving monitors on. Pt expresses understanding

## 2023-10-25 NOTE — ED Notes (Signed)
 CCMD called to place patient on monitor in room 3.

## 2023-10-25 NOTE — ED Notes (Signed)
 Blood restarted again per MD.

## 2023-10-25 NOTE — ED Notes (Signed)
 CCMD called and pt on monitor

## 2023-10-25 NOTE — ED Notes (Signed)
 Arrived in patients room and pt was at edge of bed with more labored breathing, PA came to bedside and pt was desating to 82% on 6L [place], pt was put on non-rebreather and increased. RT came to bedside and put pt on high flow in which pt tolerated well. Although when trying to assess pt, pt was found more confused and when asked questions such as are you having pain pt responds do you like deer meat. New set of vitals taken and blood started, medications given per [facility]. After 15 minutes, pt was asked orientation questions and answered them all correct but still was making off hand statements that does not make sense with conversation being had. Pt denies any reaction symptoms

## 2023-10-25 NOTE — Progress Notes (Signed)
  Echocardiogram 2D Echocardiogram has been performed.  Devora Ellouise SAUNDERS 10/25/2023, 3:57 PM

## 2023-10-25 NOTE — H&P (Signed)
 History and Physical    [name] Williams FMW:[id number] DOB: 1950-05-29 DOA: 10/24/2023  PCP: Keren Vicenta BRAVO, MD  Patient coming from: Home, lives alone  Chief Complaint: SOB  HPI: [name] Williams is a 73 y.o. male with medical history significant of history of stroke on baby aspirin , Plavix, Lipitor , hypertension, BPH, chronic diastolic CHF, diabetes, CAD who presents with shortness of breath.  He is alert and oriented but is a very poor historian.  He states that he was at a church function at a barbecue yesterday, started to get shortness of breath.  Admits to a dry cough.  No nausea or vomiting.  He also admits to bright red blood in his stools that has been intermittent and ongoing for a while but is unable to state how long it has been going on for.  States that he had a colonoscopy with Dr. Charlanne previously.  Denies any over-the-counter ibuprofen use.  He last took his medications Saturday morning, his granddaughter helps with his medications.  He is widowed and lives alone.  Has 2 sons and multiple siblings who live in [place].  ED Course: Patient desatted down to 84%, eventually needed 10 L high flow nasal cannula O2.  PCCM also consulted for evaluation.  Patient found to have hemoglobin 7 with positive FOBT.  He was given 1 unit packed red blood cell transfusion.  Review of Systems: As per HPI. Otherwise, all other review of systems reviewed and are negative.   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) 10/13/2019   DDD (degenerative disc disease), cervical    Depression    Dyslipidemia 05/29/2017   Essential hypertension 05/29/2017   Generalized osteoarthritis  of multiple sites    Gout    Hyperlipidemia    Hypertension, benign    Laryngopharyngeal reflux    Morbid obesity (HCC) 11/26/2018   Shortness of breath 07/17/2017   Sinus arrhythmia 11/26/2018   Stroke (HCC) 08/2022   [facility] hospitalized   Swelling 05/29/2017   Tobacco abuse    Type 2 diabetes mellitus without complication, without long-term current use of insulin  (HCC) 05/29/2017   Uncontrolled type 2 diabetes mellitus with peripheral neuropathy     Past Surgical History:  Procedure Laterality Date   HERNIA REPAIR  1998   Abdominal   LEFT HEART CATH AND CORONARY ANGIOGRAPHY N/A 10/13/2019   Procedure: LEFT HEART CATH AND CORONARY ANGIOGRAPHY;  Surgeon: Mady Bruckner, MD;  Location: [facility];  Service: Cardiovascular;  Laterality: N/A;   MINOR HEMORRHOIDECTOMY  1998   NASAL SINUS SURGERY  1978   SHOULDER SURGERY Left    X2   TONSILLECTOMY  1967     reports that he has quit smoking. He has quit using smokeless tobacco.  His smokeless tobacco use included chew. He reports that he does not currently use alcohol. He reports that he does not use drugs.  Allergies  Allergen Reactions   Pseudoephedrine Other (See Comments)   Penicillins Rash   Tetracyclines & Related Rash    Family History  Problem Relation Age of Onset   Heart disease Mother    Heart attack Mother    Stroke  Father    Lung cancer Father    Alcohol abuse Father    Diabetes Brother    Stomach cancer Maternal Grandmother    Stomach cancer Paternal Grandmother    Colon cancer Neg Hx    Esophageal cancer Neg Hx    Rectal cancer Neg Hx     Prior to Admission medications   Medication Sig Start Date End Date Taking? Authorizing Provider  allopurinol (ZYLOPRIM) 300 MG tablet Take 300 mg by mouth daily. 06/30/19   [provider]  amLODipine  (NORVASC ) 5 MG tablet Take 5 mg by mouth daily.  05/12/19   [provider]  aspirin  EC 81 MG tablet Take 81 mg by mouth daily. Swallow whole.     [provider]  atorvastatin  (LIPITOR ) 80 MG tablet Take 80 mg by mouth at bedtime.     [provider]  BD PEN NEEDLE NANO 2ND GEN 32G X 4 MM MISC  08/12/22   [provider]  cetirizine (ZYRTEC) 10 MG tablet Take 10 mg by mouth daily.     [provider]  citalopram  (CELEXA ) 20 MG tablet Take 20 mg by mouth daily.    [provider]  clopidogrel (PLAVIX) 75 MG tablet Take 75 mg by mouth daily. 08/23/22   [provider]  docusate sodium (COLACE) 100 MG capsule Take 100 mg by mouth daily.    [provider]  doxycycline (VIBRA-TABS) 100 MG tablet Take 100 mg by mouth 2 (two) times daily. 09/22/19   [provider]  FEROSUL 325 (65 Fe) MG tablet TAKE 1 TABLET EVERY OTHER DAY 03/23/20   Tobb, Kardie, DO  finasteride (PROSCAR) 5 MG tablet Take 5 mg by mouth daily. 06/26/22   [provider]  furosemide  (LASIX ) 20 MG tablet Take 20 mg by mouth daily. 06/11/22   [provider]  gabapentin  (NEURONTIN ) 300 MG capsule Take 2 pills (600 mg) in the morning and 3 pills (900 mg) at bedtime 10/13/22   Rush Nest, MD  gemfibrozil  (LOPID ) 600 MG tablet Take 600 mg by mouth 2 (two) times daily before a meal.    [provider]  glimepiride (AMARYL) 4 MG tablet Take 4 mg by mouth 2 (two) times daily.    [provider]  isosorbide  mononitrate (IMDUR ) 30 MG 24 hr tablet Take 30 mg by mouth daily.    [provider]  losartan  (COZAAR ) 100 MG tablet Take 100 mg by mouth at bedtime.  04/21/17   [provider]  meloxicam (MOBIC) 7.5 MG tablet Take 7.5 mg by mouth 2 (two) times daily. 04/22/21   [provider]  metFORMIN  (GLUCOPHAGE ) 1000 MG tablet Take 1 tablet (1,000 mg total) by mouth 2 (two) times daily with a meal. 10/16/19   End, Lonni, MD  metoprolol  succinate (TOPROL -XL) 25 MG 24 hr tablet Take 12.5 mg by mouth daily.    [provider]  montelukast  (SINGULAIR ) 10 MG  tablet Take 10 mg by mouth daily.  04/15/17   [provider]  naphazoline-pheniramine (NAPHCON-A) 0.025-0.3 % ophthalmic solution Place 1 drop into both eyes 3 (three) times daily as needed for eye irritation (dry/irritated/allergy eyes.).     [provider]  nitroGLYCERIN  (NITROSTAT ) 0.4 MG SL tablet Place 0.4 mg under the tongue every 5 (five) minutes as needed for chest pain.    [provider]  NOVOLOG  FLEXPEN 100 UNIT/ML FlexPen Inject 20 Units into the skin in the morning, at noon, and at bedtime.  02/23/19   [provider]  Omega-3 Fatty Acids (OMEGA 3 PO) Take 2 capsules by mouth daily. Omega XL    [provider]  omeprazole (PRILOSEC) 40 MG capsule Take 40 mg by mouth at bedtime. 08/18/19   [provider]  pantoprazole  (PROTONIX ) 40 MG tablet Take 40 mg by mouth daily.    [provider]  potassium chloride  SA (KLOR-CON  M) 20 MEQ tablet Take 20 mEq by mouth daily.    [provider]  tamsulosin  (FLOMAX ) 0.4 MG CAPS capsule Take 0.4 mg by mouth daily.    [provider]  traMADol-acetaminophen  (ULTRACET) 37.5-325 MG tablet Take 1 tablet by mouth as needed for moderate pain (pain score 4-6) or severe pain (pain score 7-10). 11/03/19   [provider]  TRESIBA FLEXTOUCH 100 UNIT/ML FlexTouch Pen Inject 60 Units into the skin daily. 04/22/19   [provider]  valACYclovir (VALTREX) 1000 MG tablet Take 2,000 mg by mouth 2 (two) times daily. 06/23/22   [provider]    Physical Exam: Vitals:   10/25/23 0656 10/25/23 0715 10/25/23 0759 10/25/23 0800  BP:   (!) 142/110 (!) 142/110  Pulse: 93  89   Resp: 19  (!) 21 19  Temp:   97.8 F (36.6 C)   TempSrc:   Oral   SpO2: 91% 100% 99%   Weight:      Height:         Constitutional: NAD, calm, comfortable Eyes: PERRL, lids and conjunctivae normal Respiratory: Bibasilar crackles.  No respiratory distress, no conversational  dyspnea Cardiovascular: Regular rate and rhythm, no murmurs. +1 bilateral extremity edema.  Abdomen: Soft, nondistended, nontender to palpation. Bowel sounds positive.  Musculoskeletal: No joint deformity upper and lower extremities. No contractures. Normal muscle tone.  Skin: no rashes, lesions, ulcers on exposed skin  Neurologic: Alert and oriented, speech fluent, no focal deficit, poor historian  Labs on Admission: I have personally reviewed following labs and imaging studies  CBC: Recent Labs  Lab 10/24/23 2213 10/25/23 0431 10/25/23 0539  WBC 10.9*  --   --   HGB 7.0* 8.2* 8.8*  HCT 24.9* 24.0* 26.0*  MCV 87.1  --   --   PLT 453*  --   --    Basic Metabolic Panel: Recent Labs  Lab 10/24/23 2213 10/25/23 0431 10/25/23 0539  NA 136 141 139  K 4.3 4.2 4.0  CL 104  --   --   CO2 21*  --   --   GLUCOSE 202*  --   --   BUN 17  --   --   CREATININE 1.27*  --   --   CALCIUM  9.0  --   --    GFR: Estimated Creatinine Clearance: 69.6 mL/min (A) (by C-G formula based on SCr of 1.27 mg/dL (H)). Liver Function Tests: No results for input(s): AST, ALT, ALKPHOS, BILITOT, PROT, ALBUMIN in the last 168 hours. No results for input(s): LIPASE, AMYLASE in the last 168 hours. No results for input(s): AMMONIA in the last 168 hours. Coagulation Profile: No results for input(s): INR, PROTIME in the last 168 hours. Cardiac Enzymes: No results for input(s): CKTOTAL, CKMB, CKMBINDEX, TROPONINI in the last 168 hours. BNP (last 3 results) No results for input(s): PROBNP in the last 8760 hours. HbA1C: No results for input(s): HGBA1C in the last 72 hours. CBG: No results for input(s): GLUCAP in the last 168 hours. Lipid Profile: No results for input(s): CHOL, HDL, LDLCALC, TRIG,  CHOLHDL, LDLDIRECT in the last 72 hours. Thyroid  Function Tests: No results for input(s): TSH, T4TOTAL, FREET4, T3FREE, THYROIDAB in the last 72  hours. Anemia Panel: No results for input(s): VITAMINB12, FOLATE, FERRITIN, TIBC, IRON , RETICCTPCT in the last 72 hours. Urine analysis:    Component Value Date/Time   COLORURINE YELLOW 05/18/2019 1533   APPEARANCEUR CLEAR 05/18/2019 1533   LABSPEC 1.015 05/18/2019 1533   PHURINE 5.0 05/18/2019 1533   GLUCOSEU NEGATIVE 05/18/2019 1533   HGBUR NEGATIVE 05/18/2019 1533   BILIRUBINUR NEGATIVE 05/18/2019 1533   KETONESUR NEGATIVE 05/18/2019 1533   PROTEINUR NEGATIVE 05/18/2019 1533   NITRITE NEGATIVE 05/18/2019 1533   LEUKOCYTESUR NEGATIVE 05/18/2019 1533   Sepsis Labs: !!!!!!!!!!!!!!!!!!!!!!!!!!!!!!!!!!!!!!!!!!!! @LABRCNTIP (procalcitonin:4,lacticidven:4) )No results found for this or any previous visit (from the past 240 hours).   Radiological Exams on Admission: DG Chest Portable 1 View Result Date: 10/25/2023 CLINICAL DATA:  73 year old male with choking sensation and shortness of breath. EXAM: PORTABLE CHEST 1 VIEW COMPARISON:  CTA chest 0340 hours today and earlier. FINDINGS: Portable AP semi upright view at 0514 hours. Lordotic positioning. Borderline to mild cardiomegaly, some mediastinal lipomatosis also demonstrated by CT. Stable cardiac size and mediastinal contours. Visualized tracheal air column is within normal limits. Small layering pleural effusions better demonstrated by CT. Stable bibasilar hypo ventilation. No pneumothorax or air bronchograms. Interstitial edema suspected by CTA, vascularity appears stable from radiographs yesterday. No acute osseous abnormality identified. IMPRESSION: Stable ventilation from earlier CTA and radiographs. Suspected interstitial edema, small layering pleural effusions better demonstrated by CTA, bibasilar hypoventilation. Electronically Signed   By: VEAR Hurst M.D.   On: 10/25/2023 05:39   CT Angio Chest PE W/Cm &/Or Wo Cm Result Date: 10/25/2023 EXAM: CTA of the Chest with contrast for PE 10/25/2023 03:47:31 AM TECHNIQUE: CTA of the  chest was performed after the administration of intravenous contrast. Multiplanar reformatted images are provided for review. MIP images are provided for review. Automated exposure control, iterative reconstruction, and/or weight based adjustment of the mA/kV was utilized to reduce the radiation dose to as low as reasonably achievable. COMPARISON: Chest radiograph dated 10/24/2023. CTA dated 09/28/2023. CLINICAL HISTORY: Pulmonary embolism (PE) suspected, high prob. FINDINGS: PULMONARY ARTERIES: Pulmonary arteries are adequately opacified for evaluation. No pulmonary embolism. Main pulmonary artery is normal in caliber. MEDIASTINUM: The heart and pericardium demonstrate no acute abnormality. Thoracic aortic atherosclerosis. There is no acute abnormality of the thoracic aorta. LYMPH NODES: 10 mm short axis subcarinal node (image 69), mildly progressive, likely reactive. LUNGS AND PLEURA: Mild interlobular septal thickening in the upper lobes, suggesting very mild interstitial edema. Suspected mild bilateral lower lobe atelectasis. Trace bilateral pleural effusions, right greater than left. UPPER ABDOMEN: 4.7 cm right renal cyst (image 153), benign (Bosniak 1), no follow up is recommended. SOFT TISSUES AND BONES: No acute bone or soft tissue abnormality. IMPRESSION: 1. No evidence of pulmonary embolism. 2. Suspected mild interstitial edema. Trace bilateral pleural effusions, right greater than left. Electronically signed by: Pinkie Pebbles MD 10/25/2023 03:52 AM EDT RP Workstation: [id number]   CT Head Wo Contrast Result Date: 10/25/2023 EXAM: CT HEAD WITHOUT CONTRAST 10/25/2023 03:12:14 AM TECHNIQUE: CT of the head was performed without the administration of intravenous contrast. Automated exposure control, iterative reconstruction, and/or weight based adjustment of the mA/kV was utilized to reduce the radiation dose to as low as reasonably achievable. COMPARISON: 04/02/2023 CLINICAL HISTORY: Mental status  change, unknown cause. FINDINGS: BRAIN AND VENTRICLES: No acute hemorrhage. No evidence of acute infarct. No hydrocephalus. No extra-axial  collection. No mass effect or midline shift. ORBITS: No acute abnormality. SINUSES: No acute abnormality. SOFT TISSUES AND SKULL: No acute soft tissue abnormality. No skull fracture. IMPRESSION: 1. No acute intracranial abnormality. Electronically signed by: Franky Stanford MD 10/25/2023 03:20 AM EDT RP Workstation: [id number]   DG Chest 2 View Result Date: 10/24/2023 CLINICAL DATA:  Choking sensation. EXAM: DG CHEST 2V COMPARISON:  Portable chest 04/01/2023, CTA chest 09/28/2023. FINDINGS: Mild-to-moderate cardiomegaly. No vascular congestion is seen. The sulci are sharp. The lungs are clear. Stable mediastinum. Vascular prominence right paratracheal stripe. Aortic atherosclerosis. First spondylosis. Mild osteopenia and thoracic kyphosis. Thoracic cage is intact. Compare: Stable chest. IMPRESSION: 1. No evidence of acute chest disease. 2. Cardiomegaly. 3. Aortic atherosclerosis. Electronically Signed   By: Francis Quam M.D.   On: 10/24/2023 22:44    EKG: Independently reviewed.  Normal sinus rhythm, borderline prolonged PR interval  Assessment/Plan Principal Problem:   Acute hypoxemic respiratory failure (HCC) Active Problems:   Essential hypertension   Hyperlipidemia   Gout   BPH (benign prostatic hyperplasia)   DM (diabetes mellitus) with complications (HCC)   Anemia   Dyslipidemia   Coronary artery disease involving native coronary artery of native heart without angina pectoris   Hypertension, benign   GI bleed   Demand ischemia (HCC)   History of CVA (cerebrovascular accident)   Obesity (BMI 30-39.9)   Acute hypoxemic respiratory failure - Desatted into the 80s and required up to 10 L high flow nasal cannula O2 --> on 8L at time of my evaluation  - PCCM consulted  Acute on chronic diastolic CHF - CT with interstitial edema, trace effusions -  Echocardiogram recently completed 04/02/2023 which showed EF 55 to 60%.  Will repeat in setting of elevated troponin - Patient given IV Lasix  in the ED, continue daily  - Strict I's and O's  GI bleed Acute blood loss anemia History of iron  deficiency anemia  - FOBT positive and presented with hemoglobin 7 - Patient given 1 unit prbc - Patient takes baby aspirin  and Plavix due to history of CVA.  Last took his meds 9/20 AM - Denied any over the counter NSAID use, but PTA med list notes patient on mobic BID  - Evaluated by Dr. Ezzard (Heme/Onc) in 06/15/23 for iron  deficiency anemia. Was referred to GI at that time but does not appear to have followed up. Last seen by Dr. Charlanne 09/25/22. Last colonoscopy/EGD 2021 - GI consulted today - NPO until eval by GI   Demand ischemia - Troponin 54, 154 - No chest pain at time of admission - Echo ordered   History of CVA - Hold aspirin , Plavix - Lipitor   Hypertension - Norvasc , Cozaar   CAD - Imdur   Diabetes mellitus - Hold metformin  - Check hemoglobin A1c - Sliding scale insulin  q4 hours while NPO   Obesity - Estimated body mass index is 39.36 kg/m as calculated from the following:   Height as of this encounter: 5' 11 (1.803 m).   Weight as of this encounter: 128 kg.    DVT prophylaxis: SCD Code Status: Full code, confirmed with patient at time of admission Family Communication: None at bedside Disposition Plan: Home Consults called: PCCM, GI  Severity of Illness: The appropriate patient status for this patient is INPATIENT. Inpatient status is judged to be reasonable and necessary in order to provide the required intensity of service to ensure the patient's safety. The patient's presenting symptoms, physical exam findings, and initial radiographic and laboratory data in  the context of their chronic comorbidities is felt to place them at high risk for further clinical deterioration. Furthermore, it is not anticipated that the  patient will be medically stable for discharge from the hospital within 2 midnights of admission.   * I certify that at the point of admission it is my clinical judgment that the patient will require inpatient hospital care spanning beyond 2 midnights from the point of admission due to high intensity of service, high risk for further deterioration and high frequency of surveillance required.[name] Delon Hoe, DO [facility] 10/25/2023, 8:43 AM   Available via Epic secure chat 7am-7pm After these hours, please refer to coverage provider listed on [email or web address]

## 2023-10-25 NOTE — ED Notes (Signed)
 Pt was trying to use urinal when his HR increased to 120s, pt was flushed and O2 sats decreased to 82% on 7L [place]. O2 was increased to 10L, pt reports this is a similar feeling to the choking sensation he has been having earlier. Pt was tripoding and PA came to bedside to evaluate pt, new EKG was taken and blood was paused. Transfusion is paused per MD, unsure if a transfusion reaction dt pt already increasing O2 needs prior to transfusion. Temp 97.9 F and denies any itching. Pt work of breathing has increased and pt appears uncomfortable in bed.

## 2023-10-25 NOTE — Consult Note (Signed)
 NAME:  [name] Williams, MRN:  [id number], DOB:  [birth year], LOS: 0 ADMISSION DATE:  10/24/2023, CONSULTATION DATE:  9/21 REFERRING MD:  Kathrin, CHIEF COMPLAINT:  resp distress   History of Present Illness:  73 year old male w/ Chronic diastolic heart failure, diabetes.  Apparently a poor historian.  Per medical review he was in his usual state of health up until 9/20 when he was at a church barbecue and started to notice he was more short of breath.  He had a dry nonproductive cough no nausea or vomiting denied chest pain has had possibly some bloody stool apparently this has been ongoing for some time. Presented to the emergency room because of shortness of breath in the ER found to have room air saturations of 84% requiring titration up to 10 L in order to sustain saturations above 90. Additional ER evaluation showed hemoglobin of 7 with positive fecal occult blood for which she was administered 1 unit of packed red blood cells , Troponin was slightly elevated at 54, BNP 390 arterial blood gas showed a pH of 7.39, pCO2 of 39 PaO2 of 58 bicarbonate of 24.  Portable chest x-ray showed fairly significant cardiomegaly with bilateral pulmonary edema, a CT of chest was obtained again consistent with pulmonary edema and negative for pulmonary emboli.  Additional therapeutic interventions have included 40 of Lasix , GI consultation, and echocardiogram. Critical care asked to evaluate because of pulmonary edema and respiratory distress  Pertinent  Medical History  HTN, FESO4 def anemia, osteoarthritis, prior stroke w/ left sided hemiplegia, type 2 DM, OSA, COPD, CAD, Afib, bladder cancer, GERD, dysphagia, HLD   Significant Hospital Events: Including procedures, antibiotic start and stop dates in addition to other pertinent events   9/21 admitted with acute shortness of breath  Interim History / Subjective:  Feels better. Now laying flat  Objective    Blood pressure (!) 154/73, pulse 89, temperature 97.8  F (36.6 C), temperature source Oral, resp. rate 19, height 5' 11 (1.803 m), weight 128 kg, SpO2 99%.    FiO2 (%):  [10 %] 10 %   Intake/Output Summary (Last 24 hours) at 10/25/2023 1052 Last data filed at 10/25/2023 0800 Gross per 24 hour  Intake 350 ml  Output 1790 ml  Net -1440 ml   Filed Weights   10/24/23 2157  Weight: 128 kg    Examination: General: 73 year old male patient lying in bed no acute distress currently flat in bed denies orthopnea currently on 10 L, I moved into 8 without increased work of breathing HENT: Normocephalic atraumatic no jugular venous distention Lungs: Bibasilar rales no accessory use currently 8 L at 100% Cardiovascular: Regular rate and rhythm Abdomen: Soft nontender no organomegaly Extremities: Warm dry brisk cap refill Neuro: Awake, alert, oriented, has terrible recall, really not able to give any history GU: Voids  Resolved problem list   Assessment and Plan   Acute hypoxic respiratory failure secondary to acute diastolic heart failure and pulmonary edema Has already received IV Lasix  Plan Continue supplemental oxygen  and wean for greater than 92% Continue continuous pulse oximetry Continue IV diuresis Echocardiogram He needs cardiology evaluation   Acute diastolic heart failure, complicated by demand cardiac ischemia Plan Telemetry monitoring Diuresis as above Continue with his Norvasc , losartan  and isosorbide  Echocardiogram Again he needs cardiology evaluation  Anemia with fecal occult blood Has history of anemia, had been referred to GI from hematology.  Not convinced that this is an acute process.  He has received 1 unit  of PRBCs for hemoglobin of 7 Plan Hold anticoagulation PPI GI evaluation pending  Type 2 diabetes Plan Sliding scale insulin   BPH Plan Flomax   All other issues per IM   We are signing off, continue to diurese and have cardiology see as suggested earlier this morning Labs   CBC: Recent Labs   Lab 10/24/23 2213 10/25/23 0431 10/25/23 0539  WBC 10.9*  --   --   HGB 7.0* 8.2* 8.8*  HCT 24.9* 24.0* 26.0*  MCV 87.1  --   --   PLT 453*  --   --     Basic Metabolic Panel: Recent Labs  Lab 10/24/23 2213 10/25/23 0431 10/25/23 0539  NA 136 141 139  K 4.3 4.2 4.0  CL 104  --   --   CO2 21*  --   --   GLUCOSE 202*  --   --   BUN 17  --   --   CREATININE 1.27*  --   --   CALCIUM  9.0  --   --    GFR: Estimated Creatinine Clearance: 69.6 mL/min (A) (by C-G formula based on SCr of 1.27 mg/dL (H)). Recent Labs  Lab 10/24/23 2213  WBC 10.9*    Liver Function Tests: No results for input(s): AST, ALT, ALKPHOS, BILITOT, PROT, ALBUMIN in the last 168 hours. No results for input(s): LIPASE, AMYLASE in the last 168 hours. No results for input(s): AMMONIA in the last 168 hours.  ABG    Component Value Date/Time   PHART 7.395 10/25/2023 0539   PCO2ART 39.4 10/25/2023 0539   PO2ART 58 (L) 10/25/2023 0539   HCO3 24.2 10/25/2023 0539   TCO2 25 10/25/2023 0539   ACIDBASEDEF 1.0 10/25/2023 0539   O2SAT 90 10/25/2023 0539     Coagulation Profile: No results for input(s): INR, PROTIME in the last 168 hours.  Cardiac Enzymes: No results for input(s): CKTOTAL, CKMB, CKMBINDEX, TROPONINI in the last 168 hours.  HbA1C: Hgb A1c MFr Bld  Date/Time Value Ref Range Status  10/25/2023 08:58 AM 6.5 (H) 4.8 - 5.6 % Final    Comment:    (NOTE) Diagnosis of Diabetes The following HbA1c ranges recommended by the American Diabetes Association (ADA) may be used as an aid in the diagnosis of diabetes mellitus.  Hemoglobin             Suggested A1C NGSP%              Diagnosis  <5.7                   Non Diabetic  5.7-6.4                Pre-Diabetic  >6.4                   Diabetic  <7.0                   Glycemic control for                       adults with diabetes.    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   Surgical History:   Past Surgical History:  Procedure Laterality Date   HERNIA REPAIR  1998   Abdominal   LEFT HEART CATH AND CORONARY ANGIOGRAPHY N/A 10/13/2019   Procedure: LEFT HEART CATH AND CORONARY ANGIOGRAPHY;  Surgeon: Mady Bruckner, MD;  Location: [facility];  Service: Cardiovascular;  Laterality: N/A;   MINOR HEMORRHOIDECTOMY  1998   NASAL SINUS SURGERY  1978   SHOULDER SURGERY Left    X2   TONSILLECTOMY  1967     Social History:   reports that he has quit smoking. He has quit using smokeless tobacco.  His smokeless tobacco use included chew. He reports that he does not currently use alcohol. He reports that he does not use drugs.   Family History:  His family history includes Alcohol abuse in his father;  Diabetes in his brother; Heart attack in his mother; Heart disease in his mother; Lung cancer in his father; Stomach cancer in his maternal grandmother and paternal grandmother; Stroke in his father. There is no history of Colon cancer, Esophageal cancer, or Rectal cancer.   Allergies Allergies  Allergen Reactions   Pseudoephedrine Other (See Comments)   Penicillins Rash   Tetracyclines & Related Rash     Home Medications  Prior to Admission medications   Medication Sig Start Date End Date Taking? Authorizing Provider  allopurinol (ZYLOPRIM) 300 MG tablet Take 300 mg by mouth daily. 06/30/19  Yes [provider]  amLODipine  (NORVASC ) 10 MG tablet Take 10 mg by mouth daily. 05/12/19  Yes [provider]  aspirin  EC 81 MG tablet Take 81 mg by mouth daily. Swallow whole.   Yes [provider]  atorvastatin  (LIPITOR ) 80 MG tablet Take 80 mg by mouth at bedtime.    Yes [provider]  BD PEN NEEDLE NANO 2ND GEN 32G X 4 MM MISC  08/12/22  Yes [provider]  cetirizine (ZYRTEC) 10 MG tablet Take 10 mg by mouth daily.    Yes [provider]  citalopram  (CELEXA ) 20 MG tablet Take 20 mg by mouth daily.   Yes [provider]  clopidogrel (PLAVIX) 75 MG tablet Take 75 mg by mouth daily. 08/23/22  Yes [provider]  docusate sodium (COLACE) 100 MG capsule Take 100 mg by mouth daily.   Yes [provider]  FEROSUL 325 (65 Fe) MG tablet TAKE 1 TABLET EVERY OTHER DAY Patient taking differently: Take 325 mg by mouth daily. 03/23/20  Yes Tobb, Kardie, DO  furosemide  (LASIX ) 20 MG tablet Take 20 mg by mouth every other day. 06/11/22  Yes [provider]  gabapentin  (NEURONTIN ) 300 MG capsule Take 2 pills (600 mg) in the morning and 3 pills (900 mg) at bedtime  Patient taking differently: Take 2 pills (600 mg) in the morning and 1 pill (300 mg) at bedtime 10/13/22  Yes Chima, Delon, MD  gemfibrozil  (LOPID ) 600 MG tablet Take  600 mg by mouth 2 (two) times daily before a meal.   Yes [provider]  isosorbide  mononitrate (IMDUR ) 30 MG 24 hr tablet Take 30 mg by mouth daily.   Yes [provider]  losartan  (COZAAR ) 100 MG tablet Take 100 mg by mouth at bedtime.  04/21/17  Yes [provider]  meloxicam (MOBIC) 7.5 MG tablet Take 7.5 mg by mouth 2 (two) times daily. 04/22/21  Yes [provider]  metFORMIN  (GLUCOPHAGE ) 1000 MG tablet Take 1 tablet (1,000 mg total) by mouth 2 (two) times daily with a meal. 10/16/19  Yes End, Lonni, MD  montelukast  (SINGULAIR ) 10 MG tablet Take 10 mg by mouth daily.  04/15/17  Yes [provider]  naphazoline-pheniramine (NAPHCON-A) 0.025-0.3 % ophthalmic solution Place 1 drop into both eyes 3 (three) times daily as needed for eye irritation (dry/irritated/allergy eyes.).    Yes [provider]  nitroGLYCERIN  (NITROSTAT ) 0.4 MG SL tablet Place 0.4 mg under the tongue every 5 (five) minutes as needed for chest pain.   Yes [provider]  NOVOLOG  FLEXPEN 100 UNIT/ML FlexPen Inject 20 Units into the skin in the morning, at noon, and at bedtime.  02/23/19  Yes [provider]  Omega-3 Fatty Acids (OMEGA 3 PO) Take 2 capsules by mouth daily. Omega XL   Yes [provider]  pantoprazole  (PROTONIX ) 40 MG tablet Take 40 mg by mouth daily.   Yes [provider]  potassium chloride  SA (KLOR-CON  M) 20 MEQ tablet Take 20 mEq by mouth daily.   Yes [provider]  tamsulosin  (FLOMAX ) 0.4 MG CAPS capsule Take 0.4 mg by mouth daily.   Yes [provider]  TRESIBA FLEXTOUCH 100 UNIT/ML FlexTouch Pen Inject 70 Units into the skin daily. 04/22/19  Yes [provider]     Critical care time: Not applicable

## 2023-10-25 NOTE — Consult Note (Addendum)
 Consultation Note   Referring Provider:   [facility] PCP: Keren Vicenta BRAVO, MD Primary Gastroenterologist:    Lynnie Bring, MD    Reason for Consultation: Anemia and Hemoccult positive stool DOA: 10/24/2023         Hospital Day: 2   ASSESSMENT    73 year old male with a history of gastric / colonic AVMs with acute on chronic deficiency anemia / FOBT positive in setting of Plavix , asa, AND MELOXICAM Prior EGD with small bowel biopsies/colonoscopy 2021 for IDA remarkable for small gastric and colonic AVMs , small colon polyp. IDA followed by Hematology.  Receives IV iron , oral iron . Presenting hgb 7 (down from baseline of around 12 in May).  May have chronic GI blood loss from erosive disease , PUD, AVMs.  Hemoglobin has improved to 8.8 post transfusion  Intermittent, small-volume rectal bleeding with bowel movements Likely hemorrhoidal.  Not the cause of IDA No rectal masses or blood in vault on DRE today  Acute on chronic diastolic heart failure ( on admission) CAD Elevated Troponins ( felt to be demand ischemia) Troponin 332>> 831  Undergoing diuresis.  Getting echo, may need cardiac catheterization,   Hypoxic respiratory failure ( on admission) in setting of heart failure  Requiring 8 to 10 L 02 CTA negative for PE, + interstitial edema  Chronic GERD On daily pantoprazole   History of CVA On Plavix (on hold)  DM 2  History of colon polyps Small tubular adenoma April 2021.  Surveillance colonoscopy was not recommended due to age  See PMH for any additional medical history  / medical problems  Principal Problem:   Acute hypoxemic respiratory failure (HCC) Active Problems:   Essential hypertension   Hyperlipidemia   Gout   BPH (benign prostatic hyperplasia)   DM (diabetes mellitus) with complications (HCC)   Anemia   Dyslipidemia   Coronary artery disease involving native coronary artery of native heart  without angina pectoris   Hypertension, benign   GI bleed   Demand ischemia (HCC)   History of CVA (cerebrovascular accident)   Obesity (BMI 30-39.9)   PLAN:   --Monitor H&H.  Hemoglobin has improved after unit of blood. -- Adding twice daily PO PPI -- When stable from cardiopulmonary standpoint patient will likely need at least an upper endoscopy. Can discuss further in a couple of days.    HPI   Brief history  patient was seen in our office August 2024 for recurrent iron  deficiency anemia he had had a recent CVA, was on Plavix.  He had multiple comorbidities.  Patient was not a candidate for endoscopic evaluation at that time due to recent CVA.  Plan was to follow-up with us  in 3 months at which time EGD and colonoscopy would be considered if neurology gave clearance.  We have not seen patient since  INTERVAL HISTORY  Patient was admitted yesterday with acute on chronic diastolic heart failure, acute hypoxic respiratory failure.  He has had a dry cough.  Been short of breath. His hemoglobin was 12.4 in May, down to 7 yesterday.  He sometimes sees red blood in stool  (  not much ) when he has a bowel movement .   No associated rectal pain .  No abdominal pain.  No nausea or vomiting.  He takes meloxicam every day as well as a daily PPI.  He is also on a daily baby aspirin .  Medications confirmed with granddaughter over the telephone (she prepares his medications),   Relevant workup thus far: Hemoglobin 7--8.8 post 1 unit PRBCs Ferritin 8 FOBT positive MCV 87 platelets 453  Pertinent GI Studies   EGD 07/2019 - Moderate gastritis.  - A single non-bleeding angiodysplastic lesion in the stomach.  - Multiple duodenal ulcers   Colonoscopy 07/2019 -Fair prep -One 4 mm polyp in the distal sigmoid colon, removed with a cold biopsy forceps. Resected and retrieved. - Minimal sigmoid diverticulosis. - 2 small AVMs. - Non-bleeding external and internal hemorrhoids  Diagnosis 1.  Surgical [P], duodenal - DUODENAL MUCOSA WITH NO SIGNIFICANT PATHOLOGIC FINDINGS. - NEGATIVE FOR INCREASED INTRAEPITHELIAL LYMPHOCYTES AND VILLOUS ARCHITECTURAL CHANGES. 2. Surgical [P], gastric - GASTRIC ANTRAL MUCOSA WITH MILD REACTIVE GASTROPATHY. - GASTRIC OXYNTIC MUCOSA WITH MILD CHRONIC GASTRITIS. - WARTHIN-STARRY STAIN IS NEGATIVE FOR HELICOBACTER PYLORI. 3. Surgical [P], colon, distal sigmoid, polyp - TUBULAR ADENOMA. - NEGATIVE FOR HIGH GRADE DYSPLASIA    Labs and Imaging:  No results for input(s): PROT, ALBUMIN, AST, ALT, ALKPHOS, BILITOT, BILIDIR, IBILI in the last 72 hours. Recent Labs    10/24/23 2213 10/25/23 0431 10/25/23 0539  WBC 10.9*  --   --   HGB 7.0* 8.2* 8.8*  HCT 24.9* 24.0* 26.0*  MCV 87.1  --   --   PLT 453*  --   --    Recent Labs    10/24/23 2213 10/25/23 0431 10/25/23 0539  NA 136 141 139  K 4.3 4.2 4.0  CL 104  --   --   CO2 21*  --   --   GLUCOSE 202*  --   --   BUN 17  --   --   CREATININE 1.27*  --   --   CALCIUM  9.0  --   --      DG Chest Portable 1 View CLINICAL DATA:  73 year old male with choking sensation and shortness of breath.  EXAM: PORTABLE CHEST 1 VIEW  COMPARISON:  CTA chest 0340 hours today and earlier.  FINDINGS: Portable AP semi upright view at 0514 hours. Lordotic positioning. Borderline to mild cardiomegaly, some mediastinal lipomatosis also demonstrated by CT. Stable cardiac size and mediastinal contours.  Visualized tracheal air column is within normal limits.  Small layering pleural effusions better demonstrated by CT. Stable bibasilar hypo ventilation. No pneumothorax or air bronchograms. Interstitial edema suspected by CTA, vascularity appears stable from radiographs yesterday. No acute osseous abnormality identified.  IMPRESSION: Stable ventilation from earlier CTA and radiographs. Suspected interstitial edema, small layering pleural effusions better demonstrated by CTA, bibasilar  hypoventilation.  Electronically Signed   By: VEAR Hurst M.D.   On: 10/25/2023 05:39 CT Angio Chest PE W/Cm &/Or Wo Cm EXAM: CTA of the Chest with contrast for PE 10/25/2023 03:47:31 AM  TECHNIQUE: CTA of the chest was performed after the administration of intravenous contrast. Multiplanar reformatted images are provided for review. MIP images are provided for review. Automated exposure control, iterative reconstruction, and/or weight based adjustment of the mA/kV was utilized to reduce the radiation dose to as low as reasonably achievable.  COMPARISON: Chest radiograph dated 10/24/2023. CTA dated 09/28/2023.  CLINICAL HISTORY: Pulmonary embolism (PE) suspected, high prob.  FINDINGS:  PULMONARY ARTERIES: Pulmonary arteries are adequately opacified for evaluation. No pulmonary embolism. Main pulmonary artery is normal  in caliber.  MEDIASTINUM: The heart and pericardium demonstrate no acute abnormality. Thoracic aortic atherosclerosis. There is no acute abnormality of the thoracic aorta.  LYMPH NODES: 10 mm short axis subcarinal node (image 69), mildly progressive, likely reactive.  LUNGS AND PLEURA: Mild interlobular septal thickening in the upper lobes, suggesting very mild interstitial edema. Suspected mild bilateral lower lobe atelectasis. Trace bilateral pleural effusions, right greater than left.  UPPER ABDOMEN: 4.7 cm right renal cyst (image 153), benign (Bosniak 1), no follow up is recommended.  SOFT TISSUES AND BONES: No acute bone or soft tissue abnormality.  IMPRESSION: 1. No evidence of pulmonary embolism. 2. Suspected mild interstitial edema. Trace bilateral pleural effusions, right greater than left.  Electronically signed by: Pinkie Pebbles MD 10/25/2023 03:52 AM EDT RP Workstation: [id number] CT Head Wo Contrast EXAM: CT HEAD WITHOUT CONTRAST 10/25/2023 03:12:14 AM  TECHNIQUE: CT of the head was performed without the administration of  intravenous contrast. Automated exposure control, iterative reconstruction, and/or weight based adjustment of the mA/kV was utilized to reduce the radiation dose to as low as reasonably achievable.  COMPARISON: 04/02/2023  CLINICAL HISTORY: Mental status change, unknown cause.  FINDINGS:  BRAIN AND VENTRICLES: No acute hemorrhage. No evidence of acute infarct. No hydrocephalus. No extra-axial collection. No mass effect or midline shift.  ORBITS: No acute abnormality.  SINUSES: No acute abnormality.  SOFT TISSUES AND SKULL: No acute soft tissue abnormality. No skull fracture.  IMPRESSION: 1. No acute intracranial abnormality.  Electronically signed by: Franky Stanford MD 10/25/2023 03:20 AM EDT RP Workstation: [id number]    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) 10/13/2019   DDD (degenerative disc disease), cervical    Depression    Dyslipidemia 05/29/2017   Essential hypertension 05/29/2017   Generalized osteoarthritis of multiple sites    Gout    Hyperlipidemia    Hypertension, benign    Laryngopharyngeal reflux    Morbid obesity (HCC) 11/26/2018   Shortness of breath 07/17/2017   Sinus arrhythmia 11/26/2018   Stroke (HCC) 08/2022   [facility] hospitalized   Swelling 05/29/2017   Tobacco abuse    Type 2 diabetes mellitus without complication, without long-term current use of insulin  (HCC) 05/29/2017   Uncontrolled type 2 diabetes mellitus with peripheral neuropathy     Past Surgical History:  Procedure Laterality Date   HERNIA REPAIR  1998   Abdominal   LEFT HEART CATH AND CORONARY ANGIOGRAPHY N/A 10/13/2019   Procedure: LEFT HEART CATH AND  CORONARY ANGIOGRAPHY;  Surgeon: Mady Bruckner, MD;  Location: [facility];  Service: Cardiovascular;  Laterality: N/A;   MINOR HEMORRHOIDECTOMY  1998   NASAL SINUS SURGERY  1978   SHOULDER SURGERY Left    X2   TONSILLECTOMY  1967    Family History  Problem Relation Age of Onset   Heart disease Mother    Heart attack Mother    Stroke Father    Lung cancer Father    Alcohol abuse Father    Diabetes Brother    Stomach cancer Maternal Grandmother    Stomach cancer Paternal Grandmother    Colon cancer Neg Hx    Esophageal cancer Neg Hx  Rectal cancer Neg Hx     Prior to Admission medications   Medication Sig Start Date End Date Taking? Authorizing Provider  allopurinol (ZYLOPRIM) 300 MG tablet Take 300 mg by mouth daily. 06/30/19  Yes [provider]  amLODipine  (NORVASC ) 10 MG tablet Take 10 mg by mouth daily. 05/12/19  Yes [provider]  aspirin  EC 81 MG tablet Take 81 mg by mouth daily. Swallow whole.   Yes [provider]  atorvastatin  (LIPITOR ) 80 MG tablet Take 80 mg by mouth at bedtime.    Yes [provider]  BD PEN NEEDLE NANO 2ND GEN 32G X 4 MM MISC  08/12/22  Yes [provider]  cetirizine (ZYRTEC) 10 MG tablet Take 10 mg by mouth daily.    Yes [provider]  citalopram  (CELEXA ) 20 MG tablet Take 20 mg by mouth daily.   Yes [provider]  clopidogrel (PLAVIX) 75 MG tablet Take 75 mg by mouth daily. 08/23/22  Yes [provider]  docusate sodium (COLACE) 100 MG capsule Take 100 mg by mouth daily.   Yes [provider]  FEROSUL 325 (65 Fe) MG tablet TAKE 1 TABLET EVERY OTHER DAY Patient taking differently: Take 325 mg by mouth daily. 03/23/20  Yes Tobb, Kardie, DO  furosemide  (LASIX ) 20 MG tablet Take 20 mg by mouth every other day. 06/11/22  Yes [provider]  gabapentin  (NEURONTIN ) 300 MG capsule Take 2 pills (600 mg) in the morning and 3 pills (900 mg) at bedtime Patient  taking differently: Take 2 pills (600 mg) in the morning and 1 pill (300 mg) at bedtime 10/13/22  Yes Chima, Delon, MD  gemfibrozil  (LOPID ) 600 MG tablet Take 600 mg by mouth 2 (two) times daily before a meal.   Yes [provider]  isosorbide  mononitrate (IMDUR ) 30 MG 24 hr tablet Take 30 mg by mouth daily.   Yes [provider]  losartan  (COZAAR ) 100 MG tablet Take 100 mg by mouth at bedtime.  04/21/17  Yes [provider]  meloxicam (MOBIC) 7.5 MG tablet Take 7.5 mg by mouth 2 (two) times daily. 04/22/21  Yes [provider]  metFORMIN  (GLUCOPHAGE ) 1000 MG tablet Take 1 tablet (1,000 mg total) by mouth 2 (two) times daily with a meal. 10/16/19  Yes End, Lonni, MD  montelukast  (SINGULAIR ) 10 MG tablet Take 10 mg by mouth daily.  04/15/17  Yes [provider]  naphazoline-pheniramine (NAPHCON-A) 0.025-0.3 % ophthalmic solution Place 1 drop into both eyes 3 (three) times daily as needed for eye irritation (dry/irritated/allergy eyes.).    Yes [provider]  nitroGLYCERIN  (NITROSTAT ) 0.4 MG SL tablet Place 0.4 mg under the tongue every 5 (five) minutes as needed for chest pain.   Yes [provider]  NOVOLOG  FLEXPEN 100 UNIT/ML FlexPen Inject 20 Units into the skin in the morning, at noon, and at bedtime.  02/23/19  Yes [provider]  Omega-3 Fatty Acids (OMEGA 3 PO) Take 2 capsules by mouth daily. Omega XL   Yes [provider]  pantoprazole  (PROTONIX ) 40 MG tablet Take 40 mg by mouth daily.   Yes [provider]  potassium chloride  SA (KLOR-CON  M) 20 MEQ tablet Take 20 mEq by mouth daily.   Yes [provider]  tamsulosin  (FLOMAX ) 0.4 MG CAPS capsule Take 0.4 mg by mouth daily.   Yes [provider]  TRESIBA FLEXTOUCH 100 UNIT/ML FlexTouch Pen Inject 70 Units into the skin daily. 04/22/19  Yes  [provider]    Current Facility-Administered Medications  Medication Dose Route  Frequency Provider Last Rate Last Admin   0.9 %  sodium chloride  infusion  250 mL Intravenous PRN Rojelio Nest, DO       acetaminophen  (TYLENOL ) tablet 650 mg  650 mg Oral Q6H PRN Rojelio Nest, DO       Or   acetaminophen  (TYLENOL ) suppository 650 mg  650 mg Rectal Q6H PRN Rojelio Nest, DO       amLODipine  (NORVASC ) tablet 10 mg  10 mg Oral Daily Rojelio Nest, DO   10 mg at 10/25/23 1015   atorvastatin  (LIPITOR ) tablet 80 mg  80 mg Oral QHS Rojelio Nest, DO       citalopram  (CELEXA ) tablet 20 mg  20 mg Oral Daily Rojelio Nest, DO   20 mg at 10/25/23 1015   ferrous sulfate  tablet 325 mg  325 mg Oral Daily Rojelio Nest, DO   325 mg at 10/25/23 1015   furosemide  (LASIX ) injection 80 mg  80 mg Intravenous BID Chandrasekhar, Mahesh A, MD       HYDROcodone -acetaminophen  (NORCO/VICODIN) 5-325 MG per tablet 1-2 tablet  1-2 tablet Oral Q4H PRN Rojelio Nest, DO       insulin  aspart (novoLOG ) injection 0-9 Units  0-9 Units Subcutaneous Q4H Rojelio Nest, DO   2 Units at 10/25/23 9149   isosorbide  mononitrate (IMDUR ) 24 hr tablet 30 mg  30 mg Oral Daily Rojelio Nest, DO   30 mg at 10/25/23 1015   losartan  (COZAAR ) tablet 100 mg  100 mg Oral QHS Rojelio Nest, DO       ondansetron  (ZOFRAN ) tablet 4 mg  4 mg Oral Q6H PRN Rojelio Nest, DO       Or   ondansetron  (ZOFRAN ) injection 4 mg  4 mg Intravenous Q6H PRN Rojelio Nest, DO       perflutren  lipid microspheres (DEFINITY ) IV suspension  1-10 mL Intravenous PRN Keren Vicenta BRAVO, MD       sodium chloride  flush (NS) 0.9 % injection 3 mL  3 mL Intravenous Q12H Rojelio Nest, DO   3 mL at 10/25/23 1018   sodium chloride  flush (NS) 0.9 % injection 3 mL  3 mL Intravenous PRN Rojelio Nest, DO       tamsulosin  (FLOMAX ) capsule 0.4 mg  0.4 mg Oral Daily Rojelio Nest, DO   0.4 mg at 10/25/23 1015    Allergies as of 10/24/2023 - Review Complete 10/24/2023  Allergen Reaction Noted   Pseudoephedrine Other (See Comments) 11/26/2018    Penicillins Rash 11/26/2018   Tetracyclines & related Rash 11/26/2018    Social History   Socioeconomic History   Marital status: Widowed    Spouse name: Not on file   Number of children: 2   Years of education: 12   Highest education level: Not on file  Occupational History   Occupation: RETIRED - GGODYEAR  Tobacco Use   Smoking status: Former   Smokeless tobacco: Former    Types: Associate Professor status: Never Used  Substance and Sexual Activity   Alcohol use: Not Currently    Comment: occ wine   Drug use: Never   Sexual activity: Yes  Other Topics Concern   Not on file  Social History Narrative   Right handed    Wears readers   Drinks coffee daily    Drinks diet soda 2-3    Social Drivers of Corporate investment banker Strain: Not  on file  Food Insecurity: Food Insecurity Present (10/09/2022)   Hunger Vital Sign    Worried About Running Out of Food in the Last Year: Never true    Ran Out of Food in the Last Year: Sometimes true  Transportation Needs: No Transportation Needs (10/09/2022)   PRAPARE - Administrator, Civil Service (Medical): No    Lack of Transportation (Non-Medical): No  Physical Activity: Not on file  Stress: Not on file  Social Connections: Not on file  Intimate Partner Violence: Not At Risk (10/09/2022)   Humiliation, Afraid, Rape, and Kick questionnaire    Fear of Current or Ex-Partner: No    Emotionally Abused: No    Physically Abused: No    Sexually Abused: No     Code Status   Code Status: Full Code  Review of Systems: Recent discomfort across chest  / collar bones. All other systems reviewed and negative except where noted in HPI.  Physical Exam: Vital signs in last 24 hours: Temp:  [97.6 F (36.4 C)-99.1 F (37.3 C)] 98 F (36.7 C) (09/21 1403) Pulse Rate:  [34-116] 81 (09/21 1403) Resp:  [16-37] 19 (09/21 1403) BP: (125-173)/(58-143) 132/76 (09/21 1403) SpO2:  [84 %-100 %] 99 % (09/21 1403) FiO2 (%):   [10 %] 10 % (09/21 0825) Weight:  [871 kg] 128 kg (09/20 2157)    General:  Pleasant male in NAD Psych:  Cooperative. Normal mood and affect Eyes: Pupils equal Ears:  Normal auditory acuity Nose: No deformity, discharge or lesions Neck:  Supple, no masses felt Lungs: Breathing is unlabored . 02 per [place]. Lungs clear to auscultation anteriorly.  Heart:  Regular rate.  Abdomen:  Soft, nondistended, nontender, active bowel sounds, no masses felt Rectal : No masses felt.  No stool or blood in vault on DRE  Msk: Symmetrical without gross deformities.  Neurologic:  Alert, oriented, grossly normal neurologically Extremities : No edema Skin:  Intact without significant lesions.    Intake/Output from previous day: 09/20 0701 - 09/21 0700 In: -  Out: 600 [Urine:600] Intake/Output this shift:  Total I/O In: 350 [Blood:350] Out: 2140 [Urine:2140]   Vina Dasen, NP-C   10/25/2023, 3:41 PM  -----------------------------------------------------------------------------  I have taken a history, reviewed the chart and examined the patient. I performed a substantive portion of this encounter, including complete performance of at least one of the key components, in conjunction with the APP. I agree with the APP's note, impression and recommendations  73 year old male with numerous comorbidities to include coronary artery disease, stroke, obesity, sleep apnea, CHF, as well as chronic iron  deficiency anemia with previous endoscopic evaluation for IDA notable for peptic ulcer disease and intestinal AVMs, admitted with shortness of breath, hypoxic respiratory failure, noted to have hemoglobin of 7, down from baseline of 12 few months ago.  Fecal occult positive. Patient is poor historian, but does report bright red blood per rectum, mostly on toilet paper.  Unclear if he has been having black tarry stools per conversation with the patient.  He has been taking NSAIDs in addition to aspirin  and  Plavix. Patient has pulmonary edema secondary to acute on chronic diastolic heart failure as well as elevated troponins with some ST depressions, likely demand ischemia in setting of severe anemia.  Cardiology has evaluated patient and is not planning for immediate catheterization, favoring medical therapy and optimization of fluid status and correction of anemia.  Currently, the patient is not stable to undergo nonemergent endoscopic evaluation.  When  his cardiopulmonary is more improved, I would recommend an EGD.  Colonoscopy could also be considered, but upper GI source is more likely.  Continue to monitor stool appearance and hemoglobin.  Hold aspirin /Plavix for now, but these may be restarted if no concern for bleeding over the next day or so.  The patient most likely has a chronic GI bleed from AVMs based on prior evaluations, not a high risk lesion such as an ulcer, and the benefits of antiplatelet therapy for his coronary artery disease may outweigh risk of ongoing low-grade GI bleeding.  Dr. Albertus will be taking over inpatient GI services tomorrow.  Blakley Michna E. Stacia, MD [facility]  High complex medical decision making (this includes chart review, review of results, face-to-face time used for counseling as well as treatment plan and follow-up. The patient was provided an opportunity to ask questions and all were answered. The patient agreed with the plan and demonstrated an understanding of the instructions

## 2023-10-25 NOTE — Consult Note (Addendum)
 Cardiology Consultation   Patient ID: [name] Williams MRN: [id number]; DOB: March 30, 1950  Admit date: 10/24/2023 Date of Consult: 10/25/2023  PCP:  Keren Vicenta BRAVO, MD   [facility] Providers Cardiologist:  Dub Huntsman, DO   Patient Profile: [name] Williams is a 73 y.o. male with a hx of CAD with medically managed disease, IDA followed by oncology, hypertension, hyperlipidemia, diabetes, CVA who is being seen 10/25/2023 for the evaluation of acute respiratory failure at the request of critical care.  History of Present Illness: Mr. Teuscher patient had abnormal coronary CTA 05/2019 which prompted cardiac catheterization 10/2019 with moderate to severe, noncritical two-vessel CAD with 50% proximal/mid LAD stenosis as well as 60-70% proximal RCA lesions.70% stenosis involving rPL1.  Due to his chronic issues of anemia decision was to medically manage him.  There was consideration of intervention to his RCA if he were to have persistent symptoms despite antianginal therapy.  EF has been preserved.  Since then seems to have had stable CAD.  He was last seen 05/2021 with no plans for revascularization.  He also has history of CVA and chronically down aspirin  and Plavix.  Due to his history of persistent IDA oncology had recommended evaluation with GI but had not following up yet.  Reportedly had recent colonoscopy with Dr. Charlanne 8/22.  Currently patient being evaluated for acute hypoxic respiratory failure requiring 8 to 10 L via high flow nasal cannula.  Reportedly had desatted into the 80s.  Had positive FOBT with a hemoglobin of 7 transfused with 1 unit of blood.  Troponins [phone].  Cardiology asked to see for worsening respiratory failure.  He has been started on IV Lasix  40 mg twice daily  Today patient is accompanied with his son and son's wife.  He personally is a poor historian.  He lives at he home independently but daughter will come by and help him but otherwise functional and  able to complete ADLs.  For the past week they have been noting worsening/progressive chest pain and shortness of breath.  He was at a barbecue yesterday and went to the bathroom came back acutely short of breath and appeared pale, diaphoretic with acute onset of chest pain.  He describes this as a heaviness.  Also reports chronic blood in his stool but family thinks he also has had a long history of hemorrhoids.  Not clearly worsened recently.  After starting the IV Lasix  his shortness of breath has improved significantly.  Not currently having any chest pain.  BNP 390.  CTA negative for PE.  Mild interstitial edema.  Trace pleural effusions.  Labs from yesterday show potassium 4.3, creatinine 1.29.  Hemoglobin 7-8 0.2-8.8.  WBC 10.9.  Platelets 453.  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) 10/13/2019   DDD (degenerative disc disease), cervical    Depression    Dyslipidemia 05/29/2017   Essential hypertension 05/29/2017   Generalized osteoarthritis of multiple sites    Gout    Hyperlipidemia    Hypertension, benign    Laryngopharyngeal reflux    Morbid obesity (HCC) 11/26/2018   Shortness of breath 07/17/2017   Sinus arrhythmia 11/26/2018   Stroke (HCC)  08/2022   RH hospitalized   Swelling 05/29/2017   Tobacco abuse    Type 2 diabetes mellitus without complication, without long-term current use of insulin  (HCC) 05/29/2017   Uncontrolled type 2 diabetes mellitus with peripheral neuropathy     Past Surgical History:  Procedure Laterality Date   HERNIA REPAIR  1998   Abdominal   LEFT HEART CATH AND CORONARY ANGIOGRAPHY N/A 10/13/2019   Procedure: LEFT HEART CATH AND  CORONARY ANGIOGRAPHY;  Surgeon: Mady Bruckner, MD;  Location: [facility];  Service: Cardiovascular;  Laterality: N/A;   MINOR HEMORRHOIDECTOMY  1998   NASAL SINUS SURGERY  1978   SHOULDER SURGERY Left    X2   TONSILLECTOMY  1967    Scheduled Meds:  amLODipine   10 mg Oral Daily   atorvastatin   80 mg Oral QHS   citalopram   20 mg Oral Daily   ferrous sulfate   325 mg Oral Daily   furosemide   40 mg Intravenous BID   insulin  aspart  0-9 Units Subcutaneous Q4H   isosorbide  mononitrate  30 mg Oral Daily   losartan   100 mg Oral QHS   sodium chloride  flush  3 mL Intravenous Q12H   tamsulosin   0.4 mg Oral Daily   Continuous Infusions:  sodium chloride      PRN Meds: sodium chloride , acetaminophen  **OR** acetaminophen , HYDROcodone -acetaminophen , ondansetron  **OR** ondansetron  (ZOFRAN ) IV, sodium chloride  flush  Allergies:    Allergies  Allergen Reactions   Pseudoephedrine Other (See Comments)   Penicillins Rash   Tetracyclines & Related Rash    Social History:   Social History   Socioeconomic History   Marital status: Widowed    Spouse name: Not on file   Number of children: 2   Years of education: 12   Highest education level: Not on file  Occupational History   Occupation: RETIRED - GGODYEAR  Tobacco Use   Smoking status: Former   Smokeless tobacco: Former    Types: Associate Professor status: Never Used  Substance and Sexual Activity   Alcohol use: Not Currently    Comment: occ wine   Drug use: Never   Sexual activity: Yes  Other Topics Concern   Not on file  Social History Narrative   Right handed    Wears readers   Drinks coffee daily    Drinks diet soda 2-3    Social Drivers of Corporate investment banker Strain: Not on file  Food Insecurity: Food Insecurity Present (10/09/2022)   Hunger Vital Sign    Worried About Running Out of Food in the Last Year: Never true    Ran Out of Food in the Last Year: Sometimes true  Transportation Needs:  No Transportation Needs (10/09/2022)   PRAPARE - Administrator, Civil Service (Medical): No    Lack of Transportation (Non-Medical): No  Physical Activity: Not on file  Stress: Not on file  Social Connections: Not on file  Intimate Partner Violence: Not At Risk (10/09/2022)   Humiliation, Afraid, Rape, and Kick questionnaire    Fear of Current or Ex-Partner: No    Emotionally Abused: No    Physically Abused: No    Sexually Abused: No    Family History:   Family History  Problem Relation Age of Onset   Heart disease Mother    Heart attack Mother    Stroke Father    Lung cancer Father    Alcohol abuse Father    Diabetes Brother  Stomach cancer Maternal Grandmother    Stomach cancer Paternal Grandmother    Colon cancer Neg Hx    Esophageal cancer Neg Hx    Rectal cancer Neg Hx      ROS:  Please see the history of present illness.  All other ROS reviewed and negative.     Physical Exam/Data: Vitals:   10/25/23 1030 10/25/23 1045 10/25/23 1100 10/25/23 1200  BP: (!) 146/67  (!) 125/58   Pulse:   87   Resp: 20 19 19    Temp:    98.3 F (36.8 C)  TempSrc:    Oral  SpO2: 99% 99% 95%   Weight:      Height:        Intake/Output Summary (Last 24 hours) at 10/25/2023 1403 Last data filed at 10/25/2023 1223 Gross per 24 hour  Intake 350 ml  Output 2740 ml  Net -2390 ml      10/24/2023    9:57 PM 06/30/2023    8:30 AM 06/22/2023    8:08 AM  Last 3 Weights  Weight (lbs) 282 lb 3 oz 283 lb 1.9 oz 284 lb 12 oz  Weight (kg) 128 kg 128.422 kg 129.162 kg     Body mass index is 39.36 kg/m.  General:  Well nourished, well developed, in no acute distress HEENT: normal Neck: Difficult to assess JVD Vascular: No carotid bruits; Distal pulses 2+ bilaterally Cardiac:  normal S1, S2; RRR; no murmur  Lungs:  diffuse crackles abd: soft, nontender, no hepatomegaly  Ext: no edema Musculoskeletal:  No deformities, BUE and BLE strength normal and equal Skin: warm and dry   Neuro:  CNs 2-12 intact, no focal abnormalities noted Psych:  Normal affect   EKG:  The EKG was personally reviewed and demonstrates: Sinus rhythm subtle ST depression laterally.  Slight ST elevation in lead III. Telemetry:  Telemetry was personally reviewed and demonstrates: Sinus rhythm heart rates 80s  Relevant CV Studies: Left heart catheterization 10/13/2019 onclusions: Moderate to severe, non-critical two vessel coronary artery disease with sequential 50% proximal/mid LAD stenoses as well as tandem 60-70% proximal RCA lesions.  There is also a 70% stenosis involving rPL1. Grossly normal left ventricular systolic function with moderately elevated filling pressure (LVEDP 25-30 mmHg). Tortuous right subclavian artery limiting catheter manipulation.  Consider use of a long radial sheath versus alternate access if catheterization is needed in the future.   Recommendations: Given recent severe anemia and improving symptoms with rising hemoglobin, as well as moderately elevated LVEDP, I favor optimization of medical therapy.  I will increase furosemide  to 40 mg BID and add isosorbide  mononitrate 30 mg daily.  If Mr. Rumbold has refractory symptoms despite optimal medical therapy, PCI to the RCA could be considered.  He should be challenged with dual antiplatelet therapy before proceeding with PCI to ensure that he does not experience recurrent GI bleeding. Continue aggressive secondary prevention of coronary artery disease.  Laboratory Data: High Sensitivity Troponin:   Recent Labs  Lab 10/24/23 2213 10/25/23 0016 10/25/23 0516 10/25/23 0713  TROPONINIHS 52* 54* 154* 332*     Chemistry Recent Labs  Lab 10/24/23 2213 10/25/23 0431 10/25/23 0539 10/25/23 0713  NA 136 141 139  --   K 4.3 4.2 4.0  --   CL 104  --   --   --   CO2 21*  --   --   --   GLUCOSE 202*  --   --   --   BUN 17  --   --   --  CREATININE 1.27*  --   --   --   CALCIUM  9.0  --   --   --   MG  --   --   --  1.7   GFRNONAA 59*  --   --   --   ANIONGAP 11  --   --   --     No results for input(s): PROT, ALBUMIN, AST, ALT, ALKPHOS, BILITOT in the last 168 hours. Lipids No results for input(s): CHOL, TRIG, HDL, LABVLDL, LDLCALC, CHOLHDL in the last 168 hours.  Hematology Recent Labs  Lab 10/24/23 2213 10/25/23 0431 10/25/23 0539  WBC 10.9*  --   --   RBC 2.86*  --   --   HGB 7.0* 8.2* 8.8*  HCT 24.9* 24.0* 26.0*  MCV 87.1  --   --   MCH 24.5*  --   --   MCHC 28.1*  --   --   RDW 15.9*  --   --   PLT 453*  --   --    Thyroid  No results for input(s): TSH, FREET4 in the last 168 hours.  BNP Recent Labs  Lab 10/25/23 0419  BNP 390.7*    DDimer No results for input(s): DDIMER in the last 168 hours.  Radiology/Studies:  DG Chest Portable 1 View Result Date: 10/25/2023 CLINICAL DATA:  73 year old male with choking sensation and shortness of breath. EXAM: PORTABLE CHEST 1 VIEW COMPARISON:  CTA chest 0340 hours today and earlier. FINDINGS: Portable AP semi upright view at 0514 hours. Lordotic positioning. Borderline to mild cardiomegaly, some mediastinal lipomatosis also demonstrated by CT. Stable cardiac size and mediastinal contours. Visualized tracheal air column is within normal limits. Small layering pleural effusions better demonstrated by CT. Stable bibasilar hypo ventilation. No pneumothorax or air bronchograms. Interstitial edema suspected by CTA, vascularity appears stable from radiographs yesterday. No acute osseous abnormality identified. IMPRESSION: Stable ventilation from earlier CTA and radiographs. Suspected interstitial edema, small layering pleural effusions better demonstrated by CTA, bibasilar hypoventilation. Electronically Signed   By: VEAR Hurst M.D.   On: 10/25/2023 05:39   CT Angio Chest PE W/Cm &/Or Wo Cm Result Date: 10/25/2023 EXAM: CTA of the Chest with contrast for PE 10/25/2023 03:47:31 AM TECHNIQUE: CTA of the chest was performed after the  administration of intravenous contrast. Multiplanar reformatted images are provided for review. MIP images are provided for review. Automated exposure control, iterative reconstruction, and/or weight based adjustment of the mA/kV was utilized to reduce the radiation dose to as low as reasonably achievable. COMPARISON: Chest radiograph dated 10/24/2023. CTA dated 09/28/2023. CLINICAL HISTORY: Pulmonary embolism (PE) suspected, high prob. FINDINGS: PULMONARY ARTERIES: Pulmonary arteries are adequately opacified for evaluation. No pulmonary embolism. Main pulmonary artery is normal in caliber. MEDIASTINUM: The heart and pericardium demonstrate no acute abnormality. Thoracic aortic atherosclerosis. There is no acute abnormality of the thoracic aorta. LYMPH NODES: 10 mm short axis subcarinal node (image 69), mildly progressive, likely reactive. LUNGS AND PLEURA: Mild interlobular septal thickening in the upper lobes, suggesting very mild interstitial edema. Suspected mild bilateral lower lobe atelectasis. Trace bilateral pleural effusions, right greater than left. UPPER ABDOMEN: 4.7 cm right renal cyst (image 153), benign (Bosniak 1), no follow up is recommended. SOFT TISSUES AND BONES: No acute bone or soft tissue abnormality. IMPRESSION: 1. No evidence of pulmonary embolism. 2. Suspected mild interstitial edema. Trace bilateral pleural effusions, right greater than left. Electronically signed by: Pinkie Pebbles MD 10/25/2023 03:52 AM EDT RP Workstation: [id number]  CT Head Wo Contrast Result Date: 10/25/2023 EXAM: CT HEAD WITHOUT CONTRAST 10/25/2023 03:12:14 AM TECHNIQUE: CT of the head was performed without the administration of intravenous contrast. Automated exposure control, iterative reconstruction, and/or weight based adjustment of the mA/kV was utilized to reduce the radiation dose to as low as reasonably achievable. COMPARISON: 04/02/2023 CLINICAL HISTORY: Mental status change, unknown cause. FINDINGS:  BRAIN AND VENTRICLES: No acute hemorrhage. No evidence of acute infarct. No hydrocephalus. No extra-axial collection. No mass effect or midline shift. ORBITS: No acute abnormality. SINUSES: No acute abnormality. SOFT TISSUES AND SKULL: No acute soft tissue abnormality. No skull fracture. IMPRESSION: 1. No acute intracranial abnormality. Electronically signed by: Franky Stanford MD 10/25/2023 03:20 AM EDT RP Workstation: [id number]   DG Chest 2 View Result Date: 10/24/2023 CLINICAL DATA:  Choking sensation. EXAM: DG CHEST 2V COMPARISON:  Portable chest 04/01/2023, CTA chest 09/28/2023. FINDINGS: Mild-to-moderate cardiomegaly. No vascular congestion is seen. The sulci are sharp. The lungs are clear. Stable mediastinum. Vascular prominence right paratracheal stripe. Aortic atherosclerosis. First spondylosis. Mild osteopenia and thoracic kyphosis. Thoracic cage is intact. Compare: Stable chest. IMPRESSION: 1. No evidence of acute chest disease. 2. Cardiomegaly. 3. Aortic atherosclerosis. Electronically Signed   By: Francis Quam M.D.   On: 10/24/2023 22:44     Assessment and Plan:  Elevated Trops CAD LHC 10/2019 with moderate to severe noncritical two-vessel CAD in his proximal/mid LAD with 60 to 70% RCA stenosis that was being medically managed given prior history of persistent anemia.  PCI of the RCA was considered if recurrence of angina.  EKG showing subtle lateral ST depressions and some elevation in lead III but not super convincing for ACS.  Troponins went from [phone].  May represent demand ischemia given acute respiratory failure, CHF exacerbation, and anemia.  Either way with positive FOBT requiring 1 unit of blood he needs GI evaluation before considering further ischemic evaluation and/or repeat cardiac catheterization. Will cycle another set of troponins Was on aspirin  and Plavix chronically for history of stroke.  Defer to GI about antiplatelet therapy/anticoagulation. Getting  echocardiogram Continue with atorvastatin  80 mg  Acute HFpEF Acute hypoxic respiratory failure - Preserved EF 03/2023 Requiring 8 to 10 L of high flow nasal cannula, reportedly desatted into the 80s.  Presentation not entirely consistent with CHF exacerbation as hypoxia seems disproportionate to his level of heart failure, previously preserved.  His blood gas also seems inconsistent with this as well.  Critical care not convinced of any other secondary pathologies but still in question. Shortness of breath is improving with IV diuresis and he is looking improved. Continue with IV Lasix  40 mg twice daily, reporting good urinary output. Get echocardiogram.  If EF has dropped this would be a stronger indication to pursue catheterization as long as he is stable from a GI standpoint. GDMT: Continue losartan  100 mg.  Plenty of blood pressure room.  Will titrate GDMT as needed after echocardiogram.  IDA Persistently low hemoglobin.  Likely chronic but need GI input.  Oncology wanted GI to see but never seen outpatient.  I will order iron  panel.  History of CVA Has been on aspirin  and Plavix for this.  Currently on hold.   Risk Assessment/Risk Scores:  [place]  Heart Association (NYHA) Functional Class NYHA Class III    For questions or updates, please contact [facility] Please consult [email or web address] for contact info under   Signed, Thom LITTIE Sluder, PA-C  10/25/2023 2:03 PM   I have personally evaluated and examined  the patient. The history, physical exam, and medical decision making documented below were performed independently and substantively by me. I have reviewed all relevant data, formulated the assessment and plan, and assumed responsibility for the management of this patient. My documentation reflects the substantive portion of the split/shared visit, in accordance with CMS and CPT guidelines. I have updated the NPP documentation above as appropriate.  I have personally  performed the substantive portion of the medical decision making, including interpretation of diagnostic data, formulation of the management plan, and assessment of risks. In summation:  Mr. Depaula with a history of stroke, presents with shortness of breath and chest pain. He is accompanied by his niece, Tommy, virtually and his son and DIL at bedside.  We have seen him int he ED and assisted in transition to 4E12.  He experienced an episode of shortness of breath and chest pain while at church, leading to his transport to the hospital by ambulance. The patients brother and sister-in-law were present at the time of the incident and other family is here presently. He has been experiencing shortness of breath for over a week, which was not initially recognized by his family. He describes a sensation of 'something stuck in his throat' and has had mucus production. No fever or chills are associated with these symptoms.  He does not remember these symptoms.  He has a history of a stroke approximately a year ago and has been on Plavix and Aspirin  since then. There is concern about potential bleeding due to his history of stomach ulcers and bleeding hemorrhoids thought he does not actively note these symptoms.  His blood counts are lower than expected, and he has received blood transfusion int he ED as part of his care. His symptoms are not associated with fever or chills or a productive cough  Exam notable for  Gen: no distress, morbid obesity   Neck: Thick neck Cardiac: No Rubs or Gallops, no murmur, RRR +2 radial pulses distant heart sounds Respiratory: Decreased breath sounds bilaterally, normal effort, normal  respiratory rate GI: Soft, nontender, non-distended  MS: Non pitting edema;  moves all extremities Integument: Skin feels warm Neuro:  At time of evaluation, alert and oriented to person/place/time/situation  Psych: Normal affect, patient feels ok  Personally reviewed data and interpretation:    CT scan: calcified atherosclerotic plaque formation in the LAD, RCA, and LCX.  Aortic atherosclerosis. Prominent interstitial edema with small bilateral effusions.  EKG SR with LVH and secondary repolarization with slight Inferior ST depression  BNP 391 Post transfusion Hgb 8.8  2021- evidence of obstructive CAD, medically managed in the setting of GI bleed  In assessment and plan:   Acute hypoxemic respiratory failure due to volume overload with pulmonary edema Acute hypoxemic respiratory failure likely secondary to volume overload with pulmonary edema. Oxygen  levels have been improving with treatment. No evidence of infection. Differential includes heart failure, but further evaluation is needed to confirm the etiology of the fluid overload. - Wean oxygen  slowly to assess respiratory status - Increase to 80 mg IV BID - echo ordered and pending   Chest pain and shortness of breath, under evaluation Intermittent chest pain and shortness of breath reported. No evidence of acute myocardial infarction. Troponin levels slightly elevated, possibly due to fluid overload. Shortness of breath may have been present for over a week, potentially related to fluid overload or other underlying conditions. - likely this is demand ischemia from in RPL disease (70% in 2021) superimposed on volume  overload and anemia; of note, patient was unable to get PCI at that time due to inability for DAPT in the setting of GI bleed  Anemia, unspecified Anemia with low blood counts. Possible causes include gastrointestinal bleeding, given ulcers and hemorrhoids. No overt signs of bleeding observed. Blood transfusion has been initiated by internal medicine. - Pending GI eval, I ordered iron  studies given his GI history, if low despite blood transfusion, consider IV iron   Stroke and DM - he may leave on single platelet regimen if no need for PCI in the setting of pre-disposition to bleeding  Stanly Leavens,  MD F[facility] Cardiologist [facility]  [address], [address] [place], [place] [address] [phone]  2:10 PM

## 2023-10-25 NOTE — Progress Notes (Signed)
 Patient received from ED, he is alert and oriented, V/S obtained, CCMD notified, CHG bath given, all needs met, call bell in reach.   10/25/23 1403  Vitals  Temp 98 F (36.7 C)  Temp Source Oral  BP 132/76  MAP (mmHg) 92  BP Location Right Arm  BP Method Automatic  Patient Position (if appropriate) Lying  Pulse Rate 81  Pulse Rate Source Monitor  ECG Heart Rate 82  Resp 19  Level of Consciousness  Level of Consciousness Alert  MEWS COLOR  MEWS Score Color Green  Oxygen  Therapy  SpO2 99 %  O2 Device Room Air  Pain Assessment  Pain Scale 0-10  Pain Score 0  MEWS Score  MEWS Temp 0  MEWS Systolic 0  MEWS Pulse 0  MEWS RR 0  MEWS LOC 0  MEWS Score 0

## 2023-10-26 ENCOUNTER — Other Ambulatory Visit (HOSPITAL_COMMUNITY): Payer: Self-pay

## 2023-10-26 ENCOUNTER — Telehealth (HOSPITAL_COMMUNITY): Payer: Self-pay | Admitting: Pharmacy Technician

## 2023-10-26 ENCOUNTER — Encounter: Payer: Self-pay | Admitting: Oncology

## 2023-10-26 DIAGNOSIS — R195 Other fecal abnormalities: Secondary | ICD-10-CM

## 2023-10-26 DIAGNOSIS — I219 Acute myocardial infarction, unspecified: Secondary | ICD-10-CM

## 2023-10-26 DIAGNOSIS — K5520 Angiodysplasia of colon without hemorrhage: Secondary | ICD-10-CM

## 2023-10-26 DIAGNOSIS — I5033 Acute on chronic diastolic (congestive) heart failure: Secondary | ICD-10-CM | POA: Diagnosis not present

## 2023-10-26 DIAGNOSIS — I2510 Atherosclerotic heart disease of native coronary artery without angina pectoris: Secondary | ICD-10-CM | POA: Diagnosis not present

## 2023-10-26 DIAGNOSIS — D500 Iron deficiency anemia secondary to blood loss (chronic): Secondary | ICD-10-CM | POA: Diagnosis not present

## 2023-10-26 DIAGNOSIS — J9601 Acute respiratory failure with hypoxia: Secondary | ICD-10-CM

## 2023-10-26 DIAGNOSIS — Z860101 Personal history of adenomatous and serrated colon polyps: Secondary | ICD-10-CM

## 2023-10-26 LAB — BPAM RBC
Blood Product Expiration Date: 202510022359
ISSUE DATE / TIME: 202509210402
Unit Type and Rh: 600

## 2023-10-26 LAB — CBC
HCT: 27.5 % — ABNORMAL LOW (ref 39.0–52.0)
Hemoglobin: 8.1 g/dL — ABNORMAL LOW (ref 13.0–17.0)
MCH: 24.5 pg — ABNORMAL LOW (ref 26.0–34.0)
MCHC: 29.5 g/dL — ABNORMAL LOW (ref 30.0–36.0)
MCV: 83.3 fL (ref 80.0–100.0)
Platelets: 414 K/uL — ABNORMAL HIGH (ref 150–400)
RBC: 3.3 MIL/uL — ABNORMAL LOW (ref 4.22–5.81)
RDW: 15.7 % — ABNORMAL HIGH (ref 11.5–15.5)
WBC: 9.4 K/uL (ref 4.0–10.5)
nRBC: 0 % (ref 0.0–0.2)

## 2023-10-26 LAB — GLUCOSE, CAPILLARY
Glucose-Capillary: 159 mg/dL — ABNORMAL HIGH (ref 70–99)
Glucose-Capillary: 126 mg/dL — ABNORMAL HIGH (ref 70–99)
Glucose-Capillary: 240 mg/dL — ABNORMAL HIGH (ref 70–99)
Glucose-Capillary: 229 mg/dL — ABNORMAL HIGH (ref 70–99)
Glucose-Capillary: 147 mg/dL — ABNORMAL HIGH (ref 70–99)

## 2023-10-26 LAB — ECHOCARDIOGRAM COMPLETE
Area-P 1/2: 4.42 cm2
Calc EF: 43.4 %
Height: 71 in
S' Lateral: 4.1 cm
Single Plane A2C EF: 36.5 %
Single Plane A4C EF: 50.7 %
Weight: 4515.02 [oz_av]

## 2023-10-26 LAB — BASIC METABOLIC PANEL WITH GFR
Anion gap: 11 (ref 5–15)
BUN: 15 mg/dL (ref 8–23)
CO2: 28 mmol/L (ref 22–32)
Calcium: 9.4 mg/dL (ref 8.9–10.3)
Chloride: 98 mmol/L (ref 98–111)
Creatinine, Ser: 1.34 mg/dL — ABNORMAL HIGH (ref 0.61–1.24)
GFR, Estimated: 56 mL/min — ABNORMAL LOW (ref >60)
Glucose, Bld: 141 mg/dL — ABNORMAL HIGH (ref 70–99)
Potassium: 3.1 mmol/L — ABNORMAL LOW (ref 3.5–5.1)
Sodium: 137 mmol/L (ref 135–145)

## 2023-10-26 LAB — TYPE AND SCREEN
ABO/RH(D): AB NEG
Antibody Screen: NEGATIVE
Unit division: 0

## 2023-10-26 LAB — MAGNESIUM: Magnesium: 1.7 mg/dL (ref 1.7–2.4)

## 2023-10-26 MED ORDER — SODIUM CHLORIDE 0.9 % IV SOLN
300.0000 mg | Freq: Once | INTRAVENOUS | Status: AC
  Administered 2023-10-26: 300 mg via INTRAVENOUS
  Filled 2023-10-26: qty 15

## 2023-10-26 MED ORDER — BISACODYL 5 MG PO TBEC
10.0000 mg | DELAYED_RELEASE_TABLET | Freq: Once | ORAL | Status: AC
Start: 2023-10-26 — End: 2023-10-26
  Administered 2023-10-26: 10 mg via ORAL
  Filled 2023-10-26: qty 2

## 2023-10-26 MED ORDER — POTASSIUM CHLORIDE CRYS ER 20 MEQ PO TBCR
40.0000 meq | EXTENDED_RELEASE_TABLET | ORAL | Status: AC
  Administered 2023-10-26 (×2): 40 meq via ORAL
  Filled 2023-10-26 (×2): qty 2

## 2023-10-26 MED ORDER — INSULIN ASPART 100 UNIT/ML IJ SOLN
0.0000 [IU] | Freq: Three times a day (TID) | INTRAMUSCULAR | Status: DC
  Administered 2023-10-26: 5 [IU] via SUBCUTANEOUS
  Administered 2023-10-27 (×2): 2 [IU] via SUBCUTANEOUS
  Administered 2023-10-27: 3 [IU] via SUBCUTANEOUS
  Administered 2023-10-28: 5 [IU] via SUBCUTANEOUS
  Administered 2023-10-29: 2 [IU] via SUBCUTANEOUS
  Administered 2023-10-29: 3 [IU] via SUBCUTANEOUS

## 2023-10-26 MED ORDER — IRON SUCROSE 300 MG IVPB - SIMPLE MED
300.0000 mg | Freq: Once | Status: DC
  Filled 2023-10-26: qty 265

## 2023-10-26 MED ORDER — METOPROLOL TARTRATE 50 MG PO TABS
50.0000 mg | ORAL_TABLET | Freq: Two times a day (BID) | ORAL | Status: DC
  Administered 2023-10-26: 50 mg via ORAL
  Filled 2023-10-26: qty 1

## 2023-10-26 MED ORDER — APIXABAN 5 MG PO TABS
5.0000 mg | ORAL_TABLET | Freq: Two times a day (BID) | ORAL | Status: DC
  Administered 2023-10-26: 5 mg via ORAL
  Filled 2023-10-26: qty 1

## 2023-10-26 MED ORDER — MAGNESIUM SULFATE 2 GM/50ML IV SOLN
2.0000 g | Freq: Once | INTRAVENOUS | Status: AC
  Administered 2023-10-26: 2 g via INTRAVENOUS
  Filled 2023-10-26: qty 50

## 2023-10-26 MED ORDER — INSULIN ASPART 100 UNIT/ML IJ SOLN: 0.0000 [IU] | Freq: Every day | INTRAMUSCULAR | Status: DC

## 2023-10-26 MED ORDER — AMLODIPINE BESYLATE 5 MG PO TABS
5.0000 mg | ORAL_TABLET | Freq: Every day | ORAL | Status: DC
Start: 2023-10-26 — End: 2023-10-27
  Administered 2023-10-26 – 2023-10-27 (×2): 5 mg via ORAL
  Filled 2023-10-26 (×2): qty 1

## 2023-10-26 MED ORDER — DAPAGLIFLOZIN PROPANEDIOL 10 MG PO TABS
10.0000 mg | ORAL_TABLET | Freq: Every day | ORAL | Status: DC
  Administered 2023-10-26 – 2023-10-29 (×4): 10 mg via ORAL
  Filled 2023-10-26 (×4): qty 1

## 2023-10-26 NOTE — Telephone Encounter (Signed)
 Patient Product/process development scientist completed.    The patient is insured through [employer]. Patient has Medicare and is not eligible for a copay card, but may be able to apply for patient assistance or Medicare RX Payment Plan (Patient Must reach out to their plan, if eligible for payment plan), if available.    Ran test claim for Farxiga 10 mg and the current 30 day co-pay is $0.00.  Ran test claim for Jardiance 10 mg and the current 30 day co-pay is $0.00.  This test claim was processed through [facility]- copay amounts may vary at other pharmacies due to pharmacy/plan contracts, or as the patient moves through the different stages of their insurance plan.     Roland Earl, CPHT Pharmacy Technician III Certified Patient Advocate [facility] Patient Advocate Team Direct Number: [phone]  Fax: [phone]

## 2023-10-26 NOTE — Progress Notes (Addendum)
 [facility] Progress Note  CC: Anemia, heme positive stool  Subjective: He denies having any nausea or vomiting.  He is tolerating a heart healthy diet.  No abdominal pain.  He passed a large solid black stool earlier today.  No bright red blood per the rectum.  On ferrous sulfate .  No chest pain or shortness of breath.  No family at the bedside.   Objective:  Vital signs in last 24 hours: Temp:  [97.6 F (36.4 C)-98.2 F (36.8 C)] (P) 97.6 F (36.4 C) (09/22 1100) Pulse Rate:  [77-92] 77 (09/22 0829) Resp:  [15-21] 17 (09/22 1100) BP: (124-144)/(49-75) (P) 112/58 (09/22 1100) SpO2:  [90 %-100 %] 90 % (09/22 0829) Last BM Date : 10/26/23 General: Alert 73 year old male in no acute distress. Heart: Distant S1, S2, no murmurs. Pulm: Breath sounds clear, few crackles in the bases bilaterally. Abdomen: Obese abdomen, soft and nontender.  Positive bowel sounds to all 4 quadrants.  No palpable mass. Extremities: No significant lower extremity edema.  Dressings to the distal shins dry and intact. Neurologic:  Alert and oriented x 4.  Moves all extremities equally.  Speech is clear. Psych:  Alert and cooperative. Normal mood and affect.  Intake/Output from previous day: 09/21 0701 - 09/22 0700 In: 350 [Blood:350] Out: 2740 [Urine:2740] Intake/Output this shift: Total I/O In: 600 [P.O.:600] Out: 525 [Urine:525]  Lab Results: Recent Labs    10/24/23 2213 10/25/23 0431 10/25/23 0539 10/26/23 0402  WBC 10.9*  --   --  9.4  HGB 7.0* 8.2* 8.8* 8.1*  HCT 24.9* 24.0* 26.0* 27.5*  PLT 453*  --   --  414*   BMET Recent Labs    10/24/23 2213 10/25/23 0431 10/25/23 0539 10/25/23 0713 10/26/23 0402  NA 136   < > 139 137 137  K 4.3   < > 4.0 3.6 3.1*  CL 104  --   --  99 98  CO2 21*  --   --  22 28  GLUCOSE 202*  --   --  120* 141*  BUN 17  --   --  14 15  CREATININE 1.27*  --   --  1.07 1.34*  CALCIUM  9.0  --   --  9.4 9.4   < > = values in this interval  not displayed.   LFT Recent Labs    10/25/23 0713  ALBUMIN 3.9   PT/INR No results for input(s): LABPROT, INR in the last 72 hours. Hepatitis Panel No results for input(s): HEPBSAG, HCVAB, HEPAIGM, HEPBIGM in the last 72 hours.  ECHOCARDIOGRAM COMPLETE Result Date: 10/26/2023    ECHOCARDIOGRAM REPORT   Patient Name:   [name] Williams Date of Exam: 10/25/2023 Medical Rec #:  [id number]      Height:       71.0 in Accession #:    [id number]     Weight:       282.2 lb Date of Birth:  December 28, 1950       BSA:          2.441 m Patient Age:    73 years       BP:           137/76 mmHg Patient Gender: M              HR:           85 bpm. Exam Location:  Inpatient Procedure: 2D Echo, Cardiac Doppler, Color Doppler and Intracardiac  Opacification Agent (Both Spectral and Color Flow Doppler were            utilized during procedure).                                 MODIFIED REPORT:   This report was modified by Jerel Balding MD on 10/26/2023 due to Corrected                         report of left ventricular size.  Indications:     R07.9* Chest pain, unspecified. Elevated troponin  History:         Patient has prior history of Echocardiogram examinations, most                  recent 04/02/2023. CAD, Stroke, Signs/Symptoms:Dyspnea,                  Shortness of Breath and Chest Pain; Risk Factors:Hypertension,                  Diabetes, Dyslipidemia and Current Smoker.  Sonographer:     Ellouise Mose RDCS Referring Phys:  [id number] JENNIFER CHOI Diagnosing Phys: Jerel Balding MD  Sonographer Comments: Technically difficult study due to poor echo windows, Technically challenging study due to limited acoustic windows and patient is obese. IMPRESSIONS  1. Left ventricular ejection fraction, by estimation, is 50 to 55%. Left ventricular ejection fraction by 2D MOD biplane is 43.4 %. The left ventricle has low normal function. The left ventricle has no regional wall motion abnormalities. There is mild  concentric left ventricular hypertrophy. Left ventricular diastolic parameters are consistent with Grade II diastolic dysfunction (pseudonormalization). Elevated left atrial pressure.  2. Right ventricular systolic function is normal. The right ventricular size is normal. Tricuspid regurgitation signal is inadequate for assessing PA pressure.  3. Left atrial size was mildly dilated.  4. The mitral valve was not well visualized. Trivial mitral valve regurgitation.  5. The aortic valve is tricuspid. There is mild calcification of the aortic valve. Aortic valve regurgitation is mild. Aortic valve sclerosis/calcification is present, without any evidence of aortic stenosis.  6. Aortic dilatation noted. There is mild dilatation of the aortic root, measuring 45 mm. There is mild dilatation of the ascending aorta, measuring 41 mm. Comparison(s): There is now evidence of elevated mean left atrial pressure. FINDINGS  Left Ventricle: Left ventricular ejection fraction, by estimation, is 50 to 55%. Left ventricular ejection fraction by 2D MOD biplane is 43.4 %. The left ventricle has low normal function. The left ventricle has no regional wall motion abnormalities. Definity  contrast agent was given IV to delineate the left ventricular endocardial borders. The left ventricular internal cavity size was normal in size. There is mild concentric left ventricular hypertrophy. Left ventricular diastolic parameters are consistent with Grade II diastolic dysfunction (pseudonormalization). Elevated left atrial pressure. Right Ventricle: The right ventricular size is normal. No increase in right ventricular wall thickness. Right ventricular systolic function is normal. Tricuspid regurgitation signal is inadequate for assessing PA pressure. Left Atrium: Left atrial size was mildly dilated. Right Atrium: Right atrial size was normal in size. Pericardium: There is no evidence of pericardial effusion. Presence of epicardial fat layer. Mitral  Valve: The mitral valve was not well visualized. Trivial mitral valve regurgitation. Tricuspid Valve: The tricuspid valve is normal in structure. Tricuspid valve regurgitation is trivial. No evidence of tricuspid stenosis.  Aortic Valve: The aortic valve is tricuspid. There is mild calcification of the aortic valve. Aortic valve regurgitation is mild. Aortic valve sclerosis/calcification is present, without any evidence of aortic stenosis. Pulmonic Valve: The pulmonic valve was grossly normal. Pulmonic valve regurgitation is not visualized. No evidence of pulmonic stenosis. Aorta: Aortic dilatation noted. There is mild dilatation of the aortic root, measuring 45 mm. There is mild dilatation of the ascending aorta, measuring 41 mm. Venous: The inferior vena cava was not well visualized. IAS/Shunts: The interatrial septum was not well visualized.  LEFT VENTRICLE PLAX 2D                        Biplane EF (MOD) LVIDd:         5.40 cm         LV Biplane EF:   Left LVIDs:         4.10 cm                          ventricular LV PW:         1.50 cm                          ejection LV IVS:        1.40 cm                          fraction by LVOT diam:     2.50 cm                          2D MOD LV SV:         94                               biplane is LV SV Index:   39                               43.4 %. LVOT Area:     4.91 cm                                Diastology                                LV e' medial:    6.00 cm/s LV Volumes (MOD)               LV E/e' medial:  18.3 LV vol d, MOD    122.0 ml      LV e' lateral:   9.00 cm/s A2C:                           LV E/e' lateral: 12.2 LV vol d, MOD    167.0 ml A4C: LV vol s, MOD    77.5 ml A2C: LV vol s, MOD    82.3 ml A4C: LV SV MOD A2C:   44.5 ml LV SV MOD A4C:   167.0 ml LV SV MOD BP:    61.2 ml RIGHT VENTRICLE RV S prime:     15.10 cm/s TAPSE (M-mode): 1.8  cm LEFT ATRIUM             Index        RIGHT ATRIUM           Index LA diam:        4.40 cm 1.80 cm/m   RA  Area:     12.10 cm LA Vol (A2C):   55.8 ml 22.86 ml/m  RA Volume:   25.40 ml  10.41 ml/m LA Vol (A4C):   76.5 ml 31.34 ml/m LA Biplane Vol: 68.8 ml 28.19 ml/m  AORTIC VALVE LVOT Vmax:   57.80 cm/s LVOT Vmean:  67.900 cm/s LVOT VTI:    0.192 m  AORTA Ao Root diam: 4.45 cm Ao Asc diam:  4.10 cm MITRAL VALVE MV Area (PHT): 4.42 cm     SHUNTS MV Decel Time: 172 msec     Systemic VTI:  0.19 m MV E velocity: 110.00 cm/s  Systemic Diam: 2.50 cm MV A velocity: 97.60 cm/s MV E/A ratio:  1.13 Mihai Croitoru MD Electronically signed by Jerel Balding MD Signature Date/Time: 10/25/2023/5:18:33 PM    Final (Updated)    DG Chest Portable 1 View Result Date: 10/25/2023 CLINICAL DATA:  73 year old male with choking sensation and shortness of breath. EXAM: PORTABLE CHEST 1 VIEW COMPARISON:  CTA chest 0340 hours today and earlier. FINDINGS: Portable AP semi upright view at 0514 hours. Lordotic positioning. Borderline to mild cardiomegaly, some mediastinal lipomatosis also demonstrated by CT. Stable cardiac size and mediastinal contours. Visualized tracheal air column is within normal limits. Small layering pleural effusions better demonstrated by CT. Stable bibasilar hypo ventilation. No pneumothorax or air bronchograms. Interstitial edema suspected by CTA, vascularity appears stable from radiographs yesterday. No acute osseous abnormality identified. IMPRESSION: Stable ventilation from earlier CTA and radiographs. Suspected interstitial edema, small layering pleural effusions better demonstrated by CTA, bibasilar hypoventilation. Electronically Signed   By: VEAR Hurst M.D.   On: 10/25/2023 05:39   CT Angio Chest PE W/Cm &/Or Wo Cm Result Date: 10/25/2023 EXAM: CTA of the Chest with contrast for PE 10/25/2023 03:47:31 AM TECHNIQUE: CTA of the chest was performed after the administration of intravenous contrast. Multiplanar reformatted images are provided for review. MIP images are provided for review. Automated exposure  control, iterative reconstruction, and/or weight based adjustment of the mA/kV was utilized to reduce the radiation dose to as low as reasonably achievable. COMPARISON: Chest radiograph dated 10/24/2023. CTA dated 09/28/2023. CLINICAL HISTORY: Pulmonary embolism (PE) suspected, high prob. FINDINGS: PULMONARY ARTERIES: Pulmonary arteries are adequately opacified for evaluation. No pulmonary embolism. Main pulmonary artery is normal in caliber. MEDIASTINUM: The heart and pericardium demonstrate no acute abnormality. Thoracic aortic atherosclerosis. There is no acute abnormality of the thoracic aorta. LYMPH NODES: 10 mm short axis subcarinal node (image 69), mildly progressive, likely reactive. LUNGS AND PLEURA: Mild interlobular septal thickening in the upper lobes, suggesting very mild interstitial edema. Suspected mild bilateral lower lobe atelectasis. Trace bilateral pleural effusions, right greater than left. UPPER ABDOMEN: 4.7 cm right renal cyst (image 153), benign (Bosniak 1), no follow up is recommended. SOFT TISSUES AND BONES: No acute bone or soft tissue abnormality. IMPRESSION: 1. No evidence of pulmonary embolism. 2. Suspected mild interstitial edema. Trace bilateral pleural effusions, right greater than left. Electronically signed by: Pinkie Pebbles MD 10/25/2023 03:52 AM EDT RP Workstation: [id number]   CT Head Wo Contrast Result Date: 10/25/2023 EXAM: CT HEAD WITHOUT CONTRAST 10/25/2023 03:12:14 AM TECHNIQUE: CT of the head was performed without the administration  of intravenous contrast. Automated exposure control, iterative reconstruction, and/or weight based adjustment of the mA/kV was utilized to reduce the radiation dose to as low as reasonably achievable. COMPARISON: 04/02/2023 CLINICAL HISTORY: Mental status change, unknown cause. FINDINGS: BRAIN AND VENTRICLES: No acute hemorrhage. No evidence of acute infarct. No hydrocephalus. No extra-axial collection. No mass effect or midline shift.  ORBITS: No acute abnormality. SINUSES: No acute abnormality. SOFT TISSUES AND SKULL: No acute soft tissue abnormality. No skull fracture. IMPRESSION: 1. No acute intracranial abnormality. Electronically signed by: Franky Stanford MD 10/25/2023 03:20 AM EDT RP Workstation: [id number]   DG Chest 2 View Result Date: 10/24/2023 CLINICAL DATA:  Choking sensation. EXAM: DG CHEST 2V COMPARISON:  Portable chest 04/01/2023, CTA chest 09/28/2023. FINDINGS: Mild-to-moderate cardiomegaly. No vascular congestion is seen. The sulci are sharp. The lungs are clear. Stable mediastinum. Vascular prominence right paratracheal stripe. Aortic atherosclerosis. First spondylosis. Mild osteopenia and thoracic kyphosis. Thoracic cage is intact. Compare: Stable chest. IMPRESSION: 1. No evidence of acute chest disease. 2. Cardiomegaly. 3. Aortic atherosclerosis. Electronically Signed   By: Francis Quam M.D.   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 Prior EGD with small bowel biopsies/colonoscopy 2021 for IDA remarkable for small gastric and colonic AVMs, small colon polyp. IDA followed by Hematology. Admission Hg 7 (down from baseline of 12 in May) -> transfused one unit of PRBCs -> Hg 8.2 -> 8.8 -> today Hg 8.1. Suspect chronic GI blood loss from erosive disease, PUD and AVMs.  Patient endorsed passing a large solid black stool earlier today.  On oral iron . - CBC in am - Transfuse for hemoglobin level less than 8 - Avoid all NSAIDs - Defer endoscopic recommendations per Dr. Albertus   Intermittent, small-volume rectal bleeding with bowel movements. Likely hemorrhoidal. No rectal masses or blood in vault on DRE 9/21. Colonoscopy 05/2019 showed a small tubular adenomatous polyp removed from the colon and colonic AVMs.   Acute on chronic diastolic heart failure (on admission).  ECHO 9/21 showed LVEF 50 to 55% with grade 2 diastolic dysfunction.  Received Lasix , diuresed 2 L.  CAD. Elevated Troponins  (felt to be demand ischemia). Troponin 332>> 831.  In setting of anemia with suspected GI bleed, cardiology recommending Plavix 75 mg daily monotherapy and no ASA if okay with neurology/stroke team per cardiology note today per Dr. Francyne. However, patient was started on Eliquis  today.  - Management per cardiology   Acute hypoxic respiratory failure (on admission) in setting of heart failure. CTA negative for PE showed evidence of interstitial edema and trace bilateral pleural effusions, R > L.  Weaned off oxygen .  Respiratory status significantly improved after diuresed almost 2 L.  Chronic GERD  AKI, Cr 1.07 -> 1.34.    History of CVA, about 6 months ago.  Plavix on hold, last dose 9/20.   DM 2   History of colon polyps Small tubular adenoma April 2021.  Surveillance colonoscopy was not recommended due to age  Hypokalemia. K+ 3.1.  - Management per the hospitalist    Principal Problem:   Acute hypoxemic respiratory failure (HCC) Active Problems:   Essential hypertension   Hyperlipidemia   Gout   BPH (benign prostatic hyperplasia)   DM (diabetes mellitus) with complications (HCC)   Symptomatic anemia   Dyslipidemia   Coronary artery disease involving native coronary  artery of native heart without angina pectoris   Hypertension, benign   GI bleed   Demand ischemia (HCC)   History of CVA (cerebrovascular accident)   Obesity (BMI 30-39.9)   Acute diastolic heart failure (HCC)     LOS: 1 day   Elida CHRISTELLA Shawl  10/26/2023, 3:27 PM  Addendum: I have taken a history, reviewed the chart and examined the patient. I performed a substantive portion of this encounter, including complete performance of at least one of the key components, in conjunction with the APP. I agree with the APP's note, impression and recommendations with additional input as  follows.  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 Acute on chronic diastolic heart failure and acute hypoxic respiratory failure on admission in the setting of heart failure.  Improved with diuretics.  IV iron /Venofer  given today, will need additional doses as an outpatient.  Cardiology planning to resume Plavix  With known prior gastric and colonic angioectasias have recommended upper endoscopy and colonoscopy prior to Plavix reinitiation and prior to discharge.  Higher than baseline risk of endoscopic procedures.  The nature of the procedure, as well as the risks, benefits, and alternatives were carefully and thoroughly reviewed with the patient. Ample time for discussion and questions allowed. The patient understood, was satisfied, and agreed to proceed.   Dulcolax tonight, clear liquids now. Clear liquids tomorrow with plans for bowel prep tomorrow evening  EGD and colonoscopy on Wednesday assuming he remains appropriate Continue to hold Plavix until after endoscopic procedures given the high likelihood of needing APC for ablation of angioectasias.  50 minutes total spent today including patient facing time, coordination of care, reviewing medical history/procedures/pertinent radiology studies, and documentation of the encounter.

## 2023-10-26 NOTE — Progress Notes (Signed)
 PROGRESS NOTE  [name] Williams FMW:[id number] DOB: [birth year]   PCP: Keren Vicenta BRAVO, MD  Patient is from: Home.  He is widowed.  Lives alone.  Has 2 sons and multiple siblings who lives in [place]. Uses cane at baseline.  DOA: 10/24/2023 LOS: 1  Chief complaints Chief Complaint  Patient presents with   choking sensation     Brief Narrative / Interim history: 73 year old M with PMH of HFpEF, CVA on Plavix and aspirin , CAD, DM-2, HTN, BPH, gout, HLD, obesity, anxiety and depression presenting with acute shortness of breath for 1 day with associated dry cough.  Also intermittent hematochezia on a stool for a while.  Patient is a very poor historian.  Denies over-the-counter NSAIDs.  Reportedly last took his medications on Saturday.  Plan daughter help with medications.    In ED, desaturated to 84% and required up to 3 L by HFNC.  Hgb 7.0.  Hemoccult positive. Admitted with working diagnosis of acute respiratory failure with hypoxia due to CHF exacerbation and acute blood loss anemia.  Started on IV Lasix .  1 unit of blood ordered.  PCCM and GI consulted.  Cardiology consulted later in the day.  Lasix  increased.  TTE with LVEF of 50 to 55% and G2 DD.  Patient was liberated of oxygen .   Subjective: Seen and examined earlier this morning.  Patient was confused and pulled his IV out last night.  No complaints this morning.  He denies chest pain, shortness of breath, cough, nausea, vomiting, abdominal pain.  He admits to intermittent scant hematochezia.  He is oriented x 4 but poor historian.  Objective: Vitals:   10/25/23 2025 10/25/23 2030 10/25/23 2035 10/26/23 0829  BP:   (!) 124/49 137/70  Pulse: 85 85 82 77  Resp: 18 15 20 17   Temp:   98.2 F (36.8 C) 97.9 F (36.6 C)  TempSrc:   Oral Axillary  SpO2: 99% 100% 98% 90%  Weight:      Height:        Examination:  GENERAL: No apparent distress.  Nontoxic. HEENT: MMM.  Vision and hearing grossly intact.  NECK: Supple.  No  apparent JVD.  RESP:  No IWOB.  Fair aeration bilaterally. CVS:  RRR. Heart sounds normal.  ABD/GI/GU: BS+. Abd soft, NTND.  MSK/EXT:  Moves extremities. No apparent deformity. No edema.  SKIN: no apparent skin lesion or wound NEURO: AA.  Oriented x 4.  No apparent focal neuro deficit. PSYCH: Calm. Normal affect.   Consultants:  Critical. Cardiology Gastroenterology  Procedures: None  Microbiology summarized: None  Assessment and plan: Acute hypoxemic respiratory failure: Desaturated to 84% and required up to 3 L by HFNC on admission.  Likely due to CHF exacerbation.  - Manage CHF as below  Acute on chronic diastolic CHF: Suspect noncompliance with diuretics.  Reportedly, he took his medications last on Saturday.  Also suspect dietary noncompliance.  CXR and CTA chest suggested interstitial edema and small layering pleural effusions.  BNP 390.  TTE with LVEF of 50 to 55% and G2 DD.  Diuresed with IV Lasix .  Net -2.8 L.  Cr slightly up but not far from baseline.  Respiratory failure resolved. -Appreciate help by cardiology -Strict intake and output, daily weight, renal functions and electrolytes -Decrease amlodipine  with the hope to wean off completely    Acute on chronic blood loss iron  deficiency anemia in the setting of lower GIB:  Significant drop in Hgb in the last 4 months. Chronically on Plavix  and aspirin  for CVA.  Reportedly last dose is on 9/20.  Anemia panel suggested severe iron  deficiency.  Transfused 1 unit with appropriate response. Recent Labs    02/09/23 1025 06/15/23 1441 10/24/23 2213 10/25/23 0431 10/25/23 0539 10/26/23 0402  HGB 12.0* 12.4* 7.0* 8.2* 8.8* 8.1*  - Hold Plavix and aspirin .  I do not think he should be on both of them at the same time  - IV Venofer  300 mg x 1 - Continue Protonix  - Appreciate help by GI - Continue clear liquid diet  Elevated troponin/history of CAD: Troponin is slowly trended from 52-1000.  Concerning for non-STEMI but  patient without chest pain.  Seems to have some ST depression in lateral leads without dynamic change.  Unclear if this is new.  No RWMA on TTE.  LHC in 2021 with moderate to severe proximal/mid LAD stenosis as well as tandem 60 to 70% proximal RCA lesion.  -Defer to cardiology.  Not able to anticoagulate or use antiplatelets due to GI bleed -Continue Lipitor .   History of CVA: Does not seem to have residual deficit.  On aspirin  and Plavix chronically. - Continue holding aspirin , Plavix - Continue home Lipitor   IDDM-2 with hyperglycemia: A1c 6.5%. Recent Labs  Lab 10/25/23 1617 10/25/23 1944 10/25/23 2357 10/26/23 0332 10/26/23 0826  GLUCAP 135* 184* 112* 159* 126*  - Continue SSI-sensitive   Hypertension: Normotensive - Norvasc , Cozaar , Imdur , diuretics   BPH -Continue home Flomax   Mood disorder/insomnia - Continue home Celexa  and melatonin  Hypokalemia -Monitor replenish K and Mg as appropriate  Delirium: Currently awake and alert and oriented x 4 but not a great historian.  He was confused and pulled IV line last night. No formal diagnosis of cognitive impairment -Delirium precaution   Morbid obesity: Elevated BMI with diabetes and other comorbidities as above Body mass index is 39.36 kg/m.           DVT prophylaxis:  SCDs Start: 10/25/23 0846  Code Status: Full code Family Communication: None at bedside Level of care: Progressive Status is: Inpatient Remains inpatient appropriate because: Acute CHF, possible non-STEMI   Final disposition: Likely home once medically stable   55 minutes with more than 50% spent in reviewing records, counseling patient/family and coordinating care.   Sch Meds:  Scheduled Meds:  amLODipine   5 mg Oral Daily   atorvastatin   80 mg Oral QHS   citalopram   20 mg Oral Daily   ferrous sulfate   325 mg Oral Daily   furosemide   80 mg Intravenous BID   hydrocerin   Topical BID   insulin  aspart  0-9 Units Subcutaneous Q4H    isosorbide  mononitrate  30 mg Oral Daily   loratadine   10 mg Oral Daily   losartan   100 mg Oral QHS   pantoprazole  (PROTONIX ) IV  40 mg Intravenous Q12H   potassium chloride   40 mEq Oral Q3H   sodium chloride  flush  3 mL Intravenous Q12H   tamsulosin   0.4 mg Oral Daily   Continuous Infusions:  iron  sucrose     magnesium  sulfate bolus IVPB     PRN Meds:.acetaminophen  **OR** acetaminophen , HYDROcodone -acetaminophen , melatonin, ondansetron  **OR** ondansetron  (ZOFRAN ) IV, sodium chloride  flush  Antimicrobials: Anti-infectives (From admission, onward)    None        I have personally reviewed the following labs and images: CBC: Recent Labs  Lab 10/24/23 2213 10/25/23 0431 10/25/23 0539 10/26/23 0402  WBC 10.9*  --   --  9.4  HGB 7.0* 8.2*  8.8* 8.1*  HCT 24.9* 24.0* 26.0* 27.5*  MCV 87.1  --   --  83.3  PLT 453*  --   --  414*   BMP &GFR Recent Labs  Lab 10/24/23 2213 10/25/23 0431 10/25/23 0539 10/25/23 0713 10/26/23 0402  NA 136 141 139 137 137  K 4.3 4.2 4.0 3.6 3.1*  CL 104  --   --  99 98  CO2 21*  --   --  22 28  GLUCOSE 202*  --   --  120* 141*  BUN 17  --   --  14 15  CREATININE 1.27*  --   --  1.07 1.34*  CALCIUM  9.0  --   --  9.4 9.4  MG  --   --   --  1.7 1.7  PHOS  --   --   --  3.5  --    Estimated Creatinine Clearance: 65.9 mL/min (A) (by C-G formula based on SCr of 1.34 mg/dL (H)). Liver & Pancreas: Recent Labs  Lab 10/25/23 0713  ALBUMIN 3.9   No results for input(s): LIPASE, AMYLASE in the last 168 hours. No results for input(s): AMMONIA in the last 168 hours. Diabetic: Recent Labs    10/25/23 0858  HGBA1C 6.5*   Recent Labs  Lab 10/25/23 1617 10/25/23 1944 10/25/23 2357 10/26/23 0332 10/26/23 0826  GLUCAP 135* 184* 112* 159* 126*   Cardiac Enzymes: No results for input(s): CKTOTAL, CKMB, CKMBINDEX, TROPONINI in the last 168 hours. No results for input(s): PROBNP in the last 8760 hours. Coagulation  Profile: No results for input(s): INR, PROTIME in the last 168 hours. Thyroid  Function Tests: No results for input(s): TSH, T4TOTAL, FREET4, T3FREE, THYROIDAB in the last 72 hours. Lipid Profile: No results for input(s): CHOL, HDL, LDLCALC, TRIG, CHOLHDL, LDLDIRECT in the last 72 hours. Anemia Panel: Recent Labs    10/25/23 1437  FERRITIN 8*  TIBC 643*  IRON  218*   Urine analysis:    Component Value Date/Time   COLORURINE YELLOW 05/18/2019 1533   APPEARANCEUR CLEAR 05/18/2019 1533   LABSPEC 1.015 05/18/2019 1533   PHURINE 5.0 05/18/2019 1533   GLUCOSEU NEGATIVE 05/18/2019 1533   HGBUR NEGATIVE 05/18/2019 1533   BILIRUBINUR NEGATIVE 05/18/2019 1533   KETONESUR NEGATIVE 05/18/2019 1533   PROTEINUR NEGATIVE 05/18/2019 1533   NITRITE NEGATIVE 05/18/2019 1533   LEUKOCYTESUR NEGATIVE 05/18/2019 1533   Sepsis Labs: Invalid input(s): PROCALCITONIN, LACTICIDVEN  Microbiology: No results found for this or any previous visit (from the past 240 hours).  Radiology Studies: ECHOCARDIOGRAM COMPLETE Result Date: 10/26/2023    ECHOCARDIOGRAM REPORT   Patient Name:   REYNARD CHRISTOFFERSEN Date of Exam: 10/25/2023 Medical Rec #:  [id number]      Height:       71.0 in Accession #:    [id number]     Weight:       282.2 lb Date of Birth:  August 12, 1950       BSA:          2.441 m Patient Age:    73 years       BP:           137/76 mmHg Patient Gender: M              HR:           85 bpm. Exam Location:  Inpatient Procedure: 2D Echo, Cardiac Doppler, Color Doppler and Intracardiac  Opacification Agent (Both Spectral and Color Flow Doppler were            utilized during procedure).                                 MODIFIED REPORT:   This report was modified by Jerel Balding MD on 10/26/2023 due to Corrected                         report of left ventricular size.  Indications:     R07.9* Chest pain, unspecified. Elevated troponin  History:         Patient has prior history of  Echocardiogram examinations, most                  recent 04/02/2023. CAD, Stroke, Signs/Symptoms:Dyspnea,                  Shortness of Breath and Chest Pain; Risk Factors:Hypertension,                  Diabetes, Dyslipidemia and Current Smoker.  Sonographer:     Ellouise Mose RDCS Referring Phys:  [id number] JENNIFER CHOI Diagnosing Phys: Jerel Balding MD  Sonographer Comments: Technically difficult study due to poor echo windows, Technically challenging study due to limited acoustic windows and patient is obese. IMPRESSIONS  1. Left ventricular ejection fraction, by estimation, is 50 to 55%. Left ventricular ejection fraction by 2D MOD biplane is 43.4 %. The left ventricle has low normal function. The left ventricle has no regional wall motion abnormalities. There is mild concentric left ventricular hypertrophy. Left ventricular diastolic parameters are consistent with Grade II diastolic dysfunction (pseudonormalization). Elevated left atrial pressure.  2. Right ventricular systolic function is normal. The right ventricular size is normal. Tricuspid regurgitation signal is inadequate for assessing PA pressure.  3. Left atrial size was mildly dilated.  4. The mitral valve was not well visualized. Trivial mitral valve regurgitation.  5. The aortic valve is tricuspid. There is mild calcification of the aortic valve. Aortic valve regurgitation is mild. Aortic valve sclerosis/calcification is present, without any evidence of aortic stenosis.  6. Aortic dilatation noted. There is mild dilatation of the aortic root, measuring 45 mm. There is mild dilatation of the ascending aorta, measuring 41 mm. Comparison(s): There is now evidence of elevated mean left atrial pressure. FINDINGS  Left Ventricle: Left ventricular ejection fraction, by estimation, is 50 to 55%. Left ventricular ejection fraction by 2D MOD biplane is 43.4 %. The left ventricle has low normal function. The left ventricle has no regional wall motion  abnormalities. Definity  contrast agent was given IV to delineate the left ventricular endocardial borders. The left ventricular internal cavity size was normal in size. There is mild concentric left ventricular hypertrophy. Left ventricular diastolic parameters are consistent with Grade II diastolic dysfunction (pseudonormalization). Elevated left atrial pressure. Right Ventricle: The right ventricular size is normal. No increase in right ventricular wall thickness. Right ventricular systolic function is normal. Tricuspid regurgitation signal is inadequate for assessing PA pressure. Left Atrium: Left atrial size was mildly dilated. Right Atrium: Right atrial size was normal in size. Pericardium: There is no evidence of pericardial effusion. Presence of epicardial fat layer. Mitral Valve: The mitral valve was not well visualized. Trivial mitral valve regurgitation. Tricuspid Valve: The tricuspid valve is normal in structure. Tricuspid valve regurgitation is trivial. No evidence of tricuspid stenosis.  Aortic Valve: The aortic valve is tricuspid. There is mild calcification of the aortic valve. Aortic valve regurgitation is mild. Aortic valve sclerosis/calcification is present, without any evidence of aortic stenosis. Pulmonic Valve: The pulmonic valve was grossly normal. Pulmonic valve regurgitation is not visualized. No evidence of pulmonic stenosis. Aorta: Aortic dilatation noted. There is mild dilatation of the aortic root, measuring 45 mm. There is mild dilatation of the ascending aorta, measuring 41 mm. Venous: The inferior vena cava was not well visualized. IAS/Shunts: The interatrial septum was not well visualized.  LEFT VENTRICLE PLAX 2D                        Biplane EF (MOD) LVIDd:         5.40 cm         LV Biplane EF:   Left LVIDs:         4.10 cm                          ventricular LV PW:         1.50 cm                          ejection LV IVS:        1.40 cm                          fraction by LVOT  diam:     2.50 cm                          2D MOD LV SV:         94                               biplane is LV SV Index:   39                               43.4 %. LVOT Area:     4.91 cm                                Diastology                                LV e' medial:    6.00 cm/s LV Volumes (MOD)               LV E/e' medial:  18.3 LV vol d, MOD    122.0 ml      LV e' lateral:   9.00 cm/s A2C:                           LV E/e' lateral: 12.2 LV vol d, MOD    167.0 ml A4C: LV vol s, MOD    77.5 ml A2C: LV vol s, MOD    82.3 ml A4C: LV SV MOD A2C:   44.5 ml LV SV MOD A4C:   167.0 ml LV SV MOD BP:    61.2 ml RIGHT VENTRICLE RV S prime:     15.10 cm/s TAPSE (M-mode):  1.8 cm LEFT ATRIUM             Index        RIGHT ATRIUM           Index LA diam:        4.40 cm 1.80 cm/m   RA Area:     12.10 cm LA Vol (A2C):   55.8 ml 22.86 ml/m  RA Volume:   25.40 ml  10.41 ml/m LA Vol (A4C):   76.5 ml 31.34 ml/m LA Biplane Vol: 68.8 ml 28.19 ml/m  AORTIC VALVE LVOT Vmax:   57.80 cm/s LVOT Vmean:  67.900 cm/s LVOT VTI:    0.192 m  AORTA Ao Root diam: 4.45 cm Ao Asc diam:  4.10 cm MITRAL VALVE MV Area (PHT): 4.42 cm     SHUNTS MV Decel Time: 172 msec     Systemic VTI:  0.19 m MV E velocity: 110.00 cm/s  Systemic Diam: 2.50 cm MV A velocity: 97.60 cm/s MV E/A ratio:  1.13 Mihai Croitoru MD Electronically signed by Jerel Balding MD Signature Date/Time: 10/25/2023/5:18:33 PM    Final (Updated)       Savannah Morford T. Trystin Terhune [facility]  If 7PM-7AM, please contact night-coverage [email or web address] 10/26/2023, 11:16 AM

## 2023-10-26 NOTE — Progress Notes (Addendum)
 Progress Note  Patient Name: [name] Williams Date of Encounter: 10/26/2023 [facility] Cardiologist: Kardie Tobb, DO   Interval Summary   Breathing is markedly improved.  He no longer requires oxygen .  He has not been out of bed yet today, but he is able to lie completely horizontal in bed without dyspnea.  He has not had any chest pain.  Excellent diuresis, net negative almost 2 L.  Vital Signs Vitals:   10/25/23 2025 10/25/23 2030 10/25/23 2035 10/26/23 0829  BP:   (!) 124/49 137/70  Pulse: 85 85 82 77  Resp: 18 15 20 17   Temp:   98.2 F (36.8 C) 97.9 F (36.6 C)  TempSrc:   Oral Axillary  SpO2: 99% 100% 98% 90%  Weight:      Height:        Intake/Output Summary (Last 24 hours) at 10/26/2023 0934 Last data filed at 10/26/2023 0830 Gross per 24 hour  Intake --  Output 1825 ml  Net -1825 ml      10/24/2023    9:57 PM 06/30/2023    8:30 AM 06/22/2023    8:08 AM  Last 3 Weights  Weight (lbs) 282 lb 3 oz 283 lb 1.9 oz 284 lb 12 oz  Weight (kg) 128 kg 128.422 kg 129.162 kg      Telemetry/ECG  ECG shows sinus rhythm with PVCs and first-degree AV block. Telemetry shows sinus rhythm with relatively frequent PVCs- Personally Reviewed Echocardiogram 10/25/2023 1. Left ventricular ejection fraction, by estimation, is 50 to 55%. Left  ventricular ejection fraction by 2D MOD biplane is 43.4 %. The left  ventricle has low normal function. The left ventricle has no regional wall  motion abnormalities. The left  ventricular internal cavity size was severely dilated. There is mild  concentric left ventricular hypertrophy. Left ventricular diastolic  parameters are consistent with Grade II diastolic dysfunction  (pseudonormalization). Elevated left atrial pressure.   2. Right ventricular systolic function is normal. The right ventricular  size is normal. Tricuspid regurgitation signal is inadequate for assessing  PA pressure.   3. Left atrial size was mildly dilated.   4.  The mitral valve was not well visualized. Trivial mitral valve  regurgitation.   5. The aortic valve is tricuspid. There is mild calcification of the  aortic valve. Aortic valve regurgitation is mild. Aortic valve  sclerosis/calcification is present, without any evidence of aortic  stenosis.   6. Aortic dilatation noted. There is mild dilatation of the aortic root,  measuring 45 mm. There is mild dilatation of the ascending aorta,  measuring 41 mm.   Comparison(s): There is now evidence of elevated mean left atrial  pressure.    Left heart catheterization 10/13/2019 onclusions: Moderate to severe, non-critical two vessel coronary artery disease with sequential 50% proximal/mid LAD stenoses as well as tandem 60-70% proximal RCA lesions.  There is also a 70% stenosis involving rPL1. Grossly normal left ventricular systolic function with moderately elevated filling pressure (LVEDP 25-30 mmHg). Tortuous right subclavian artery limiting catheter manipulation.  Consider use of a long radial sheath versus alternate access if catheterization is needed in the future.   Recommendations: Given recent severe anemia and improving symptoms with rising hemoglobin, as well as moderately elevated LVEDP, I favor optimization of medical therapy.  etc  Physical Exam  GEN: No acute distress.   Neck: No JVD Cardiac: RRR with rare superimposed ectopy, no murmurs, rubs, or gallops.  Respiratory: Clear to auscultation bilaterally. GI: Soft, nontender,  non-distended  MS: No edema  Assessment & Plan   73 year old with a history of CAD (50% proximal-mid LAD, 60 to 70% proximal RCA, 70% R PLV medically managed), iron  deficiency anemia, HTN, HLP, DM2, history of CVA (on chronic dual antiplatelet therapy), presenting with acute respiratory failure with hypoxia and acute worsening of anemia, requiring transfusion.    Had minor elevation in high-sensitivity troponin, but echocardiogram does not show any new regional  wall motion abnormalities, LVEF is preserved and ECG is nonischemic.  Echo did show evidence of elevated mean left atrial pressure.  He had very rapid improvement with diuretics.  Suspect acute on chronic diastolic heart failure, tipped over by worsening anemia.  He was not monitoring sodium intake or weighing himself.  Had a lengthy discussion regarding a sodium restricted diet, daily weight monitoring, signs and symptoms of heart failure exacerbation.  He was only taking 20 mg of furosemide  every other day, there is a lot of room to improve diuretic therapy.  He has well-controlled type 2 diabetes mellitus, nevertheless would benefit from treatment with SGLT2 inhibitors for both DM and HFpEF.  Adding an SGLT2 inhibitor will probably lead to reduced insulin  requirements.  Will discuss increased risk of UTI, groin/genital yeast infections and low risk of perineal bacterial infection/Fournier's gangrene.  Review insurance benefits to make sure the medication is affordable.  At home he was taking meloxicam 7.5 mg twice daily.  Discussed with patient and family that all NSAIDs increase the risk of heart failure exacerbation due to salt/volume retention as well as increase the risk of GI bleeding.  Recommend discontinue meloxicam and avoiding all NSAIDs.  He does not have a cardiac indication for dual antiplatelet therapy.  He has been on combination aspirin  plus clopidogrel due to his history of stroke.  He last had a TIA roughly 6 months ago.  Since the anemia is clearly persistent and causing serious complications I would recommend switching to clopidogrel 75 mg daily monotherapy.  May also want to get the opinion from the neurology/stroke team.    For questions or updates, please contact [facility] Please consult [email or web address] for contact info under         Signed, Jerel Balding, MD

## 2023-10-26 NOTE — Plan of Care (Signed)
  Problem: Coping: Goal: Ability to adjust to condition or change in health will improve Outcome: Progressing   Problem: Fluid Volume: Goal: Ability to maintain a balanced intake and output will improve Outcome: Progressing   Problem: Health Behavior/Discharge Planning: Goal: Ability to identify and utilize available resources and services will improve Outcome: Progressing Goal: Ability to manage health-related needs will improve Outcome: Progressing   Problem: Metabolic: Goal: Ability to maintain appropriate glucose levels will improve Outcome: Progressing   Problem: Nutritional: Goal: Maintenance of adequate nutrition will improve Outcome: Progressing Goal: Progress toward achieving an optimal weight will improve Outcome: Progressing   Problem: Skin Integrity: Goal: Risk for impaired skin integrity will decrease Outcome: Progressing   Problem: Tissue Perfusion: Goal: Adequacy of tissue perfusion will improve Outcome: Progressing   Problem: Education: Goal: Knowledge of General Education information will improve Description: Including pain rating scale, medication(s)/side effects and non-pharmacologic comfort measures Outcome: Progressing   Problem: Health Behavior/Discharge Planning: Goal: Ability to manage health-related needs will improve Outcome: Progressing   Problem: Clinical Measurements: Goal: Ability to maintain clinical measurements within normal limits will improve Outcome: Progressing Goal: Will remain free from infection Outcome: Progressing Goal: Diagnostic test results will improve Outcome: Progressing Goal: Respiratory complications will improve Outcome: Progressing Goal: Cardiovascular complication will be avoided Outcome: Progressing   Problem: Activity: Goal: Risk for activity intolerance will decrease Outcome: Progressing   Problem: Nutrition: Goal: Adequate nutrition will be maintained Outcome: Progressing   Problem: Coping: Goal:  Level of anxiety will decrease Outcome: Progressing   Problem: Elimination: Goal: Will not experience complications related to bowel motility Outcome: Progressing

## 2023-10-27 DIAGNOSIS — I5031 Acute diastolic (congestive) heart failure: Secondary | ICD-10-CM

## 2023-10-27 DIAGNOSIS — K5520 Angiodysplasia of colon without hemorrhage: Secondary | ICD-10-CM | POA: Diagnosis not present

## 2023-10-27 DIAGNOSIS — J9601 Acute respiratory failure with hypoxia: Secondary | ICD-10-CM | POA: Diagnosis not present

## 2023-10-27 DIAGNOSIS — D500 Iron deficiency anemia secondary to blood loss (chronic): Secondary | ICD-10-CM | POA: Diagnosis not present

## 2023-10-27 DIAGNOSIS — D649 Anemia, unspecified: Secondary | ICD-10-CM

## 2023-10-27 DIAGNOSIS — I5033 Acute on chronic diastolic (congestive) heart failure: Secondary | ICD-10-CM | POA: Diagnosis not present

## 2023-10-27 LAB — RENAL FUNCTION PANEL
Albumin: 3.5 g/dL (ref 3.5–5.0)
Anion gap: 12 (ref 5–15)
BUN: 24 mg/dL — ABNORMAL HIGH (ref 8–23)
CO2: 27 mmol/L (ref 22–32)
Calcium: 9.2 mg/dL (ref 8.9–10.3)
Chloride: 97 mmol/L — ABNORMAL LOW (ref 98–111)
Creatinine, Ser: 1.57 mg/dL — ABNORMAL HIGH (ref 0.61–1.24)
GFR, Estimated: 46 mL/min — ABNORMAL LOW (ref >60)
Glucose, Bld: 145 mg/dL — ABNORMAL HIGH (ref 70–99)
Phosphorus: 4.1 mg/dL (ref 2.5–4.6)
Potassium: 3.5 mmol/L (ref 3.5–5.1)
Sodium: 136 mmol/L (ref 135–145)

## 2023-10-27 LAB — CBC
HCT: 28.1 % — ABNORMAL LOW (ref 39.0–52.0)
Hemoglobin: 8.2 g/dL — ABNORMAL LOW (ref 13.0–17.0)
MCH: 24.6 pg — ABNORMAL LOW (ref 26.0–34.0)
MCHC: 29.2 g/dL — ABNORMAL LOW (ref 30.0–36.0)
MCV: 84.4 fL (ref 80.0–100.0)
Platelets: 389 K/uL (ref 150–400)
RBC: 3.33 MIL/uL — ABNORMAL LOW (ref 4.22–5.81)
RDW: 16.2 % — ABNORMAL HIGH (ref 11.5–15.5)
WBC: 9.3 K/uL (ref 4.0–10.5)
nRBC: 0 % (ref 0.0–0.2)

## 2023-10-27 LAB — GLUCOSE, CAPILLARY
Glucose-Capillary: 148 mg/dL — ABNORMAL HIGH (ref 70–99)
Glucose-Capillary: 177 mg/dL — ABNORMAL HIGH (ref 70–99)
Glucose-Capillary: 148 mg/dL — ABNORMAL HIGH (ref 70–99)
Glucose-Capillary: 137 mg/dL — ABNORMAL HIGH (ref 70–99)

## 2023-10-27 LAB — MAGNESIUM: Magnesium: 2 mg/dL (ref 1.7–2.4)

## 2023-10-27 MED ORDER — SODIUM CHLORIDE 0.9 % IV SOLN: INTRAVENOUS | Status: DC

## 2023-10-27 MED ORDER — POTASSIUM CHLORIDE CRYS ER 20 MEQ PO TBCR
40.0000 meq | EXTENDED_RELEASE_TABLET | Freq: Once | ORAL | Status: AC
  Administered 2023-10-27: 40 meq via ORAL
  Filled 2023-10-27: qty 2

## 2023-10-27 MED ORDER — NA SULFATE-K SULFATE-MG SULF 17.5-3.13-1.6 GM/177ML PO SOLN: 0.5000 | Freq: Once | ORAL | Status: DC

## 2023-10-27 MED ORDER — NA SULFATE-K SULFATE-MG SULF 17.5-3.13-1.6 GM/177ML PO SOLN
0.5000 | Freq: Once | ORAL | Status: AC
  Administered 2023-10-27: 177 mL via ORAL
  Filled 2023-10-27 (×2): qty 1

## 2023-10-27 NOTE — Progress Notes (Signed)
 Progress Note   Assessment    73 year old male with a history of gastric and colonic angioectasias, acute on chronic iron  deficiency anemia, heme positive stool in the setting of aspirin  Plavix and meloxicam admitted with melena and symptomatic anemia, demand ischemia, acute on chronic diastolic CHF subsequently improved  Principal Problem:   Acute hypoxemic respiratory failure (HCC) Active Problems:   Essential hypertension   Hyperlipidemia   Gout   BPH (benign prostatic hyperplasia)   DM (diabetes mellitus) with complications (HCC)   Symptomatic anemia   Dyslipidemia   Coronary artery disease involving native coronary artery of native heart without angina pectoris   Hypertension, benign   GI bleed   Demand ischemia (HCC)   History of CVA (cerebrovascular accident)   Obesity (BMI 30-39.9)   Acute diastolic heart failure (HCC)   Angiodysplasia of intestine   Heme positive stool   Recommendations   1.  Iron  deficiency anemia/heme positive stool/history of stomach and colonic angioectasias  -- EGD and colonoscopy tomorrow with monitored anesthesia care; bowel prep discussed and ordered. -- He has received IV iron  while here and will need to continue this as an outpatient to complete the course -- Hemoglobin now stable, check hemoglobin daily -- Continue to hold DOAC for now -- Procedures deemed appropriate while here per cardiology -- Twice daily PPI  2.  Acute on chronic diastolic CHF with minor troponin elevation --improving, cardiology following.  Receiving additional diuretics per cardiology.  Additional recommendations after endoscopic evaluation tomorrow  35 minutes total spent today including patient facing time, coordination of care, reviewing medical history/procedures/pertinent radiology studies, and documentation of the encounter.   Chief Complaint   Patient with moderate back pain now resolved No chest pain or shortness of breath off oxygen  No  abdominal pain On clear liquids and recalls previous discussion about upper endoscopy and colonoscopy tomorrow  Vital signs in last 24 hours: Temp:  [97.3 F (36.3 C)-98 F (36.7 C)] 98 F (36.7 C) (09/23 1605) Pulse Rate:  [63-75] 72 (09/23 1605) Resp:  [18-20] 18 (09/23 1605) BP: (99-113)/(36-71) 108/71 (09/23 1605) SpO2:  [90 %-94 %] 94 % (09/23 1605) Weight:  [120.5 kg] 120.5 kg (09/23 1041) Last BM Date : 10/27/23 Gen: awake, alert, NAD HEENT: anicteric  CV: RRR, no mrg Pulm: CTA b/l Abd: soft, obese, NT/ND, +BS throughout Ext: no c/c/e Neuro: nonfocal    Intake/Output from previous day: 09/22 0701 - 09/23 0700 In: 960 [P.O.:960] Out: 1325 [Urine:1325] Intake/Output this shift: Total I/O In: 120 [P.O.:120] Out: 575 [Urine:575]  Lab Results: Recent Labs    10/24/23 2213 10/25/23 0431 10/25/23 0539 10/26/23 0402 10/27/23 0316  WBC 10.9*  --   --  9.4 9.3  HGB 7.0*   < > 8.8* 8.1* 8.2*  HCT 24.9*   < > 26.0* 27.5* 28.1*  PLT 453*  --   --  414* 389   < > = values in this interval not displayed.   BMET Recent Labs    10/25/23 0713 10/26/23 0402 10/27/23 0316  NA 137 137 136  K 3.6 3.1* 3.5  CL 99 98 97*  CO2 22 28 27   GLUCOSE 120* 141* 145*  BUN 14 15 24*  CREATININE 1.07 1.34* 1.57*  CALCIUM  9.4 9.4 9.2   LFT Recent Labs    10/27/23 0316  ALBUMIN 3.5     LOS: 2 days   Gordy CHRISTELLA Starch, MD 10/27/2023, 4:13 PM See TRACEY, [facility], to contact our on call  provider

## 2023-10-27 NOTE — Plan of Care (Signed)
  Problem: Coping: Goal: Ability to adjust to condition or change in health will improve Outcome: Progressing   Problem: Fluid Volume: Goal: Ability to maintain a balanced intake and output will improve Outcome: Progressing   Problem: Health Behavior/Discharge Planning: Goal: Ability to manage health-related needs will improve Outcome: Progressing   Problem: Metabolic: Goal: Ability to maintain appropriate glucose levels will improve Outcome: Progressing   Problem: Nutritional: Goal: Maintenance of adequate nutrition will improve Outcome: Progressing Goal: Progress toward achieving an optimal weight will improve Outcome: Progressing   Problem: Skin Integrity: Goal: Risk for impaired skin integrity will decrease Outcome: Progressing   Problem: Tissue Perfusion: Goal: Adequacy of tissue perfusion will improve Outcome: Progressing   Problem: Education: Goal: Knowledge of General Education information will improve Description: Including pain rating scale, medication(s)/side effects and non-pharmacologic comfort measures Outcome: Progressing   Problem: Health Behavior/Discharge Planning: Goal: Ability to manage health-related needs will improve Outcome: Progressing   Problem: Clinical Measurements: Goal: Ability to maintain clinical measurements within normal limits will improve Outcome: Progressing Goal: Will remain free from infection Outcome: Progressing Goal: Diagnostic test results will improve Outcome: Progressing Goal: Respiratory complications will improve Outcome: Progressing Goal: Cardiovascular complication will be avoided Outcome: Progressing   Problem: Activity: Goal: Risk for activity intolerance will decrease Outcome: Progressing   Problem: Nutrition: Goal: Adequate nutrition will be maintained Outcome: Progressing   Problem: Coping: Goal: Level of anxiety will decrease Outcome: Progressing   Problem: Elimination: Goal: Will not experience  complications related to bowel motility Outcome: Progressing Goal: Will not experience complications related to urinary retention Outcome: Progressing   Problem: Pain Managment: Goal: General experience of comfort will improve and/or be controlled Outcome: Progressing   Problem: Safety: Goal: Ability to remain free from injury will improve Outcome: Progressing   Problem: Skin Integrity: Goal: Risk for impaired skin integrity will decrease Outcome: Progressing

## 2023-10-27 NOTE — Progress Notes (Signed)
 PROGRESS NOTE  [name] Williams FMW:[id number] DOB: February 12, 1950   PCP: Keren Vicenta BRAVO, MD  Patient is from: Home.  He is widowed.  Lives alone.  Has 2 sons and multiple siblings who lives in [place]. Uses cane at baseline.  DOA: 10/24/2023 LOS: 2  Chief complaints Chief Complaint  Patient presents with   choking sensation     Brief Narrative / Interim history: 73 year old M with PMH of HFpEF, CVA on Plavix and aspirin , CAD, DM-2, HTN, BPH, gout, HLD, obesity, anxiety and depression presenting with acute shortness of breath for 1 day with associated dry cough.  Also intermittent hematochezia on a stool for a while.  Patient is a very poor historian.  Denies over-the-counter NSAIDs.  Reportedly last took his medications on Saturday.  Plan daughter help with medications.    In ED, desaturated to 84% and required up to 3 L by HFNC.  Hgb 7.0.  Hemoccult positive. Admitted with working diagnosis of acute respiratory failure with hypoxia due to CHF exacerbation and acute blood loss anemia.  Started on IV Lasix .  1 unit of blood ordered.  PCCM and GI consulted.  Cardiology consulted later in the day.  Lasix  increased.  TTE with LVEF of 50 to 55% and G2 DD.  Patient was liberated off oxygen .  Plan for EGD/colonoscopy on 9/24 before resuming Plavix.   Subjective: Seen and examined earlier this morning.  Per RN, patient continues to have confusion at night.  No complaint this morning.  He denies chest pain, dyspnea, nausea, vomiting or abdominal pain.  He is oriented x 4.  Aware of GI plan tomorrow.  Objective: Vitals:   10/27/23 0313 10/27/23 0737 10/27/23 1041 10/27/23 1054  BP: (!) 113/59 (!) 110/51  (!) 104/53  Pulse:  71  75  Resp: 20 18  18   Temp: (!) 97.4 F (36.3 C) 97.8 F (36.6 C)  (!) 97.3 F (36.3 C)  TempSrc: Oral Oral  Oral  SpO2:    94%  Weight:   120.5 kg   Height:        Examination:  GENERAL: No apparent distress.  Nontoxic. HEENT: MMM.  Vision and hearing  grossly intact.  NECK: Supple.  No apparent JVD.  RESP:  No IWOB.  Fair aeration bilaterally. CVS:  RRR. Heart sounds normal.  ABD/GI/GU: BS+. Abd soft, NTND.  MSK/EXT:  Moves extremities. No apparent deformity. No edema.  SKIN: no apparent skin lesion or wound NEURO: AA.  Oriented x 4.  Slow speech.  No apparent focal neuro deficit. PSYCH: Calm. Normal affect.   Consultants:  Critical. Cardiology Gastroenterology  Procedures: None  Microbiology summarized: None  Assessment and plan: Acute hypoxemic respiratory failure: Desaturated to 84% and required up to 3 L by HFNC on admission.  Likely due to CHF exacerbation.  - Manage CHF as below  Acute on chronic diastolic CHF: Suspect noncompliance with diuretics.  Did not take his meds for 2 to 3 days.  Also suspect dietary noncompliance.  CXR and CTA chest suggested interstitial edema and small layering pleural effusions.  BNP 390.  TTE with LVEF of 50 to 55% and G2 DD.  Diuresed with IV Lasix .  In and out incomplete but net -3.4 L.  Respiratory failure resolved.  Cr trended up. -Appreciate help by cardiology-discontinue Lasix  and started Farxiga . -Strict intake and output, daily weight, renal functions and electrolytes -Decrease amlodipine  with the hope to wean off completely    Acute on chronic blood loss iron  deficiency anemia  in the setting of lower GIB:  Significant drop in Hgb in the last 4 months. Chronically on Plavix and aspirin  for CVA.  Reportedly last dose is on 9/20.  Anemia panel suggested severe iron  deficiency.  Transfused 1 unit with appropriate response. Recent Labs    02/09/23 1025 06/15/23 1441 10/24/23 2213 10/25/23 0431 10/25/23 0539 10/26/23 0402 10/27/23 0316  HGB 12.0* 12.4* 7.0* 8.2* 8.8* 8.1* 8.2*  - Hold Plavix and aspirin .  I do not think he should be on both of them at the same time  - IV Venofer  300 mg x 1 - Continue Protonix  - Appreciate help by GI-EGD and colonoscopy on 9/24 - Continue clear  liquid diet  Elevated troponin/history of CAD: Troponin is slowly trended from 52-1000.  Concerning for non-STEMI but patient without chest pain.  Seems to have some ST depression in lateral leads without dynamic change.  Unclear if this is new.  No RWMA on TTE.  LHC in 2021 with moderate to severe proximal/mid LAD stenosis as well as tandem 60 to 70% proximal RCA lesion.  -Defer to cardiology.  Not able to anticoagulate or use antiplatelets due to GI bleed -Continue Lipitor .   History of CVA: Does not seem to have residual deficit.  On aspirin  and Plavix chronically. - Continue holding aspirin , Plavix - Continue home Lipitor   IDDM-2 with hyperglycemia: A1c 6.5%. Recent Labs  Lab 10/26/23 1143 10/26/23 1643 10/26/23 2119 10/27/23 0554 10/27/23 1057  GLUCAP 240* 229* 147* 148* 177*  - Continue SSI-sensitive  AKI: Baseline Cr 1.2.  Due to diuretics?  Soft BP. Recent Labs    06/15/23 1441 10/24/23 2213 10/25/23 0713 10/26/23 0402 10/27/23 0316  BUN 32* 17 14 15  24*  CREATININE 1.25* 1.27* 1.07 1.34* 1.57*  - Diuretics discontinued - Discontinue amlodipine  - Recheck in the morning   Hypertension: Soft BP. -Discontinue amlodipine  -Diuretics discontinue -Continue Cozaar  and Imdur    BPH without LUTS -Continue home Flomax   Mood disorder/insomnia - Continue home Celexa  and melatonin  Hypokalemia -Monitor replenish K and Mg as appropriate  Delirium: Currently awake and alert and oriented x 4 but not a great historian.  He was confused and pulled IV line last night. No formal diagnosis of cognitive impairment -Delirium precaution   Morbid obesity: Elevated BMI with diabetes and other comorbidities as above Body mass index is 37.05 kg/m.           DVT prophylaxis:  SCDs Start: 10/25/23 0846  Code Status: Full code Family Communication: None at bedside Level of care: Progressive Status is: Inpatient Remains inpatient appropriate because: Acute CHF, GIB,  possible non-STEMI   Final disposition: Likely home once medically stable   55 minutes with more than 50% spent in reviewing records, counseling patient/family and coordinating care.   Sch Meds:  Scheduled Meds:  amLODipine   5 mg Oral Daily   atorvastatin   80 mg Oral QHS   citalopram   20 mg Oral Daily   dapagliflozin  propanediol  10 mg Oral Daily   hydrocerin   Topical BID   insulin  aspart  0-15 Units Subcutaneous TID WC   insulin  aspart  0-5 Units Subcutaneous QHS   isosorbide  mononitrate  30 mg Oral Daily   loratadine   10 mg Oral Daily   losartan   100 mg Oral QHS   pantoprazole  (PROTONIX ) IV  40 mg Intravenous Q12H   sodium chloride  flush  3 mL Intravenous Q12H   tamsulosin   0.4 mg Oral Daily   Continuous Infusions:   PRN  Meds:.acetaminophen  **OR** acetaminophen , HYDROcodone -acetaminophen , melatonin, ondansetron  **OR** ondansetron  (ZOFRAN ) IV, sodium chloride  flush  Antimicrobials: Anti-infectives (From admission, onward)    None        I have personally reviewed the following labs and images: CBC: Recent Labs  Lab 10/24/23 2213 10/25/23 0431 10/25/23 0539 10/26/23 0402 10/27/23 0316  WBC 10.9*  --   --  9.4 9.3  HGB 7.0* 8.2* 8.8* 8.1* 8.2*  HCT 24.9* 24.0* 26.0* 27.5* 28.1*  MCV 87.1  --   --  83.3 84.4  PLT 453*  --   --  414* 389   BMP &GFR Recent Labs  Lab 10/24/23 2213 10/25/23 0431 10/25/23 0539 10/25/23 0713 10/26/23 0402 10/27/23 0316  NA 136 141 139 137 137 136  K 4.3 4.2 4.0 3.6 3.1* 3.5  CL 104  --   --  99 98 97*  CO2 21*  --   --  22 28 27   GLUCOSE 202*  --   --  120* 141* 145*  BUN 17  --   --  14 15 24*  CREATININE 1.27*  --   --  1.07 1.34* 1.57*  CALCIUM  9.0  --   --  9.4 9.4 9.2  MG  --   --   --  1.7 1.7 2.0  PHOS  --   --   --  3.5  --  4.1   Estimated Creatinine Clearance: 54.5 mL/min (A) (by C-G formula based on SCr of 1.57 mg/dL (H)). Liver & Pancreas: Recent Labs  Lab 10/25/23 0713 10/27/23 0316  ALBUMIN 3.9  3.5   No results for input(s): LIPASE, AMYLASE in the last 168 hours. No results for input(s): AMMONIA in the last 168 hours. Diabetic: Recent Labs    10/25/23 0858  HGBA1C 6.5*   Recent Labs  Lab 10/26/23 1143 10/26/23 1643 10/26/23 2119 10/27/23 0554 10/27/23 1057  GLUCAP 240* 229* 147* 148* 177*   Cardiac Enzymes: No results for input(s): CKTOTAL, CKMB, CKMBINDEX, TROPONINI in the last 168 hours. No results for input(s): PROBNP in the last 8760 hours. Coagulation Profile: No results for input(s): INR, PROTIME in the last 168 hours. Thyroid  Function Tests: No results for input(s): TSH, T4TOTAL, FREET4, T3FREE, THYROIDAB in the last 72 hours. Lipid Profile: No results for input(s): CHOL, HDL, LDLCALC, TRIG, CHOLHDL, LDLDIRECT in the last 72 hours. Anemia Panel: Recent Labs    10/25/23 1437  FERRITIN 8*  TIBC 643*  IRON  218*   Urine analysis:    Component Value Date/Time   COLORURINE YELLOW 05/18/2019 1533   APPEARANCEUR CLEAR 05/18/2019 1533   LABSPEC 1.015 05/18/2019 1533   PHURINE 5.0 05/18/2019 1533   GLUCOSEU NEGATIVE 05/18/2019 1533   HGBUR NEGATIVE 05/18/2019 1533   BILIRUBINUR NEGATIVE 05/18/2019 1533   KETONESUR NEGATIVE 05/18/2019 1533   PROTEINUR NEGATIVE 05/18/2019 1533   NITRITE NEGATIVE 05/18/2019 1533   LEUKOCYTESUR NEGATIVE 05/18/2019 1533   Sepsis Labs: Invalid input(s): PROCALCITONIN, LACTICIDVEN  Microbiology: No results found for this or any previous visit (from the past 240 hours).  Radiology Studies: No results found.     Chavonne Sforza T. Bronwen Pendergraft [facility]  If 7PM-7AM, please contact night-coverage [email or web address] 10/27/2023, 3:30 PM

## 2023-10-27 NOTE — Progress Notes (Signed)
 Printed colonoscopy pt education provided and reviewed with pt.  Consent for procedure signed and placed in chart.

## 2023-10-27 NOTE — Progress Notes (Signed)
 Mobility Specialist Progress Note:   10/27/23 1030  Mobility  Activity Ambulated with assistance  Level of Assistance Contact guard assist, steadying assist  Assistive Device Front wheel walker  Distance Ambulated (ft) 150 ft  Activity Response Tolerated well  Mobility Referral Yes  Mobility visit 1 Mobility  Mobility Specialist Start Time (ACUTE ONLY) 1030  Mobility Specialist Stop Time (ACUTE ONLY) 1039  Mobility Specialist Time Calculation (min) (ACUTE ONLY) 9 min   Pt received in bed, agreeable to mobility session. Ambulated in hallway with RW and CGA for safety. Tolerated well, c/o leg fatigue. Pt sitting up in chair with all needs met.   Marilin Kofman Mobility Specialist Please contact via Special educational needs teacher or  Rehab office at [phone]

## 2023-10-27 NOTE — Progress Notes (Signed)
 Notified on call triad that this patient does not want to drink the suprep for his EGD/colonoscopy.    Informed this RN that GI needed to be called. Paged on call for [facility] to let them know.

## 2023-10-27 NOTE — Progress Notes (Addendum)
 Progress Note  Patient Name: [name] Williams Date of Encounter: 10/27/2023 [facility] Cardiologist: Kardie Tobb, DO   Interval Summary   Patient is off O2, sitting upright comfortably.  He is willing to try to walk today.  He feels his breathing is pretty good today.  Vital Signs Vitals:   10/26/23 1940 10/26/23 2319 10/27/23 0313 10/27/23 0737  BP: 101/62 (!) 99/36 (!) 113/59 (!) 110/51  Pulse: 69 63  71  Resp: 20 18 20 18   Temp: 97.6 F (36.4 C) (!) 97.3 F (36.3 C) (!) 97.4 F (36.3 C) 97.8 F (36.6 C)  TempSrc: Oral Oral Oral Oral  SpO2: 94% 90%    Weight:      Height:        Intake/Output Summary (Last 24 hours) at 10/27/2023 1016 Last data filed at 10/27/2023 0916 Gross per 24 hour  Intake 480 ml  Output 1375 ml  Net -895 ml      10/24/2023    9:57 PM 06/30/2023    8:30 AM 06/22/2023    8:08 AM  Last 3 Weights  Weight (lbs) 282 lb 3 oz 283 lb 1.9 oz 284 lb 12 oz  Weight (kg) 128 kg 128.422 kg 129.162 kg        Latest Ref Rng & Units 10/27/2023    3:16 AM 10/26/2023    4:02 AM 10/25/2023    7:13 AM  BMP  Glucose 70 - 99 mg/dL 854  858  879   BUN 8 - 23 mg/dL 24  15  14    Creatinine 0.61 - 1.24 mg/dL 8.42  8.65  8.92   Sodium 135 - 145 mmol/L 136  137  137   Potassium 3.5 - 5.1 mmol/L 3.5  3.1  3.6   Chloride 98 - 111 mmol/L 97  98  99   CO2 22 - 32 mmol/L 27  28  22    Calcium  8.9 - 10.3 mg/dL 9.2  9.4  9.4      Telemetry/ECG  ECG none today  telemetry shows sinus rhythm, sinus bradycardia with PACs and PVCs- Personally Reviewed  Echocardiogram 10/25/2023 1. Left ventricular ejection fraction, by estimation, is 50 to 55%. Left  ventricular ejection fraction by 2D MOD biplane is 43.4 %. The left  ventricle has low normal function. The left ventricle has no regional wall motion abnormalities. The left ventricular internal cavity size was severely dilated. There is mild concentric left ventricular hypertrophy. Left ventricular diastolic  parameters are consistent with Grade II diastolic dysfunction (pseudonormalization). Elevated left atrial pressure.   2. Right ventricular systolic function is normal. The right ventricular  size is normal. Tricuspid regurgitation signal is inadequate for assessing PA pressure.   3. Left atrial size was mildly dilated.   4. The mitral valve was not well visualized. Trivial mitral valve  regurgitation.   5. The aortic valve is tricuspid. There is mild calcification of the  aortic valve. Aortic valve regurgitation is mild. Aortic valve  sclerosis/calcification is present, without any evidence of aortic  stenosis.   6. Aortic dilatation noted. There is mild dilatation of the aortic root,  measuring 45 mm. There is mild dilatation of the ascending aorta,  measuring 41 mm.   Comparison(s): There is now evidence of elevated mean left atrial  pressure.    Left heart catheterization 10/13/2019 Conclusions: Moderate to severe, non-critical two vessel coronary artery disease with sequential 50% proximal/mid LAD stenoses as well as tandem 60-70% proximal RCA lesions.  There  is also a 70% stenosis involving rPL1. Grossly normal left ventricular systolic function with moderately elevated filling pressure (LVEDP 25-30 mmHg). Tortuous right subclavian artery limiting catheter manipulation.  Consider use of a long radial sheath versus alternate access if catheterization is needed in the future.  Recommendations: Given recent severe anemia and improving symptoms with rising hemoglobin, as well as moderately elevated LVEDP, I favor optimization of medical therapy.  etc  Physical Exam  General: Well developed, well nourished, male in no acute distress Head: Eyes PERRLA, Head normocephalic and atraumatic Lungs: clear bilaterally to auscultation. Heart: HRRR S1 S2, without rub or gallop. No murmur. 4/4 extremity pulses are 2+ & equal. No JVD. Abdomen: Bowel sounds are present, abdomen soft and non-tender  without masses or  hernias noted. Msk: Normal strength and tone for age. Extremities: No clubbing, cyanosis or edema.    Skin:  No rashes or lesions noted. Neuro: Alert and oriented X 3 but memory is poor Psych:  Good affect, responds appropriately  Assessment & Plan   73 year old with a history of CAD (50% proximal-mid LAD, 60 to 70% proximal RCA, 70% R PLV medically managed), iron  deficiency anemia, HTN, HLP, DM2, history of CVA (on chronic dual antiplatelet therapy), presenting with acute respiratory failure with hypoxia and acute worsening of anemia, requiring transfusion.    minor elevation in high-sensitivity troponin - echocardiogram does not show any new regional wall motion abnormalities, LVEF is preserved and ECG is nonischemic.   -Echo did show evidence of elevated mean left atrial pressure.  He had very rapid improvement with diuretics.   - Suspect acute on chronic diastolic heart failure, tipped over by worsening anemia. - His daughter helps in his care, perhaps that she can help reduce his sodium intake and water intake - Only on Lasix  20 mg QOD, likely needs higher dose  2. well-controlled type 2 diabetes mellitus - nevertheless would benefit from treatment with SGLT2 inhibitors for both DM and HFpEF.  - Adding an SGLT2 inhibitor will probably lead to reduced insulin  requirements.   - Will discuss increased risk of UTI, groin/genital yeast infections and low risk of perineal bacterial infection/Fournier's gangrene.   - Review insurance benefits to make sure the medication is affordable.  3.  Acute on chronic diastolic CHF - Weight is pending, nursing staff says they will get - I's/O net -3.8 L since admission - He is on Lasix  80 mg IV twice daily, up from 40 mg IV twice daily. - His BUN and creatinine are trending up, will hold the Lasix  - Home Lasix  was 20 mg every other day, consider restarting p.o. Lasix  at 40 mg a day on 9/24  3.  IDA - GI is following they feel  that with known prior gastric and colonic angioectasias, he needs upper endoscopy and colonoscopy prior to Plavix reinitiation and prior to discharge.  -He has known gastric and colonic angioectasias - He is to get bowel prep this evening and possibly have procedures on Wednesday  4.  Meds: - At home he was taking meloxicam 7.5 mg twice daily.  Discussed with patient and family that all NSAIDs increase the risk of heart failure exacerbation due to salt/volume retention as well as increase the risk of GI bleeding.  - Both Cardiology and GI recommend he discontinue meloxicam and avoid all NSAIDs.  5.  Hx DAPT  - he does not have a cardiac indication for dual antiplatelet therapy.  He has been on combination aspirin  plus clopidogrel due to  his history of stroke.   - last had TIA was roughly 6 months ago.  - Since the anemia is clearly persistent and causing serious complications, cardiology recommends switching to clopidogrel 75 mg daily monotherapy.  -  May also want to get the opinion from the Neurology/stroke team.    For questions or updates, please contact [facility] Please consult [email or web address] for contact info under         Signed, Shona Shad, PA-C    I have seen and examined the patient along with Shona Shad, PA-C .  I have reviewed the chart, notes and new data.  I agree with PA/NP's note.  Key new complaints: He feels much better from a cardiac point of view.  Does not have any dyspnea lying supine in bed or when walking in the hallway with a walker.  No angina. Key examination changes: Pale, no evidence of jugular venous distention, no peripheral edema, clear lungs.  RRR. Key new findings / data: Remains in normal sinus rhythm.  Hemoglobin stable.  PLAN: Plan for EGD and colonoscopy tomorrow. Recommend starting SGLT2 inhibitor after his procedures tomorrow.  Resume clopidogrel if there is no evidence of active bleeding.  Leonna Schlee, MD, FACC [facility] [phone] 10/27/2023, 11:37 AM

## 2023-10-28 DIAGNOSIS — J9601 Acute respiratory failure with hypoxia: Secondary | ICD-10-CM | POA: Diagnosis not present

## 2023-10-28 LAB — GLUCOSE, CAPILLARY
Glucose-Capillary: 131 mg/dL — ABNORMAL HIGH (ref 70–99)
Glucose-Capillary: 218 mg/dL — ABNORMAL HIGH (ref 70–99)
Glucose-Capillary: 115 mg/dL — ABNORMAL HIGH (ref 70–99)
Glucose-Capillary: 144 mg/dL — ABNORMAL HIGH (ref 70–99)

## 2023-10-28 LAB — RENAL FUNCTION PANEL
Albumin: 3.4 g/dL — ABNORMAL LOW (ref 3.5–5.0)
Anion gap: 12 (ref 5–15)
BUN: 23 mg/dL (ref 8–23)
CO2: 25 mmol/L (ref 22–32)
Calcium: 9.2 mg/dL (ref 8.9–10.3)
Chloride: 100 mmol/L (ref 98–111)
Creatinine, Ser: 1.27 mg/dL — ABNORMAL HIGH (ref 0.61–1.24)
GFR, Estimated: 59 mL/min — ABNORMAL LOW (ref >60)
Glucose, Bld: 128 mg/dL — ABNORMAL HIGH (ref 70–99)
Phosphorus: 4.1 mg/dL (ref 2.5–4.6)
Potassium: 3.5 mmol/L (ref 3.5–5.1)
Sodium: 137 mmol/L (ref 135–145)

## 2023-10-28 LAB — CBC
HCT: 27.6 % — ABNORMAL LOW (ref 39.0–52.0)
Hemoglobin: 8.1 g/dL — ABNORMAL LOW (ref 13.0–17.0)
MCH: 24.8 pg — ABNORMAL LOW (ref 26.0–34.0)
MCHC: 29.3 g/dL — ABNORMAL LOW (ref 30.0–36.0)
MCV: 84.4 fL (ref 80.0–100.0)
Platelets: 354 K/uL (ref 150–400)
RBC: 3.27 MIL/uL — ABNORMAL LOW (ref 4.22–5.81)
RDW: 16.7 % — ABNORMAL HIGH (ref 11.5–15.5)
WBC: 9.1 K/uL (ref 4.0–10.5)
nRBC: 0 % (ref 0.0–0.2)

## 2023-10-28 LAB — MAGNESIUM: Magnesium: 2.2 mg/dL (ref 1.7–2.4)

## 2023-10-28 MED ORDER — ONDANSETRON HCL 4 MG/2ML IJ SOLN
4.0000 mg | Freq: Once | INTRAMUSCULAR | Status: AC
  Administered 2023-10-28: 4 mg via INTRAVENOUS
  Filled 2023-10-28: qty 2

## 2023-10-28 MED ORDER — SODIUM CHLORIDE 0.9 % IV SOLN: INTRAVENOUS | Status: AC

## 2023-10-28 MED ORDER — NA SULFATE-K SULFATE-MG SULF 17.5-3.13-1.6 GM/177ML PO SOLN
0.5000 | Freq: Once | ORAL | Status: AC
  Administered 2023-10-28: 177 mL via ORAL
  Filled 2023-10-28: qty 1

## 2023-10-28 MED ORDER — ONDANSETRON HCL 4 MG/2ML IJ SOLN
4.0000 mg | Freq: Once | INTRAMUSCULAR | Status: AC
Start: 2023-10-28 — End: 2023-10-28
  Administered 2023-10-28: 4 mg via INTRAVENOUS
  Filled 2023-10-28: qty 2

## 2023-10-28 MED ORDER — NAPHAZOLINE-GLYCERIN 0.012-0.25 % OP SOLN
1.0000 [drp] | Freq: Four times a day (QID) | OPHTHALMIC | Status: DC | PRN
Start: 2023-10-28 — End: 2023-10-29
  Filled 2023-10-28: qty 15

## 2023-10-28 NOTE — Plan of Care (Signed)

## 2023-10-28 NOTE — Plan of Care (Signed)
  Problem: Coping: Goal: Ability to adjust to condition or change in health will improve Outcome: Not Progressing

## 2023-10-28 NOTE — Progress Notes (Signed)
 Mobility Specialist Progress Note:    10/28/23 1425  Mobility  Activity Ambulated with assistance (In hallway)  Level of Assistance Contact guard assist, steadying assist  Assistive Device Front wheel walker  Distance Ambulated (ft) 300 ft  Activity Response Tolerated well  Mobility Referral Yes  Mobility visit 1 Mobility  Mobility Specialist Start Time (ACUTE ONLY) 1214  Mobility Specialist Stop Time (ACUTE ONLY) 1225  Mobility Specialist Time Calculation (min) (ACUTE ONLY) 11 min   Received pt in chair and agreeable to mobility. Pt required MinG d/t safety. No c/o. Returned to room without fault. Left pt in chair with alarm on. Personal belongings and call light within reach. All needs met.  Lavanda Pollack Mobility Specialist  Please contact via [employer] or  Rehab Office [phone]

## 2023-10-28 NOTE — Progress Notes (Signed)
 [name] Williams  FMW:[id number] DOB: [birth year] DOA: 10/24/2023 PCP: Keren Vicenta BRAVO, MD    Brief Narrative:  73 year old with a history of chronic diastolic CHF, CVA on Plavix and aspirin , CAD, DM2, HTN, BPH, gout, HLD, obesity, and anxiety/depression who presented to the ER 9/20 with 1 day of acutely worsening shortness of breath associated with a dry cough and also noting intermittent hematochezia for an undisclosed subacute timeframe.  In the ER the patient desaturated to 84% on room air and required 3 L nasal cannula support.  Hemoglobin was found to be 7.0 with Hemoccult positive.  The patient was admitted to the acute units with acute hypoxic respiratory failure felt to be related to a CHF exacerbation as well as acute blood loss anemia.  Goals of Care:   Code Status: Full Code   DVT prophylaxis: SCDs Start: 10/25/23 0846   Interim Hx: Patient was unable to complete his bowel prep last night due to fear of vomiting and therefore planned endoscopy for today was canceled.  Afebrile.  Vital signs stable.  Sitting up in a bedside chair at the time of visit with no new complaints.  Denies chest pain or shortness of breath.  Denies abdominal pain.  Assessment & Plan:  Acute hypoxic respiratory failure Desaturated to 84% at time of presentation -felt to be due to pulmonary edema/CHF exacerbation -resolved with diuresis  Acute exacerbation of chronic diastolic CHF Felt to be due to noncompliance with diuretics as well as dietary noncompliance -TTE noted EF 50-55% and grade 2 DD -has been diuresed with IV Lasix  -respiratory failure has resolved -Lasix  now discontinued and Farxiga  initiated -cardiology has evaluated  Acute on chronic blood loss anemia -iron  deficiency Aspirin  and Plavix on hold -dosed with IV Venofer  300 mg x 1  Lower GI bleeding - hx of gastric and colonic angioectasias GI following -for endoscopic evaluation tomorrow  Elevated troponin -history of CAD No chest pain -no  WMA on TTE -LHC 2021 with moderate to severe proximal/mid LAD stenosis as well as tandem 60-70% proximal LCA lesion -has been evaluated by cardiology   History of CVA No residual deficit -previously on aspirin  and Plavix chronically -continue Lipitor  -DAPT on hold at present due to GI bleed  DM2  CBG presently controlled  AKI Baseline creatinine normal at 1.2 -creatinine increased with use of diuretic -diuretic now discontinued with subsequent stabilization/recovery of renal function  History of HTN Blood pressure was transiently borderline low -amlodipine  and diuretic discontinued with recovery of blood pressure  BPH without LUTS Continue Flomax   Anxiety/depression Continue usual Celexa   Delirium Experiencing nighttime delirium consistent with sundowning  Morbid obesity - Body mass index is 37.05 kg/m.   Family Communication: No family present at time of exam Disposition: Will depend upon results of endoscopy 9/25   Objective: Blood pressure 137/67, pulse 77, temperature 97.8 F (36.6 C), temperature source Oral, resp. rate 15, height 5' 11 (1.803 m), weight 120.5 kg, SpO2 95%.  Intake/Output Summary (Last 24 hours) at 10/28/2023 0956 Last data filed at 10/28/2023 0800 Gross per 24 hour  Intake 303.95 ml  Output 1650 ml  Net -1346.05 ml   Filed Weights   10/24/23 2157 10/27/23 1041  Weight: 128 kg 120.5 kg    Examination: General: No acute respiratory distress Lungs: Clear to auscultation bilaterally without wheezes or crackles Cardiovascular: Regular rate and rhythm without murmur gallop or rub normal S1 and S2 Abdomen: Nontender, nondistended, soft, bowel sounds positive, no rebound, no ascites, no appreciable  mass Extremities: No significant cyanosis, clubbing, or edema bilateral lower extremities  CBC: Recent Labs  Lab 10/26/23 0402 10/27/23 0316 10/28/23 0249  WBC 9.4 9.3 9.1  HGB 8.1* 8.2* 8.1*  HCT 27.5* 28.1* 27.6*  MCV 83.3 84.4 84.4  PLT 414*  389 354   Basic Metabolic Panel: Recent Labs  Lab 10/25/23 0713 10/26/23 0402 10/27/23 0316 10/28/23 0249  NA 137 137 136 137  K 3.6 3.1* 3.5 3.5  CL 99 98 97* 100  CO2 22 28 27 25   GLUCOSE 120* 141* 145* 128*  BUN 14 15 24* 23  CREATININE 1.07 1.34* 1.57* 1.27*  CALCIUM  9.4 9.4 9.2 9.2  MG 1.7 1.7 2.0 2.2  PHOS 3.5  --  4.1 4.1   GFR: Estimated Creatinine Clearance: 67.4 mL/min (A) (by C-G formula based on SCr of 1.27 mg/dL (H)).   Scheduled Meds:  atorvastatin   80 mg Oral QHS   citalopram   20 mg Oral Daily   dapagliflozin  propanediol  10 mg Oral Daily   hydrocerin   Topical BID   insulin  aspart  0-15 Units Subcutaneous TID WC   insulin  aspart  0-5 Units Subcutaneous QHS   isosorbide  mononitrate  30 mg Oral Daily   loratadine   10 mg Oral Daily   losartan   100 mg Oral QHS   Na Sulfate-K Sulfate-Mg Sulfate concentrate  0.5 kit Oral Once   Followed by   Na Sulfate-K Sulfate-Mg Sulfate concentrate  0.5 kit Oral Once   ondansetron  (ZOFRAN ) IV  4 mg Intravenous Once   ondansetron  (ZOFRAN ) IV  4 mg Intravenous Once   pantoprazole  (PROTONIX ) IV  40 mg Intravenous Q12H   sodium chloride  flush  3 mL Intravenous Q12H   tamsulosin   0.4 mg Oral Daily   Continuous Infusions:  sodium chloride  10 mL/hr at 10/27/23 1736   sodium chloride        LOS: 3 days   Reyes IVAR Moores, MD [facility] Office  [phone] Pager - Text Page per Tracey  If 7PM-7AM, please contact night-coverage per [employer] 10/28/2023, 9:56 AM

## 2023-10-28 NOTE — Progress Notes (Signed)
  Progress Note  Patient Name: [name] Williams Date of Encounter: 10/28/2023 [facility] Cardiologist: Kardie Tobb, DO   Interval Summary   No CV complaints. Walked without dyspnea, off O2. Maintaining good UO.  Vital Signs Vitals:   10/27/23 2025 10/27/23 2353 10/28/23 0502 10/28/23 0813  BP: (!) 99/47 129/67 137/67   Pulse: 73 72 77   Resp: 19 18 15    Temp: (!) 97.3 F (36.3 C) 98.1 F (36.7 C) 97.8 F (36.6 C) 97.8 F (36.6 C)  TempSrc: Oral Oral Oral Oral  SpO2: 95% 95% 95%   Weight:      Height:        Intake/Output Summary (Last 24 hours) at 10/28/2023 0955 Last data filed at 10/28/2023 0800 Gross per 24 hour  Intake 303.95 ml  Output 1650 ml  Net -1346.05 ml      10/27/2023   10:41 AM 10/24/2023    9:57 PM 06/30/2023    8:30 AM  Last 3 Weights  Weight (lbs) 265 lb 10.5 oz 282 lb 3 oz 283 lb 1.9 oz  Weight (kg) 120.5 kg 128 kg 128.422 kg      Telemetry/ECG  NSR, occ PACs/PVCs  - Personally Reviewed  Physical Exam  GEN: No acute distress.   Neck: No JVD Cardiac: RRR, no murmurs, rubs, or gallops.  Respiratory: Clear to auscultation bilaterally. GI: Soft, nontender, non-distended  MS: No edema  Assessment & Plan  73 year old with a history of CAD (50% proximal-mid LAD, 60 to 70% proximal RCA, 70% R PLV medically managed), iron  deficiency anemia, HTN, HLP, DM2, history of CVA (on chronic dual antiplatelet therapy), presenting with acute respiratory failure with hypoxia and acute worsening of anemia, requiring transfusion.   CHF compensated. Farxiga  started. Discussed pros/cons and possible side effects of Farxiga . Awaiting completion of GI workup, postponed for tomorrow due to nausea with prep. Decide when to (re)start antiplatelet monotherapy with clopidogrel based on GI workup.   For questions or updates, please contact [facility] Please consult [email or web address] for contact info under         Signed, Jerel Balding, MD

## 2023-10-28 NOTE — H&P (View-Only) (Signed)
 [facility] Progress Note  CC:  Anemia, heme positive stool   Subjective: Patient stated he took one gulp of the bowel prep yesterday evening and did not want to take any more, he was concerned he might vomit.  RN stated patient refused prep. Patient is willing to reattempt a bowel prep today, will receive Zofran  30 minutes before each Suprep dose. He passed a black loose stool yesterday, no BM thus far today. No nausea, vomiting or abdominal pain. No chest pain or shortness of breath. His daughter is at the bedside.   Objective:  Vital signs in last 24 hours: Temp:  [97.3 F (36.3 C)-98.1 F (36.7 C)] 97.8 F (36.6 C) (09/24 0813) Pulse Rate:  [72-77] 77 (09/24 0502) Resp:  [15-19] 15 (09/24 0502) BP: (99-137)/(47-71) 137/67 (09/24 0502) SpO2:  [94 %-95 %] 95 % (09/24 0502) Weight:  [120.5 kg] 120.5 kg (09/23 1041) Last BM Date : 10/27/23 General: Alert fatigued appearing 73 year old male in no acute distress. Heart: Distant S1, S2, no murmurs. Pulm: Breath sounds clear, slightly decreased in the bases bilaterally. Abdomen: Obese abdomen, soft and nontender. Positive bowel sounds to all 4 quadrants. No palpable mass. Extremities:  No significant lower extremity edema.  Dressings to the distal shins dry and intact.  Neurologic:  Alert and oriented x 4.  Speech is clear.  Moves all extremities equally. Psych:  Alert and cooperative. Normal mood and affect.  Intake/Output from previous day: 09/23 0701 - 09/24 0700 In: 394 [P.O.:360; I.V.:34] Out: 2025 [Urine:2025] Intake/Output this shift: No intake/output data recorded.  Lab Results: Recent Labs    10/26/23 0402 10/27/23 0316 10/28/23 0249  WBC 9.4 9.3 9.1  HGB 8.1* 8.2* 8.1*  HCT 27.5* 28.1* 27.6*  PLT 414* 389 354   BMET Recent Labs    10/26/23 0402 10/27/23 0316 10/28/23 0249  NA 137 136 137  K 3.1* 3.5 3.5  CL 98 97* 100  CO2 28 27 25   GLUCOSE 141* 145* 128*  BUN 15 24* 23  CREATININE  1.34* 1.57* 1.27*  CALCIUM  9.4 9.2 9.2   LFT Recent Labs    10/28/23 0249  ALBUMIN 3.4*   PT/INR No results for input(s): LABPROT, INR in the last 72 hours. Hepatitis Panel No results for input(s): HEPBSAG, HCVAB, HEPAIGM, HEPBIGM in the last 72 hours.  No results found.    Assessment / Plan:  73 year old male with a history of gastric and colonic angioectasias, acute on chronic iron  deficiency anemia, heme positive stool in the setting of aspirin  Plavix and meloxicam admitted with melena and symptomatic anemia, demand ischemia, acute on chronic diastolic CHF subsequently improved.   Iron  deficiency anemia/heme positive stool/history of stomach and colonic angioectasias. EGD and colonoscopy scheduled today, however, patient refused bowel prep. Patient willing to try bowel prep today, will schedule Zofran  30 minutes before each bowel prep dose. Hg 8.2 -> 8.1. -- Clear liquid diet -- NPO after midnight -- EGD and colonoscopy tomorrow.  Bowel prep reordered, patient to receive Zofran  4 mg IV 30 minutes before each dose of Suprep -- He has received IV iron  while here and will need to continue this as an outpatient to complete the course -- CBC and BMP in am -- Transfuse for Hg < 8 -- Continue to hold DOAC for now -- Procedures deemed appropriate while here per cardiology -- Continue twice daily PPI  Intermittent, small-volume rectal bleeding with bowel movements. Likely hemorrhoidal. No rectal masses or blood in vault on  DRE 9/21. Colonoscopy 05/2019 showed a small tubular adenomatous polyp removed from the colon and colonic AVMs. -- Colonoscopy as ordered above   Acute on chronic diastolic heart failure (on admission). ECHO 9/21 showed LVEF 50 to 55% with grade 2 diastolic dysfunction.  Received Lasix , diuresed 2 L.   CAD. Elevated Troponins  (felt to be demand ischemia). Troponin 332>> 831.  In setting of anemia with suspected GI bleed.  Cardiology planning on  restarting Plavix after GI evaluation completed.  No angina.   Acute hypoxic respiratory failure (on admission) in setting of heart failure. CTA negative for PE showed evidence of interstitial edema and trace bilateral pleural effusions, R > L.  Weaned off oxygen .  Respiratory status significantly improved after diuresed almost 2 L.  On room air.   Chronic GERD -Continue PPI twice daily   AKI, Cr 1.07 -> 1.34 -> 1.27.   History of CVA, about 6 months ago.  Plavix on hold, last dose 9/20.   DM 2   History of colon polyps Small tubular adenoma April 2021.     Principal Problem:   Acute hypoxemic respiratory failure (HCC) Active Problems:   Essential hypertension   Hyperlipidemia   Gout   BPH (benign prostatic hyperplasia)   DM (diabetes mellitus) with complications (HCC)   Symptomatic anemia   Dyslipidemia   Coronary artery disease involving native coronary artery of native heart without angina pectoris   Hypertension, benign   GI bleed   Demand ischemia (HCC)   History of CVA (cerebrovascular accident)   Obesity (BMI 30-39.9)   Acute diastolic heart failure (HCC)   Angiodysplasia of intestine   Heme positive stool     LOS: 3 days   Elida CHRISTELLA Shawl  10/28/2023, 9:30 AM

## 2023-10-28 NOTE — Progress Notes (Signed)
 [facility] Progress Note  CC:  Anemia, heme positive stool   Subjective: Patient stated he took one gulp of the bowel prep yesterday evening and did not want to take any more, he was concerned he might vomit.  RN stated patient refused prep. Patient is willing to reattempt a bowel prep today, will receive Zofran  30 minutes before each Suprep dose. He passed a black loose stool yesterday, no BM thus far today. No nausea, vomiting or abdominal pain. No chest pain or shortness of breath. His daughter is at the bedside.   Objective:  Vital signs in last 24 hours: Temp:  [97.3 F (36.3 C)-98.1 F (36.7 C)] 97.8 F (36.6 C) (09/24 0813) Pulse Rate:  [72-77] 77 (09/24 0502) Resp:  [15-19] 15 (09/24 0502) BP: (99-137)/(47-71) 137/67 (09/24 0502) SpO2:  [94 %-95 %] 95 % (09/24 0502) Weight:  [120.5 kg] 120.5 kg (09/23 1041) Last BM Date : 10/27/23 General: Alert fatigued appearing 73 year old male in no acute distress. Heart: Distant S1, S2, no murmurs. Pulm: Breath sounds clear, slightly decreased in the bases bilaterally. Abdomen: Obese abdomen, soft and nontender. Positive bowel sounds to all 4 quadrants. No palpable mass. Extremities:  No significant lower extremity edema.  Dressings to the distal shins dry and intact.  Neurologic:  Alert and oriented x 4.  Speech is clear.  Moves all extremities equally. Psych:  Alert and cooperative. Normal mood and affect.  Intake/Output from previous day: 09/23 0701 - 09/24 0700 In: 394 [P.O.:360; I.V.:34] Out: 2025 [Urine:2025] Intake/Output this shift: No intake/output data recorded.  Lab Results: Recent Labs    10/26/23 0402 10/27/23 0316 10/28/23 0249  WBC 9.4 9.3 9.1  HGB 8.1* 8.2* 8.1*  HCT 27.5* 28.1* 27.6*  PLT 414* 389 354   BMET Recent Labs    10/26/23 0402 10/27/23 0316 10/28/23 0249  NA 137 136 137  K 3.1* 3.5 3.5  CL 98 97* 100  CO2 28 27 25   GLUCOSE 141* 145* 128*  BUN 15 24* 23  CREATININE  1.34* 1.57* 1.27*  CALCIUM  9.4 9.2 9.2   LFT Recent Labs    10/28/23 0249  ALBUMIN 3.4*   PT/INR No results for input(s): LABPROT, INR in the last 72 hours. Hepatitis Panel No results for input(s): HEPBSAG, HCVAB, HEPAIGM, HEPBIGM in the last 72 hours.  No results found.    Assessment / Plan:  73 year old male with a history of gastric and colonic angioectasias, acute on chronic iron  deficiency anemia, heme positive stool in the setting of aspirin  Plavix and meloxicam admitted with melena and symptomatic anemia, demand ischemia, acute on chronic diastolic CHF subsequently improved.   Iron  deficiency anemia/heme positive stool/history of stomach and colonic angioectasias. EGD and colonoscopy scheduled today, however, patient refused bowel prep. Patient willing to try bowel prep today, will schedule Zofran  30 minutes before each bowel prep dose. Hg 8.2 -> 8.1. -- Clear liquid diet -- NPO after midnight -- EGD and colonoscopy tomorrow.  Bowel prep reordered, patient to receive Zofran  4 mg IV 30 minutes before each dose of Suprep -- He has received IV iron  while here and will need to continue this as an outpatient to complete the course -- CBC and BMP in am -- Transfuse for Hg < 8 -- Continue to hold DOAC for now -- Procedures deemed appropriate while here per cardiology -- Continue twice daily PPI  Intermittent, small-volume rectal bleeding with bowel movements. Likely hemorrhoidal. No rectal masses or blood in vault on  DRE 9/21. Colonoscopy 05/2019 showed a small tubular adenomatous polyp removed from the colon and colonic AVMs. -- Colonoscopy as ordered above   Acute on chronic diastolic heart failure (on admission). ECHO 9/21 showed LVEF 50 to 55% with grade 2 diastolic dysfunction.  Received Lasix , diuresed 2 L.   CAD. Elevated Troponins  (felt to be demand ischemia). Troponin 332>> 831.  In setting of anemia with suspected GI bleed.  Cardiology planning on  restarting Plavix after GI evaluation completed.  No angina.   Acute hypoxic respiratory failure (on admission) in setting of heart failure. CTA negative for PE showed evidence of interstitial edema and trace bilateral pleural effusions, R > L.  Weaned off oxygen .  Respiratory status significantly improved after diuresed almost 2 L.  On room air.   Chronic GERD -Continue PPI twice daily   AKI, Cr 1.07 -> 1.34 -> 1.27.   History of CVA, about 6 months ago.  Plavix on hold, last dose 9/20.   DM 2   History of colon polyps Small tubular adenoma April 2021.     Principal Problem:   Acute hypoxemic respiratory failure (HCC) Active Problems:   Essential hypertension   Hyperlipidemia   Gout   BPH (benign prostatic hyperplasia)   DM (diabetes mellitus) with complications (HCC)   Symptomatic anemia   Dyslipidemia   Coronary artery disease involving native coronary artery of native heart without angina pectoris   Hypertension, benign   GI bleed   Demand ischemia (HCC)   History of CVA (cerebrovascular accident)   Obesity (BMI 30-39.9)   Acute diastolic heart failure (HCC)   Angiodysplasia of intestine   Heme positive stool     LOS: 3 days   Elida CHRISTELLA Shawl  10/28/2023, 9:30 AM

## 2023-10-29 ENCOUNTER — Encounter (HOSPITAL_COMMUNITY): Payer: Self-pay | Admitting: Internal Medicine

## 2023-10-29 ENCOUNTER — Other Ambulatory Visit (HOSPITAL_COMMUNITY): Payer: Self-pay

## 2023-10-29 ENCOUNTER — Inpatient Hospital Stay (HOSPITAL_COMMUNITY): Payer: Self-pay | Admitting: Anesthesiology

## 2023-10-29 ENCOUNTER — Encounter (HOSPITAL_COMMUNITY)
Admission: EM | Disposition: A | Discharge status: Home / Self Care | Payer: Self-pay | Source: Home / Self Care | Attending: Student

## 2023-10-29 DIAGNOSIS — K5521 Angiodysplasia of colon with hemorrhage: Secondary | ICD-10-CM

## 2023-10-29 DIAGNOSIS — J9601 Acute respiratory failure with hypoxia: Secondary | ICD-10-CM | POA: Diagnosis not present

## 2023-10-29 DIAGNOSIS — I2510 Atherosclerotic heart disease of native coronary artery without angina pectoris: Secondary | ICD-10-CM

## 2023-10-29 DIAGNOSIS — D123 Benign neoplasm of transverse colon: Secondary | ICD-10-CM

## 2023-10-29 DIAGNOSIS — K31819 Angiodysplasia of stomach and duodenum without bleeding: Secondary | ICD-10-CM

## 2023-10-29 DIAGNOSIS — I10 Essential (primary) hypertension: Secondary | ICD-10-CM

## 2023-10-29 DIAGNOSIS — K635 Polyp of colon: Secondary | ICD-10-CM

## 2023-10-29 DIAGNOSIS — K31811 Angiodysplasia of stomach and duodenum with bleeding: Secondary | ICD-10-CM

## 2023-10-29 DIAGNOSIS — K648 Other hemorrhoids: Secondary | ICD-10-CM

## 2023-10-29 HISTORY — PX: ESOPHAGOGASTRODUODENOSCOPY: SHX5428

## 2023-10-29 HISTORY — PX: COLONOSCOPY: SHX5424

## 2023-10-29 LAB — BASIC METABOLIC PANEL WITH GFR
Anion gap: 10 (ref 5–15)
BUN: 18 mg/dL (ref 8–23)
CO2: 25 mmol/L (ref 22–32)
Calcium: 9 mg/dL (ref 8.9–10.3)
Chloride: 101 mmol/L (ref 98–111)
Creatinine, Ser: 1.16 mg/dL (ref 0.61–1.24)
GFR, Estimated: >60 mL/min (ref >60)
Glucose, Bld: 142 mg/dL — ABNORMAL HIGH (ref 70–99)
Potassium: 3.3 mmol/L — ABNORMAL LOW (ref 3.5–5.1)
Sodium: 136 mmol/L (ref 135–145)

## 2023-10-29 LAB — CBC
HCT: 27.7 % — ABNORMAL LOW (ref 39.0–52.0)
Hemoglobin: 7.9 g/dL — ABNORMAL LOW (ref 13.0–17.0)
MCH: 24.8 pg — ABNORMAL LOW (ref 26.0–34.0)
MCHC: 28.5 g/dL — ABNORMAL LOW (ref 30.0–36.0)
MCV: 86.8 fL (ref 80.0–100.0)
Platelets: 318 K/uL (ref 150–400)
RBC: 3.19 MIL/uL — ABNORMAL LOW (ref 4.22–5.81)
RDW: 17.3 % — ABNORMAL HIGH (ref 11.5–15.5)
WBC: 9.2 K/uL (ref 4.0–10.5)
nRBC: 0 % (ref 0.0–0.2)

## 2023-10-29 LAB — GLUCOSE, CAPILLARY
Glucose-Capillary: 152 mg/dL — ABNORMAL HIGH (ref 70–99)
Glucose-Capillary: 156 mg/dL — ABNORMAL HIGH (ref 70–99)
Glucose-Capillary: 152 mg/dL — ABNORMAL HIGH (ref 70–99)
Glucose-Capillary: 140 mg/dL — ABNORMAL HIGH (ref 70–99)

## 2023-10-29 SURGERY — COLONOSCOPY
Anesthesia: Monitor Anesthesia Care

## 2023-10-29 MED ORDER — OXYCODONE HCL 5 MG/5ML PO SOLN: 5.0000 mg | Freq: Once | ORAL | Status: DC | PRN

## 2023-10-29 MED ORDER — SODIUM CHLORIDE 0.9 % IV SOLN: 12.5000 mg | INTRAVENOUS | Status: DC | PRN

## 2023-10-29 MED ORDER — POTASSIUM CHLORIDE CRYS ER 20 MEQ PO TBCR
40.0000 meq | EXTENDED_RELEASE_TABLET | Freq: Once | ORAL | Status: AC
Start: 2023-10-29 — End: 2023-10-29
  Administered 2023-10-29: 40 meq via ORAL
  Filled 2023-10-29: qty 2

## 2023-10-29 MED ORDER — PANTOPRAZOLE SODIUM 40 MG PO TBEC: 40.0000 mg | DELAYED_RELEASE_TABLET | Freq: Two times a day (BID) | ORAL | Status: DC

## 2023-10-29 MED ORDER — OXYCODONE HCL 5 MG PO TABS: 5.0000 mg | ORAL_TABLET | Freq: Once | ORAL | Status: DC | PRN

## 2023-10-29 MED ORDER — GABAPENTIN 300 MG PO CAPS
ORAL_CAPSULE | ORAL | 6 refills | Status: AC
  Filled 2023-10-29: qty 150, 50d supply, fill #0

## 2023-10-29 MED ORDER — LIDOCAINE 2% (20 MG/ML) 5 ML SYRINGE
INTRAMUSCULAR | Status: DC | PRN
  Administered 2023-10-29: 50 mg via INTRAVENOUS

## 2023-10-29 MED ORDER — PHENYLEPHRINE 80 MCG/ML (10ML) SYRINGE FOR IV PUSH (FOR BLOOD PRESSURE SUPPORT)
PREFILLED_SYRINGE | INTRAVENOUS | Status: DC | PRN
  Administered 2023-10-29 (×3): 80 ug via INTRAVENOUS
  Administered 2023-10-29 (×3): 160 ug via INTRAVENOUS

## 2023-10-29 MED ORDER — PHENYLEPHRINE HCL-NACL 20-0.9 MG/250ML-% IV SOLN
INTRAVENOUS | Status: DC | PRN
  Administered 2023-10-29: 50 ug/min via INTRAVENOUS

## 2023-10-29 MED ORDER — ACETAMINOPHEN 325 MG PO TABS: 650.0000 mg | ORAL_TABLET | Freq: Four times a day (QID) | ORAL | Status: AC | PRN

## 2023-10-29 MED ORDER — FUROSEMIDE 40 MG PO TABS
40.0000 mg | ORAL_TABLET | Freq: Every day | ORAL | Status: DC
  Administered 2023-10-29: 40 mg via ORAL
  Filled 2023-10-29: qty 1

## 2023-10-29 MED ORDER — DAPAGLIFLOZIN PROPANEDIOL 10 MG PO TABS
10.0000 mg | ORAL_TABLET | Freq: Every day | ORAL | 2 refills | Status: DC
  Filled 2023-10-29: qty 30, 30d supply, fill #0

## 2023-10-29 MED ORDER — HYDROMORPHONE HCL 1 MG/ML IJ SOLN: 0.2500 mg | INTRAMUSCULAR | Status: DC | PRN

## 2023-10-29 MED ORDER — PROPOFOL 500 MG/50ML IV EMUL
INTRAVENOUS | Status: DC | PRN
  Administered 2023-10-29: 60 mg via INTRAVENOUS
  Administered 2023-10-29: 150 ug/kg/min via INTRAVENOUS
  Administered 2023-10-29: 60 mg via INTRAVENOUS

## 2023-10-29 MED ORDER — POTASSIUM CHLORIDE CRYS ER 20 MEQ PO TBCR: 40.0000 meq | EXTENDED_RELEASE_TABLET | Freq: Every day | ORAL | Status: DC

## 2023-10-29 MED ORDER — FUROSEMIDE 40 MG PO TABS
40.0000 mg | ORAL_TABLET | Freq: Every day | ORAL | 2 refills | Status: DC
  Filled 2023-10-29: qty 30, 30d supply, fill #0

## 2023-10-29 NOTE — Anesthesia Postprocedure Evaluation (Signed)
 Anesthesia Post Note  Patient: [name] Williams  Procedure(s) Performed: COLONOSCOPY EGD (ESOPHAGOGASTRODUODENOSCOPY)     Patient location during evaluation: PACU Anesthesia Type: MAC Level of consciousness: awake and alert Pain management: pain level controlled Vital Signs Assessment: post-procedure vital signs reviewed and stable Respiratory status: spontaneous breathing, nonlabored ventilation and respiratory function stable Cardiovascular status: blood pressure returned to baseline and stable Postop Assessment: no apparent nausea or vomiting Anesthetic complications: no   No notable events documented.  Last Vitals:  Vitals:   10/29/23 1043 10/29/23 1128  BP: 138/69 (!) 112/90  Pulse: 71 76  Resp: 12 19  Temp: 36.5 C 36.5 C  SpO2: 97% 99%    Last Pain:  Vitals:   10/29/23 1128  TempSrc: Oral  PainSc:    Pain Goal: Patients Stated Pain Goal: 0 (10/28/23 2000)                 Butler Levander Pinal

## 2023-10-29 NOTE — Op Note (Signed)
 [facility] Patient Name: [name] Williams Procedure Date : 10/29/2023 MRN: [id number] Attending MD: Gordy CHRISTELLA Starch , MD, [id number] Date of Birth: [birth year] CSN: [id number] Age: 73 Admit Type: Inpatient Procedure:                Colonoscopy Indications:              Heme positive stool, Iron  deficiency anemia                            secondary to chronic blood loss Providers:                Gordy CHRISTELLA. Starch, MD, Heather Ng, RN, Jasmine                            Petiford, Technician, Avelina Harrold BONINE Referring MD:             [facility] Medicines:                Monitored Anesthesia Care Complications:            No immediate complications. Estimated Blood Loss:     Estimated blood loss: none. Procedure:                Pre-Anesthesia Assessment:                           - Prior to the procedure, a History and Physical                            was performed, and patient medications and                            allergies were reviewed. The patient's tolerance of                            previous anesthesia was also reviewed. The risks                            and benefits of the procedure and the sedation                            options and risks were discussed with the patient.                            All questions were answered, and informed consent                            was obtained. Prior Anticoagulants: The patient has                            taken Plavix (clopidogrel), last dose was 5 days                            prior to procedure. ASA Grade Assessment: III - A  patient with severe systemic disease. After                            reviewing the risks and benefits, the patient was                            deemed in satisfactory condition to undergo the                            procedure.                           After obtaining informed consent, the colonoscope                             was passed under direct vision. Throughout the                            procedure, the patient's blood pressure, pulse, and                            oxygen  saturations were monitored continuously. The                            CF-HQ190L ([id number] Olympus colonoscope was                            introduced through the anus and advanced to the                            cecum, identified by appendiceal orifice and                            ileocecal valve. The colonoscopy was performed                            without difficulty. The patient tolerated the                            procedure well. The quality of the bowel                            preparation was adequate. The ileocecal valve,                            appendiceal orifice, and rectum were photographed. Scope In: Scope Out: 10:01:17 AM Scope Withdrawal Time: 0 hours 21 minutes 20 seconds  Findings:      The digital rectal exam was normal.      A single small angioectasia with typical arborization was found in the       cecum. Fulguration to ablate the lesion to prevent bleeding by argon       plasma at 0.5 liters/minute and 20 watts was successful.      A single small angioectasia with bleeding on contact was found in the  proximal ascending colon. Fulguration to stop the bleeding by argon       plasma at 0.5 liters/minute and 20 watts was successful. To prevent       bleeding post-intervention, two hemostatic clips were successfully       placed (MR conditional). Clip manufacturer: [employer]. There was       no bleeding at the end of the maneuver.      A 5 mm polyp was found in the transverse colon. The polyp was sessile.       The polyp was removed with a cold snare. Resection and retrieval were       complete.      Internal hemorrhoids were found during retroflexion. The hemorrhoids       were medium-sized. Impression:               - A single colonic angioectasia. Treated with argon                             plasma coagulation (APC).                           - A single colonic angioectasia. Treated with argon                            plasma coagulation (APC). Clips (MR conditional)                            were placed. Clip manufacturer: [employer].                           - One 5 mm polyp in the transverse colon, removed                            with a cold snare. Resected and retrieved.                           - Internal hemorrhoids. Moderate Sedation:      N/A Recommendation:           - Return patient to hospital ward for ongoing care.                           - Resume previous diet.                           - Continue present medications.                           - Await pathology results.                           - Close attention to Hgb and iron  stores as an                            outpatient. Complete IV iron  course as an  outpatient if not completed while here.                           - Resume Plavix (clopidogrel) at prior dose                            tomorrow. Refer to managing physician for further                            adjustment of therapy. Procedure Code(s):        --- Professional ---                           [phone], 59, Colonoscopy, flexible; with control of                            bleeding, any method                           45385, Colonoscopy, flexible; with removal of                            tumor(s), polyp(s), or other lesion(s) by snare                            technique Diagnosis Code(s):        --- Professional ---                           K55.20, Angiodysplasia of colon without hemorrhage                           D12.3, Benign neoplasm of transverse colon (hepatic                            flexure or splenic flexure)                           K64.8, Other hemorrhoids                           R19.5, Other fecal abnormalities                           D50.0, Iron  deficiency  anemia secondary to blood                            loss (chronic) CPT copyright 2022 American Medical Association. All rights reserved. The codes documented in this report are preliminary and upon coder review may  be revised to meet current compliance requirements. Gordy CHRISTELLA Starch, MD 10/29/2023 10:19:43 AM This report has been signed electronically. Number of Addenda: 0

## 2023-10-29 NOTE — Progress Notes (Signed)
  Progress Note  Patient Name: [name] Williams Date of Encounter: 10/29/2023 [facility] Cardiologist: Kardie Tobb, DO   Interval Summary   Just returned from endoscopy.  Small angiectasia seen in the stomach and in the colon, neither 1 actively bleeding, both treated with laser coagulation.  Vital Signs Vitals:   10/29/23 1015 10/29/23 1030 10/29/23 1043 10/29/23 1128  BP: 110/60 116/75 138/69 (!) 112/90  Pulse: 70 73 71 76  Resp: 18 12 12 19   Temp:  98.6 F (37 C) 97.7 F (36.5 C) 97.7 F (36.5 C)  TempSrc:   Oral Oral  SpO2: 93% 95% 97% 99%  Weight:      Height:        Intake/Output Summary (Last 24 hours) at 10/29/2023 1225 Last data filed at 10/29/2023 1204 Gross per 24 hour  Intake 610 ml  Output 0 ml  Net 610 ml      10/27/2023   10:41 AM 10/24/2023    9:57 PM 06/30/2023    8:30 AM  Last 3 Weights  Weight (lbs) 265 lb 10.5 oz 282 lb 3 oz 283 lb 1.9 oz  Weight (kg) 120.5 kg 128 kg 128.422 kg      Telemetry/ECG  Sinus rhythm- Personally Reviewed  Physical Exam  GEN: No acute distress.   Neck: No JVD Cardiac: \RRR, no murmurs, rubs, or gallops.  Respiratory: Clear to auscultation bilaterally. GI: Soft, nontender, non-distended  MS: No edema  Assessment & Plan  73 year old with a history of CAD (50% proximal-mid LAD, 60 to 70% proximal RCA, 70% R PLV medically managed), iron  deficiency anemia, HTN, HLP, DM2, history of CVA (on chronic dual antiplatelet therapy), presenting with acute respiratory failure with hypoxia and acute worsening of anemia, requiring transfusion.   Endoscopy did not identify active bleeding, but 2 small angiectasia's in the stomach and colon respectively were treated with photocoagulation. Plan to resume clopidogrel tomorrow, per GI recommendations. I have initiated treatment with Farxiga  during this hospitalization, for diastolic heart failure.  Reviewed potential benefits as well as potential side effects with the patient and  his son again today.    For questions or updates, please contact [facility] Please consult [email or web address] for contact info under         Signed, Jerel Balding, MD

## 2023-10-29 NOTE — Discharge Summary (Signed)
 DISCHARGE SUMMARY  [name] Williams  MR#: [id number]  DOB:[birth year]  Date of Admission: 10/24/2023 Date of Discharge: 10/29/2023  Attending Physician:Zymier Rodgers ONEIDA Moores, MD  Patient's ERE:[name], [name] BRAVO, MD  Disposition: D/C home   Follow-up Appts:  Follow-up Information     [name], MD Follow up in 1 week(s).   Specialty: Internal Medicine Contact information: [address] [place] [address] [phone]                 Discharge Diagnoses: Acute hypoxic respiratory failure -pulmonary edema Acute exacerbation of chronic diastolic CHF Acute on chronic blood loss anemia -iron  deficiency Lower GI bleeding - gastric and colonic angioectasias Minor Elevation of troponin - history of CAD History of CVA DM2  AKI History of HTN BPH without LUTS Anxiety/depression Delirium Morbid obesity - Body mass index is 37.05 kg/m.  Initial presentation: 73 year old with a history of chronic diastolic CHF, CVA on Plavix and aspirin , CAD, DM2, HTN, BPH, gout, HLD, obesity, and anxiety/depression who presented to the ER 9/20 with 1 day of acutely worsening shortness of breath associated with a dry cough and also noting intermittent hematochezia for an undisclosed subacute timeframe.  In the ER the patient desaturated to 84% on room air and required 3 L nasal cannula support.  Hemoglobin was found to be 7.0 with hemoccult positive.  The patient was admitted to the acute units with acute hypoxic respiratory failure felt to be related to a CHF exacerbation as well as acute blood loss anemia.   Hospital Course:  Acute hypoxic respiratory failure -pulmonary edema Desaturated to 84% at time of presentation -felt to be due to pulmonary edema/CHF exacerbation -resolved with diuresis with patient titrated back to room air at time of discharge   Acute exacerbation of chronic diastolic CHF Felt to be due to noncompliance with diuretics as well as dietary  noncompliance -TTE noted EF 50-55% and grade 2 DD -has been diuresed with IV Lasix  -respiratory failure has resolved - Farxiga  initiated by cardiology - resume lasix  at new dose of 40mg  every day at discharge   Acute on chronic blood loss anemia -iron  deficiency Aspirin  and Plavix held initially - dosed with IV Venofer  300 mg x 1 - after endoscopic eval, pt was cleared to resume Plavix as of 9/26 - will need to reassess Fe level in outpatient setting to determine if further IV Fe tx is indicated    Lower GI bleeding - hx of gastric and colonic angioectasias GI followed - AVMs of the stomach and colon appreciated on endoscopy with ablation performed -patient tolerated procedure well -cleared to resume Plavix alone 9/26 -counseled patient to avoid NSAIDs   Minor Elevation of troponin - history of CAD No chest pain -no WMA on TTE -LHC 2021 with moderate to severe proximal/mid LAD stenosis as well as tandem 60-70% proximal LCA lesion - ECG unrevealing - has been evaluated by Cardiology    History of CVA No residual deficit -previously on aspirin  and Plavix chronically -continue Lipitor  -DAPT stopped due to GI bleed - plan to resume plavix only on 9/26   DM2  CBG presently controlled - SGLT2 started this admit    AKI Baseline creatinine normal at 1.2 -creatinine increased slightly initially during diuretic therapy but then improved -outpatient monitoring of creatinine with resumption of Lasix  dosing will be indicated   History of HTN Blood pressure was transiently borderline low -amlodipine  and diuretic discontinued with recovery of blood pressure -blood pressure stable  at time of discharge   BPH without LUTS Continue Flomax    Anxiety/depression Continue usual Celexa    Delirium Experiencing nighttime delirium consistent with sundowning   Morbid obesity - Body mass index is 37.05 kg/m.  Allergies as of 10/29/2023       Reactions   Pseudoephedrine Other (See Comments)   Penicillins  Rash   Tetracyclines & Related Rash        Medication List     STOP taking these medications    amLODipine  10 MG tablet Commonly known as: NORVASC    aspirin  EC 81 MG tablet   meloxicam 7.5 MG tablet Commonly known as: MOBIC       TAKE these medications    acetaminophen  325 MG tablet Commonly known as: TYLENOL  Take 2 tablets (650 mg total) by mouth every 6 (six) hours as needed for mild pain (pain score 1-3) or fever (or Fever >/= 101).   allopurinol 300 MG tablet Commonly known as: ZYLOPRIM Take 300 mg by mouth daily.   atorvastatin  80 MG tablet Commonly known as: LIPITOR  Take 80 mg by mouth at bedtime.   BD Pen Needle Nano 2nd Gen 32G X 4 MM Misc Generic drug: Insulin  Pen Needle   cetirizine 10 MG tablet Commonly known as: ZYRTEC Take 10 mg by mouth daily.   citalopram  20 MG tablet Commonly known as: CELEXA  Take 20 mg by mouth daily.   clopidogrel 75 MG tablet Commonly known as: PLAVIX Take 75 mg by mouth daily.   dapagliflozin  propanediol 10 MG Tabs tablet Commonly known as: FARXIGA  Take 1 tablet (10 mg total) by mouth daily. Start taking on: October 30, 2023   docusate sodium 100 MG capsule Commonly known as: COLACE Take 100 mg by mouth daily.   FeroSul 325 (65 Fe) MG tablet Generic drug: ferrous sulfate  TAKE 1 TABLET EVERY OTHER DAY What changed:  how much to take when to take this   furosemide  40 MG tablet Commonly known as: LASIX  Take 1 tablet (40 mg total) by mouth daily. Start taking on: October 30, 2023 What changed:  medication strength how much to take when to take this   gabapentin  300 MG capsule Commonly known as: Neurontin  Take 2 pills (600 mg) in the morning and 1 pill (300 mg) at bedtime   gemfibrozil  600 MG tablet Commonly known as: LOPID  Take 600 mg by mouth 2 (two) times daily before a meal.   isosorbide  mononitrate 30 MG 24 hr tablet Commonly known as: IMDUR  Take 30 mg by mouth daily.   losartan  100 MG  tablet Commonly known as: COZAAR  Take 100 mg by mouth at bedtime.   metFORMIN  1000 MG tablet Commonly known as: GLUCOPHAGE  Take 1 tablet (1,000 mg total) by mouth 2 (two) times daily with a meal.   montelukast  10 MG tablet Commonly known as: SINGULAIR  Take 10 mg by mouth daily.   naphazoline-pheniramine 0.025-0.3 % ophthalmic solution Commonly known as: NAPHCON-A Place 1 drop into both eyes 3 (three) times daily as needed for eye irritation (dry/irritated/allergy eyes.).   nitroGLYCERIN  0.4 MG SL tablet Commonly known as: NITROSTAT  Place 0.4 mg under the tongue every 5 (five) minutes as needed for chest pain.   NovoLOG  FlexPen 100 UNIT/ML FlexPen Generic drug: insulin  aspart Inject 20 Units into the skin in the morning, at noon, and at bedtime.   OMEGA 3 PO Take 2 capsules by mouth daily. Omega XL   pantoprazole  40 MG tablet Commonly known as: PROTONIX  Take 40 mg by mouth daily.  potassium chloride  SA 20 MEQ tablet Commonly known as: KLOR-CON  M Take 20 mEq by mouth daily.   tamsulosin  0.4 MG Caps capsule Commonly known as: FLOMAX  Take 0.4 mg by mouth daily.   Tresiba FlexTouch 100 UNIT/ML FlexTouch Pen Generic drug: insulin  degludec Inject 70 Units into the skin daily.        Day of Discharge BP (!) 112/90 (BP Location: Left Arm)   Pulse 76   Temp 97.7 F (36.5 C) (Oral)   Resp 19   Ht 5' 11 (1.803 m)   Wt 120.5 kg   SpO2 99%   BMI 37.05 kg/m   Physical Exam: General: No acute respiratory distress Lungs: Clear to auscultation bilaterally without wheezes or crackles Cardiovascular: Regular rate and rhythm without murmur gallop or rub normal S1 and S2 Abdomen: Nontender, nondistended, soft, bowel sounds positive, no rebound, no ascites, no appreciable mass Extremities: No significant cyanosis, clubbing, or edema bilateral lower extremities  Basic Metabolic Panel: Recent Labs  Lab 10/25/23 0713 10/26/23 0402 10/27/23 0316 10/28/23 0249  10/29/23 0301  NA 137 137 136 137 136  K 3.6 3.1* 3.5 3.5 3.3*  CL 99 98 97* 100 101  CO2 22 28 27 25 25   GLUCOSE 120* 141* 145* 128* 142*  BUN 14 15 24* 23 18  CREATININE 1.07 1.34* 1.57* 1.27* 1.16  CALCIUM  9.4 9.4 9.2 9.2 9.0  MG 1.7 1.7 2.0 2.2  --   PHOS 3.5  --  4.1 4.1  --     CBC: Recent Labs  Lab 10/24/23 2213 10/25/23 0431 10/25/23 0539 10/26/23 0402 10/27/23 0316 10/28/23 0249 10/29/23 0301  WBC 10.9*  --   --  9.4 9.3 9.1 9.2  HGB 7.0*   < > 8.8* 8.1* 8.2* 8.1* 7.9*  HCT 24.9*   < > 26.0* 27.5* 28.1* 27.6* 27.7*  MCV 87.1  --   --  83.3 84.4 84.4 86.8  PLT 453*  --   --  414* 389 354 318   < > = values in this interval not displayed.    Time spent in discharge (includes decision making & examination of pt): 35 minutes  10/29/2023, 2:40 PM   Reyes IVAR Moores, MD [facility]  [phone]

## 2023-10-29 NOTE — Anesthesia Preprocedure Evaluation (Signed)
 Anesthesia Evaluation  Patient identified by MRN, date of birth, ID band Patient awake    Reviewed: Allergy & Precautions, H&P , NPO status , Patient's Chart, lab work & pertinent test results  Airway Mallampati: II  TM Distance: >3 FB Neck ROM: Full    Dental  (+) Dental Advisory Given   Pulmonary shortness of breath, former smoker   Pulmonary exam normal breath sounds clear to auscultation       Cardiovascular hypertension, Pt. on medications + CAD  Normal cardiovascular exam Rhythm:Regular Rate:Normal     Neuro/Psych   Anxiety Depression    CVA  negative psych ROS   GI/Hepatic negative GI ROS, Neg liver ROS,,,  Endo/Other  negative endocrine ROSdiabetes, Type 2    Renal/GU negative Renal ROS  negative genitourinary   Musculoskeletal  (+) Arthritis , Osteoarthritis,    Abdominal  (+) + obese  Peds negative pediatric ROS (+)  Hematology  (+) Blood dyscrasia, anemia   Anesthesia Other Findings   Reproductive/Obstetrics negative OB ROS                              Anesthesia Physical Anesthesia Plan  ASA: 3  Anesthesia Plan: MAC   Post-op Pain Management:    Induction: Intravenous  PONV Risk Score and Plan: 1 and Propofol  infusion and Treatment may vary due to age or medical condition  Airway Management Planned: Nasal Cannula  Additional Equipment:   Intra-op Plan:   Post-operative Plan:   Informed Consent: I have reviewed the patients History and Physical, chart, labs and discussed the procedure including the risks, benefits and alternatives for the proposed anesthesia with the patient or authorized representative who has indicated his/her understanding and acceptance.     Dental advisory given  Plan Discussed with: CRNA  Anesthesia Plan Comments:         Anesthesia Quick Evaluation

## 2023-10-29 NOTE — Transfer of Care (Signed)
 Immediate Anesthesia Transfer of Care Note  Patient: [name] Williams  Procedure(s) Performed: COLONOSCOPY EGD (ESOPHAGOGASTRODUODENOSCOPY)  Patient Location: PACU  Anesthesia Type:MAC  Level of Consciousness: awake, alert , and oriented  Airway & Oxygen  Therapy: Patient Spontanous Breathing and Patient connected to nasal cannula oxygen   Post-op Assessment: Report given to RN, Post -op Vital signs reviewed and stable, Patient moving all extremities X 4, and Patient able to stick tongue midline  Post vital signs: Reviewed and stable  Last Vitals:  Vitals Value Taken Time  BP 132/54 10/29/23 10:07  Temp 98.6   Pulse 74 10/29/23 10:08  Resp 10 10/29/23 10:08  SpO2 98 % 10/29/23 10:08  Vitals shown include unfiled device data.  Last Pain:  Vitals:   10/29/23 0814  TempSrc: Temporal  PainSc:       Patients Stated Pain Goal: 0 (10/28/23 2000)  Complications: No notable events documented.

## 2023-10-29 NOTE — Interval H&P Note (Signed)
 History and Physical Interval Note: For upper endoscopy and colonoscopy today to evaluate chronic IDA, heme positive stools and past gastric and colonic angioectasias The nature of the procedure, as well as the risks, benefits, and alternatives were carefully and thoroughly reviewed with the patient. Ample time for discussion and questions allowed. The patient understood, was satisfied, and agreed to proceed.      Latest Ref Rng & Units 10/29/2023    3:01 AM 10/28/2023    2:49 AM 10/27/2023    3:16 AM  CBC  WBC 4.0 - 10.5 K/uL 9.2  9.1  9.3   Hemoglobin 13.0 - 17.0 g/dL 7.9  8.1  8.2   Hematocrit 39.0 - 52.0 % 27.7  27.6  28.1   Platelets 150 - 400 K/uL 318  354  389      10/29/2023 8:57 AM  [name]  has presented today for surgery, with the diagnosis of Anemia.  The various methods of treatment have been discussed with the patient and family. After consideration of risks, benefits and other options for treatment, the patient has consented to  Procedure(s): COLONOSCOPY (N/A) EGD (ESOPHAGOGASTRODUODENOSCOPY) (N/A) as a surgical intervention.  The patient's history has been reviewed, patient examined, no change in status, stable for surgery.  I have reviewed the patient's chart and labs.  Questions were answered to the patient's satisfaction.     [name] Williams [name]

## 2023-10-29 NOTE — Op Note (Signed)
 [facility] Patient Name: [name] Williams Procedure Date : 10/29/2023 MRN: [id number] Attending MD: Gordy CHRISTELLA Starch , MD, [id number] Date of Birth: [birth year] CSN: [id number] Age: 73 Admit Type: Inpatient Procedure:                Upper GI endoscopy Indications:              Iron  deficiency anemia secondary to chronic blood                            loss, Heme positive stool Providers:                Gordy CHRISTELLA. Starch, MD, Heather Ng, RN, Jasmine                            Petiford, Technician, Avelina Harrold BONINE Referring MD:             [facility] Medicines:                Monitored Anesthesia Care Complications:            No immediate complications. Estimated Blood Loss:     Estimated blood loss: none. Procedure:                Pre-Anesthesia Assessment:                           - Prior to the procedure, a History and Physical                            was performed, and patient medications and                            allergies were reviewed. The patient's tolerance of                            previous anesthesia was also reviewed. The risks                            and benefits of the procedure and the sedation                            options and risks were discussed with the patient.                            All questions were answered, and informed consent                            was obtained. Prior Anticoagulants: The patient has                            taken Plavix (clopidogrel), last dose was 5 days                            prior to procedure. ASA Grade Assessment: III - A  patient with severe systemic disease. After                            reviewing the risks and benefits, the patient was                            deemed in satisfactory condition to undergo the                            procedure.                           After obtaining informed consent, the endoscope was                             passed under direct vision. Throughout the                            procedure, the patient's blood pressure, pulse, and                            oxygen  saturations were monitored continuously. The                            [id number]-H190 ([id number]) Olympus endoscope was introduced                            through the mouth, and advanced to the second part                            of duodenum. The upper GI endoscopy was                            accomplished without difficulty. The patient                            tolerated the procedure well. Scope In: Scope Out: Findings:      The examined esophagus was normal.      A single 4 mm angioectasia with no bleeding was found in the gastric       body. Fulguration to ablate the lesion to prevent bleeding by argon       plasma at 1 liter/minute and 20 watts was successful. To prevent       bleeding post-intervention, one hemostatic clip was successfully placed       (MR conditional). Clip manufacturer: [employer]. There was no bleeding       during, or at the end, of the procedure.      The exam of the stomach was otherwise normal.      The examined duodenum was normal. Impression:               - Normal esophagus.                           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 Treated  with argon plasma coagulation (APC). Clip (MR                            conditional) was placed post ablation given need to                            resume antiplatelet therapy. Clip manufacturer:                            [employer].                           - Normal examined duodenum.                           - No specimens collected. Moderate Sedation:      N/A Recommendation:           - Return patient to hospital ward for ongoing care.                           - Advance diet as tolerated.                           - Continue present medications.                           - Replace iron  and pay close attention to  iron                             stores and Hgb as an outpatient.                           - See the other procedure note for documentation of                            additional recommendations. Procedure Code(s):        --- Professional ---                           [phone], Esophagogastroduodenoscopy, flexible,                            transoral; with control of bleeding, any method Diagnosis Code(s):        --- Professional ---                           K31.819, Angiodysplasia of stomach and duodenum                            without bleeding                           D50.0, Iron  deficiency anemia secondary to blood                            loss (chronic)  R19.5, Other fecal abnormalities CPT copyright 2022 [employer]. All rights reserved. The codes documented in this report are preliminary and upon coder review may  be revised to meet current compliance requirements. Gordy CHRISTELLA Starch, MD 10/29/2023 10:03:09 AM This report has been signed electronically. Number of Addenda: 0

## 2023-10-30 ENCOUNTER — Encounter (HOSPITAL_COMMUNITY): Payer: Self-pay | Admitting: Internal Medicine

## 2023-10-30 ENCOUNTER — Encounter: Payer: Self-pay | Admitting: Oncology

## 2023-10-30 ENCOUNTER — Other Ambulatory Visit: Payer: Self-pay | Admitting: Oncology

## 2023-10-30 LAB — SURGICAL PATHOLOGY

## 2023-11-03 ENCOUNTER — Ambulatory Visit: Payer: Self-pay | Admitting: Internal Medicine

## 2023-11-12 DIAGNOSIS — I10 Essential (primary) hypertension: Secondary | ICD-10-CM | POA: Diagnosis not present

## 2023-11-12 DIAGNOSIS — I5033 Acute on chronic diastolic (congestive) heart failure: Secondary | ICD-10-CM | POA: Diagnosis not present

## 2023-11-12 DIAGNOSIS — N401 Enlarged prostate with lower urinary tract symptoms: Secondary | ICD-10-CM | POA: Diagnosis not present

## 2023-11-12 DIAGNOSIS — I25118 Atherosclerotic heart disease of native coronary artery with other forms of angina pectoris: Secondary | ICD-10-CM | POA: Diagnosis not present

## 2023-11-12 DIAGNOSIS — E785 Hyperlipidemia, unspecified: Secondary | ICD-10-CM | POA: Diagnosis not present

## 2023-11-12 DIAGNOSIS — E1142 Type 2 diabetes mellitus with diabetic polyneuropathy: Secondary | ICD-10-CM | POA: Diagnosis not present

## 2023-11-12 DIAGNOSIS — G459 Transient cerebral ischemic attack, unspecified: Secondary | ICD-10-CM | POA: Diagnosis not present

## 2023-11-12 DIAGNOSIS — M109 Gout, unspecified: Secondary | ICD-10-CM | POA: Diagnosis not present

## 2023-11-12 DIAGNOSIS — I6523 Occlusion and stenosis of bilateral carotid arteries: Secondary | ICD-10-CM | POA: Diagnosis not present

## 2023-11-12 DIAGNOSIS — D509 Iron deficiency anemia, unspecified: Secondary | ICD-10-CM | POA: Diagnosis not present

## 2023-11-12 DIAGNOSIS — R600 Localized edema: Secondary | ICD-10-CM | POA: Diagnosis not present

## 2023-11-12 DIAGNOSIS — I69354 Hemiplegia and hemiparesis following cerebral infarction affecting left non-dominant side: Secondary | ICD-10-CM | POA: Diagnosis not present

## 2023-11-12 LAB — MICROALBUMIN / CREATININE URINE RATIO
Albumin, Urine POC: >400
Albumin/Creatinine Ratio, Urine, POC: >381
Creatinine, POC: 105 mg/dL

## 2023-11-13 LAB — LAB REPORT - SCANNED
A1c: 6.5
EGFR: 63

## 2023-11-17 DIAGNOSIS — R21 Rash and other nonspecific skin eruption: Secondary | ICD-10-CM | POA: Diagnosis not present

## 2023-11-24 ENCOUNTER — Ambulatory Visit: Attending: Cardiology | Admitting: Cardiology

## 2023-11-25 DIAGNOSIS — L299 Pruritus, unspecified: Secondary | ICD-10-CM | POA: Diagnosis not present

## 2023-11-25 DIAGNOSIS — L300 Nummular dermatitis: Secondary | ICD-10-CM | POA: Diagnosis not present

## 2023-12-11 DIAGNOSIS — D509 Iron deficiency anemia, unspecified: Secondary | ICD-10-CM | POA: Diagnosis not present

## 2023-12-11 DIAGNOSIS — S161XXA Strain of muscle, fascia and tendon at neck level, initial encounter: Secondary | ICD-10-CM | POA: Diagnosis not present

## 2023-12-17 DIAGNOSIS — J208 Acute bronchitis due to other specified organisms: Secondary | ICD-10-CM | POA: Diagnosis not present

## 2023-12-17 DIAGNOSIS — R059 Cough, unspecified: Secondary | ICD-10-CM | POA: Diagnosis not present

## 2023-12-17 DIAGNOSIS — B9689 Other specified bacterial agents as the cause of diseases classified elsewhere: Secondary | ICD-10-CM | POA: Diagnosis not present

## 2023-12-17 DIAGNOSIS — R351 Nocturia: Secondary | ICD-10-CM | POA: Diagnosis not present

## 2023-12-21 ENCOUNTER — Other Ambulatory Visit: Payer: Self-pay | Admitting: Oncology

## 2023-12-21 DIAGNOSIS — D508 Other iron deficiency anemias: Secondary | ICD-10-CM

## 2023-12-21 NOTE — Progress Notes (Unsigned)
 [facility]  [address] [place],  [place]  [address] [phone]  Clinic Day:  12/21/2023  Referring physician: Keren Vicenta BRAVO, MD   HISTORY OF PRESENT ILLNESS:  The patient is a 73 y.o. male with iron  deficiency anemia.  He comes in today to reassess his labs after receiving an abbreviated course of IV iron  in September 2025.  Since receiving his IV iron , the patient claims to be feeling better.  Of note, he had a colonoscopy in September 2025 which revealed small angioectasia, including one in the cecum and the other in his proximal ascending colon.  Another angioectasia was seen in the fundus of his stomach.  All 3 lesions were fulgurated.   He still takes an iron  pill on a daily basis.  He believes he has noticed blood sporadically in his stool over the past few months.  PHYSICAL EXAM:  There were no vitals taken for this visit. Wt Readings from Last 3 Encounters:  10/27/23 265 lb 10.5 oz (120.5 kg)  06/30/23 283 lb 1.9 oz (128.4 kg)  06/22/23 284 lb 12 oz (129.2 kg)   There is no height or weight on file to calculate BMI. Performance status (ECOG): 2 - Symptomatic, <50% confined to bed Physical Exam Constitutional:      Appearance: Normal appearance. He is obese. He is not ill-appearing.  HENT:     Mouth/Throat:     Mouth: Mucous membranes are moist.     Pharynx: Oropharynx is clear. No oropharyngeal exudate or posterior oropharyngeal erythema.  Cardiovascular:     Rate and Rhythm: Normal rate and regular rhythm.     Heart sounds: No murmur heard.    No friction rub. No gallop.  Pulmonary:     Effort: Pulmonary effort is normal. No respiratory distress.     Breath sounds: Normal breath sounds. No wheezing, rhonchi or rales.  Abdominal:     General: Bowel sounds are normal. There is no distension.     Palpations: Abdomen is soft. There is no mass.     Tenderness: There is no abdominal tenderness.  Musculoskeletal:        General:  No swelling.     Right lower leg: No edema.     Left lower leg: No edema.  Lymphadenopathy:     Cervical: No cervical adenopathy.     Upper Body:     Right upper body: No supraclavicular or axillary adenopathy.     Left upper body: No supraclavicular or axillary adenopathy.     Lower Body: No right inguinal adenopathy. No left inguinal adenopathy.  Skin:    General: Skin is warm.     Coloration: Skin is not jaundiced.     Findings: No lesion or rash.  Neurological:     General: No focal deficit present.     Mental Status: He is alert and oriented to person, place, and time. Mental status is at baseline.  Psychiatric:        Mood and Affect: Mood normal.        Behavior: Behavior normal.        Thought Content: Thought content normal.     LABS:       Latest Ref Rng & Units 10/29/2023    3:01 AM 10/28/2023    2:49 AM 10/27/2023    3:16 AM  CBC  WBC 4.0 - 10.5 K/uL 9.2  9.1  9.3   Hemoglobin 13.0 - 17.0 g/dL 7.9  8.1  8.2   Hematocrit 39.0 - 52.0 % 27.7  27.6  28.1   Platelets 150 - 400 K/uL 318  354  389       Latest Ref Rng & Units 10/29/2023    3:01 AM 10/28/2023    2:49 AM 10/27/2023    3:16 AM  CMP  Glucose 70 - 99 mg/dL 857  871  854   BUN 8 - 23 mg/dL 18  23  24    Creatinine 0.61 - 1.24 mg/dL 8.83  8.72  8.42   Sodium 135 - 145 mmol/L 136  137  136   Potassium 3.5 - 5.1 mmol/L 3.3  3.5  3.5   Chloride 98 - 111 mmol/L 101  100  97   CO2 22 - 32 mmol/L 25  25  27    Calcium  8.9 - 10.3 mg/dL 9.0  9.2  9.2     Latest Reference Range & Units 06/15/23 14:41  Iron  45 - 182 ug/dL 65  UIBC ug/dL 431  TIBC 749 - 549 ug/dL 366 (H)  Saturation Ratios 17.9 - 39.5 % 10 (L)  Ferritin 24 - 336 ng/mL 20 (L)  (H): Data is abnormally high (L): Data is abnormally low  ASSESSMENT & PLAN:  A 73 y.o. male with iron  deficiency anemia.  Despite him receiving IV iron  recently, there really was not a significant improvement in his hemoglobin.  Furthermore, his iron  parameters today  show that he is still very iron  deficient.  Based upon this, I will arrange for him to receive another course of IV iron  over these next few weeks.  I will refer this gentleman back to GI as I am concerned there is some GI source behind his persistent iron  deficiency anemia.  Otherwise, I will see this patient back in 4 months for repeat clinical assessment. The patient understands all the plans discussed today and is in agreement with them.  Jordann Grime DELENA Kerns, MD

## 2023-12-22 ENCOUNTER — Other Ambulatory Visit: Payer: Self-pay | Admitting: Oncology

## 2023-12-22 ENCOUNTER — Inpatient Hospital Stay: Attending: Oncology

## 2023-12-22 ENCOUNTER — Inpatient Hospital Stay: Admitting: Oncology

## 2023-12-22 VITALS — BP 84/68 | HR 89 | Temp 98.6°F | Resp 16 | Wt 247.6 lb

## 2023-12-22 DIAGNOSIS — D508 Other iron deficiency anemias: Secondary | ICD-10-CM

## 2023-12-22 DIAGNOSIS — D509 Iron deficiency anemia, unspecified: Secondary | ICD-10-CM | POA: Insufficient documentation

## 2023-12-22 DIAGNOSIS — Z79899 Other long term (current) drug therapy: Secondary | ICD-10-CM | POA: Diagnosis not present

## 2023-12-22 DIAGNOSIS — R4182 Altered mental status, unspecified: Secondary | ICD-10-CM | POA: Diagnosis not present

## 2023-12-22 DIAGNOSIS — D500 Iron deficiency anemia secondary to blood loss (chronic): Secondary | ICD-10-CM

## 2023-12-22 LAB — CBC WITH DIFFERENTIAL (CANCER CENTER ONLY)
Abs Immature Granulocytes: 0.06 K/uL (ref 0.00–0.07)
Basophils Absolute: 0.1 K/uL (ref 0.0–0.1)
Basophils Relative: 1 %
Eosinophils Absolute: 0.6 K/uL — ABNORMAL HIGH (ref 0.0–0.5)
Eosinophils Relative: 6 %
HCT: 34.9 % — ABNORMAL LOW (ref 39.0–52.0)
Hemoglobin: 10.6 g/dL — ABNORMAL LOW (ref 13.0–17.0)
Immature Granulocytes: 1 %
Lymphocytes Relative: 22 %
Lymphs Abs: 2.2 K/uL (ref 0.7–4.0)
MCH: 25.4 pg — ABNORMAL LOW (ref 26.0–34.0)
MCHC: 30.4 g/dL (ref 30.0–36.0)
MCV: 83.7 fL (ref 80.0–100.0)
Monocytes Absolute: 0.9 K/uL (ref 0.1–1.0)
Monocytes Relative: 8 %
Neutro Abs: 6.6 K/uL (ref 1.7–7.7)
Neutrophils Relative %: 62 %
Platelet Count: 472 K/uL — ABNORMAL HIGH (ref 150–400)
RBC: 4.17 MIL/uL — ABNORMAL LOW (ref 4.22–5.81)
RDW: 18.1 % — ABNORMAL HIGH (ref 11.5–15.5)
WBC Count: 10.4 K/uL (ref 4.0–10.5)
nRBC: 0 % (ref 0.0–0.2)

## 2023-12-22 LAB — IRON AND TIBC
Iron: 28 ug/dL — ABNORMAL LOW (ref 45–182)
Saturation Ratios: 6 % — ABNORMAL LOW (ref 17.9–39.5)
TIBC: 483 ug/dL — ABNORMAL HIGH (ref 250–450)
UIBC: 455 ug/dL

## 2023-12-22 LAB — FERRITIN: Ferritin: 54 ng/mL (ref 24–336)

## 2023-12-23 ENCOUNTER — Telehealth: Payer: Self-pay

## 2023-12-23 ENCOUNTER — Encounter: Payer: Self-pay | Admitting: Oncology

## 2023-12-23 DIAGNOSIS — R4181 Age-related cognitive decline: Secondary | ICD-10-CM | POA: Diagnosis not present

## 2023-12-23 DIAGNOSIS — I10 Essential (primary) hypertension: Secondary | ICD-10-CM | POA: Diagnosis not present

## 2023-12-23 DIAGNOSIS — E538 Deficiency of other specified B group vitamins: Secondary | ICD-10-CM | POA: Diagnosis not present

## 2023-12-23 DIAGNOSIS — F0390 Unspecified dementia without behavioral disturbance: Secondary | ICD-10-CM | POA: Diagnosis not present

## 2023-12-23 DIAGNOSIS — R296 Repeated falls: Secondary | ICD-10-CM | POA: Diagnosis not present

## 2023-12-23 NOTE — Telephone Encounter (Signed)
 Dr Ezzard: Let pt know he is still iron  deficient and needs more IV iron . Please schedule. See if pt can get either Monoferric or Feraheme  as he is in a wheelchair, with poor health. Thx all    Latest Reference Range & Units 12/22/23 11:31  Iron  45 - 182 ug/dL 28 (L)  UIBC ug/dL 544  TIBC 749 - 549 ug/dL 516 (H)  Saturation Ratios 17.9 - 39.5 % 6 (L)  Ferritin 24 - 336 ng/mL 54  (L): Data is abnormally low (H): Data is abnormally high

## 2023-12-24 ENCOUNTER — Telehealth: Payer: Self-pay | Admitting: Oncology

## 2023-12-24 NOTE — Telephone Encounter (Signed)
 12/24/23 Spoke with patients grandaughter and she will call back to schedule patient for Monoferric Iron .

## 2023-12-30 DIAGNOSIS — I639 Cerebral infarction, unspecified: Secondary | ICD-10-CM | POA: Diagnosis not present

## 2023-12-30 DIAGNOSIS — F0390 Unspecified dementia without behavioral disturbance: Secondary | ICD-10-CM | POA: Diagnosis not present

## 2023-12-31 DIAGNOSIS — I639 Cerebral infarction, unspecified: Secondary | ICD-10-CM | POA: Diagnosis not present

## 2023-12-31 DIAGNOSIS — N186 End stage renal disease: Secondary | ICD-10-CM | POA: Diagnosis not present

## 2023-12-31 DIAGNOSIS — F0390 Unspecified dementia without behavioral disturbance: Secondary | ICD-10-CM | POA: Diagnosis not present

## 2023-12-31 DIAGNOSIS — F419 Anxiety disorder, unspecified: Secondary | ICD-10-CM | POA: Diagnosis not present

## 2023-12-31 DIAGNOSIS — R4189 Other symptoms and signs involving cognitive functions and awareness: Secondary | ICD-10-CM | POA: Diagnosis not present

## 2024-01-01 DIAGNOSIS — I639 Cerebral infarction, unspecified: Secondary | ICD-10-CM | POA: Diagnosis not present

## 2024-01-01 DIAGNOSIS — I509 Heart failure, unspecified: Secondary | ICD-10-CM | POA: Diagnosis not present

## 2024-01-01 DIAGNOSIS — I130 Hypertensive heart and chronic kidney disease with heart failure and stage 1 through stage 4 chronic kidney disease, or unspecified chronic kidney disease: Secondary | ICD-10-CM | POA: Diagnosis not present

## 2024-01-01 DIAGNOSIS — I6319 Cerebral infarction due to embolism of other precerebral artery: Secondary | ICD-10-CM | POA: Diagnosis not present

## 2024-01-01 DIAGNOSIS — I10 Essential (primary) hypertension: Secondary | ICD-10-CM | POA: Diagnosis not present

## 2024-01-01 DIAGNOSIS — J449 Chronic obstructive pulmonary disease, unspecified: Secondary | ICD-10-CM | POA: Diagnosis not present

## 2024-01-02 DIAGNOSIS — R4189 Other symptoms and signs involving cognitive functions and awareness: Secondary | ICD-10-CM | POA: Diagnosis not present

## 2024-01-02 DIAGNOSIS — F419 Anxiety disorder, unspecified: Secondary | ICD-10-CM | POA: Diagnosis not present

## 2024-01-02 DIAGNOSIS — I639 Cerebral infarction, unspecified: Secondary | ICD-10-CM | POA: Diagnosis not present

## 2024-01-02 DIAGNOSIS — N189 Chronic kidney disease, unspecified: Secondary | ICD-10-CM | POA: Diagnosis not present

## 2024-01-07 ENCOUNTER — Inpatient Hospital Stay: Attending: Oncology

## 2024-01-07 VITALS — BP 125/79 | HR 89 | Temp 97.7°F | Resp 22

## 2024-01-07 DIAGNOSIS — D509 Iron deficiency anemia, unspecified: Secondary | ICD-10-CM | POA: Insufficient documentation

## 2024-01-07 DIAGNOSIS — Z79899 Other long term (current) drug therapy: Secondary | ICD-10-CM | POA: Diagnosis not present

## 2024-01-07 DIAGNOSIS — D500 Iron deficiency anemia secondary to blood loss (chronic): Secondary | ICD-10-CM

## 2024-01-07 MED ORDER — SODIUM CHLORIDE 0.9 % IV SOLN: INTRAVENOUS | Status: DC

## 2024-01-07 MED ORDER — ACETAMINOPHEN 325 MG PO TABS
650.0000 mg | ORAL_TABLET | Freq: Once | ORAL | Status: AC
  Administered 2024-01-07: 650 mg via ORAL
  Filled 2024-01-07: qty 2

## 2024-01-07 MED ORDER — LORATADINE 10 MG PO TABS
10.0000 mg | ORAL_TABLET | Freq: Once | ORAL | Status: AC
  Administered 2024-01-07: 10 mg via ORAL
  Filled 2024-01-07: qty 1

## 2024-01-07 MED ORDER — SODIUM CHLORIDE 0.9 % IV SOLN
1000.0000 mg | Freq: Once | INTRAVENOUS | Status: AC
  Administered 2024-01-07: 1000 mg via INTRAVENOUS
  Filled 2024-01-07: qty 1000

## 2024-01-07 NOTE — Patient Instructions (Signed)

## 2024-01-12 DIAGNOSIS — E785 Hyperlipidemia, unspecified: Secondary | ICD-10-CM | POA: Diagnosis not present

## 2024-01-12 DIAGNOSIS — I6319 Cerebral infarction due to embolism of other precerebral artery: Secondary | ICD-10-CM | POA: Diagnosis not present

## 2024-01-12 DIAGNOSIS — D519 Vitamin B12 deficiency anemia, unspecified: Secondary | ICD-10-CM | POA: Diagnosis not present

## 2024-01-12 DIAGNOSIS — E1142 Type 2 diabetes mellitus with diabetic polyneuropathy: Secondary | ICD-10-CM | POA: Diagnosis not present

## 2024-01-12 DIAGNOSIS — I10 Essential (primary) hypertension: Secondary | ICD-10-CM | POA: Diagnosis not present

## 2024-03-30 ENCOUNTER — Ambulatory Visit: Admitting: Cardiology
